# Patient Record
Sex: Male | Born: 1940 | Race: White | Hispanic: No | Marital: Married | Smoking: Never smoker
Health system: Southern US, Community
[De-identification: ages and names within clinical notes are randomized; demographics above are authoritative.]

## PROBLEM LIST (undated history)

## (undated) DIAGNOSIS — R351 Nocturia: Secondary | ICD-10-CM

## (undated) DIAGNOSIS — E119 Type 2 diabetes mellitus without complications: Secondary | ICD-10-CM

## (undated) DIAGNOSIS — T8859XA Other complications of anesthesia, initial encounter: Secondary | ICD-10-CM

## (undated) DIAGNOSIS — G4733 Obstructive sleep apnea (adult) (pediatric): Secondary | ICD-10-CM

## (undated) DIAGNOSIS — T4145XA Adverse effect of unspecified anesthetic, initial encounter: Secondary | ICD-10-CM

## (undated) DIAGNOSIS — R350 Frequency of micturition: Secondary | ICD-10-CM

## (undated) DIAGNOSIS — I440 Atrioventricular block, first degree: Secondary | ICD-10-CM

## (undated) DIAGNOSIS — G4730 Sleep apnea, unspecified: Secondary | ICD-10-CM

## (undated) DIAGNOSIS — Z5189 Encounter for other specified aftercare: Secondary | ICD-10-CM

## (undated) DIAGNOSIS — M1990 Unspecified osteoarthritis, unspecified site: Secondary | ICD-10-CM

## (undated) DIAGNOSIS — E785 Hyperlipidemia, unspecified: Secondary | ICD-10-CM

## (undated) DIAGNOSIS — Z974 Presence of external hearing-aid: Secondary | ICD-10-CM

## (undated) DIAGNOSIS — N200 Calculus of kidney: Secondary | ICD-10-CM

## (undated) DIAGNOSIS — I5032 Chronic diastolic (congestive) heart failure: Secondary | ICD-10-CM

## (undated) DIAGNOSIS — F4310 Post-traumatic stress disorder, unspecified: Secondary | ICD-10-CM

## (undated) DIAGNOSIS — Z87442 Personal history of urinary calculi: Secondary | ICD-10-CM

## (undated) DIAGNOSIS — I10 Essential (primary) hypertension: Secondary | ICD-10-CM

## (undated) DIAGNOSIS — I5189 Other ill-defined heart diseases: Secondary | ICD-10-CM

## (undated) DIAGNOSIS — Z973 Presence of spectacles and contact lenses: Secondary | ICD-10-CM

## (undated) DIAGNOSIS — I2510 Atherosclerotic heart disease of native coronary artery without angina pectoris: Secondary | ICD-10-CM

## (undated) DIAGNOSIS — N201 Calculus of ureter: Secondary | ICD-10-CM

## (undated) HISTORY — DX: Other ill-defined heart diseases: I51.89

## (undated) HISTORY — DX: Type 2 diabetes mellitus without complications: E11.9

## (undated) HISTORY — DX: Sleep apnea, unspecified: G47.30

## (undated) HISTORY — DX: Encounter for other specified aftercare: Z51.89

## (undated) HISTORY — PX: BLEPHAROPLASTY: SUR158

## (undated) HISTORY — PX: NEPHROSTOMY: SHX1014

## (undated) HISTORY — DX: Atherosclerotic heart disease of native coronary artery without angina pectoris: I25.10

## (undated) HISTORY — DX: Essential (primary) hypertension: I10

## (undated) HISTORY — PX: COLONOSCOPY: SHX174

## (undated) HISTORY — DX: Hyperlipidemia, unspecified: E78.5

## (undated) HISTORY — PX: EXTRACORPOREAL SHOCK WAVE LITHOTRIPSY: SHX1557

## (undated) HISTORY — PX: LITHOTRIPSY: SUR834

## (undated) HISTORY — DX: Obstructive sleep apnea (adult) (pediatric): G47.33

---

## 1996-06-28 DIAGNOSIS — Z955 Presence of coronary angioplasty implant and graft: Secondary | ICD-10-CM

## 1996-06-28 HISTORY — PX: CORONARY ANGIOPLASTY: SHX604

## 1996-06-28 HISTORY — DX: Presence of coronary angioplasty implant and graft: Z95.5

## 2000-07-11 ENCOUNTER — Ambulatory Visit (HOSPITAL_BASED_OUTPATIENT_CLINIC_OR_DEPARTMENT_OTHER): Admission: RE | Admit: 2000-07-11 | Discharge: 2000-07-11 | Payer: Self-pay | Admitting: Surgery

## 2004-04-07 ENCOUNTER — Ambulatory Visit (HOSPITAL_COMMUNITY): Admission: RE | Admit: 2004-04-07 | Discharge: 2004-04-07 | Payer: Self-pay | Admitting: Ophthalmology

## 2006-10-03 ENCOUNTER — Emergency Department (HOSPITAL_COMMUNITY): Admission: EM | Admit: 2006-10-03 | Discharge: 2006-10-03 | Payer: Self-pay | Admitting: Emergency Medicine

## 2006-10-17 ENCOUNTER — Ambulatory Visit: Payer: Self-pay | Admitting: Internal Medicine

## 2006-10-28 ENCOUNTER — Ambulatory Visit: Payer: Self-pay | Admitting: Internal Medicine

## 2011-01-20 ENCOUNTER — Ambulatory Visit: Payer: Self-pay | Admitting: Cardiology

## 2011-10-19 ENCOUNTER — Encounter: Payer: Self-pay | Admitting: *Deleted

## 2012-02-24 ENCOUNTER — Ambulatory Visit: Payer: Self-pay | Admitting: Cardiology

## 2012-03-02 ENCOUNTER — Encounter: Payer: Self-pay | Admitting: *Deleted

## 2012-04-18 ENCOUNTER — Encounter: Payer: Self-pay | Admitting: Cardiology

## 2012-12-26 ENCOUNTER — Telehealth: Payer: Self-pay | Admitting: Cardiology

## 2012-12-26 DIAGNOSIS — I2510 Atherosclerotic heart disease of native coronary artery without angina pectoris: Secondary | ICD-10-CM

## 2012-12-26 NOTE — Telephone Encounter (Signed)
Returned call to patient he stated last time he saw Dr.Jordan he recommended a stress test at next visit.Stated he would like a stress test and office visit in one visit.Patient was told will check with Dr.Jordan tomorrow and call him back.

## 2012-12-26 NOTE — Telephone Encounter (Signed)
New Problem:    Patient called in because he does not want to have his appointment without having a stress test that same day.  Please call back.

## 2013-01-01 NOTE — Telephone Encounter (Signed)
Returned call to patient Dr.Jordan advised needs a stress myoview before his appointment .Schedulers will call back to schedule.

## 2013-01-04 ENCOUNTER — Ambulatory Visit (HOSPITAL_COMMUNITY): Payer: Medicare Other | Attending: Cardiovascular Disease | Admitting: Radiology

## 2013-01-04 VITALS — BP 140/79 | HR 52 | Ht 66.0 in | Wt 178.0 lb

## 2013-01-04 DIAGNOSIS — I4949 Other premature depolarization: Secondary | ICD-10-CM

## 2013-01-04 DIAGNOSIS — Z8249 Family history of ischemic heart disease and other diseases of the circulatory system: Secondary | ICD-10-CM | POA: Insufficient documentation

## 2013-01-04 DIAGNOSIS — Z87891 Personal history of nicotine dependence: Secondary | ICD-10-CM | POA: Insufficient documentation

## 2013-01-04 DIAGNOSIS — I2510 Atherosclerotic heart disease of native coronary artery without angina pectoris: Secondary | ICD-10-CM

## 2013-01-04 DIAGNOSIS — I10 Essential (primary) hypertension: Secondary | ICD-10-CM | POA: Insufficient documentation

## 2013-01-04 DIAGNOSIS — I252 Old myocardial infarction: Secondary | ICD-10-CM | POA: Insufficient documentation

## 2013-01-04 DIAGNOSIS — Z9861 Coronary angioplasty status: Secondary | ICD-10-CM | POA: Insufficient documentation

## 2013-01-04 MED ORDER — REGADENOSON 0.4 MG/5ML IV SOLN
0.4000 mg | Freq: Once | INTRAVENOUS | Status: AC
  Administered 2013-01-04: 0.4 mg via INTRAVENOUS

## 2013-01-04 MED ORDER — TECHNETIUM TC 99M SESTAMIBI GENERIC - CARDIOLITE
10.0000 | Freq: Once | INTRAVENOUS | Status: AC | PRN
  Administered 2013-01-04: 10 via INTRAVENOUS

## 2013-01-04 MED ORDER — TECHNETIUM TC 99M SESTAMIBI GENERIC - CARDIOLITE
30.0000 | Freq: Once | INTRAVENOUS | Status: AC | PRN
  Administered 2013-01-04: 30 via INTRAVENOUS

## 2013-01-04 NOTE — Progress Notes (Signed)
[facility] SITE 3 NUCLEAR MED [address] [place], [place] [address] [phone]    Cardiology Nuclear Med Study  Jeffery Schmidt is a 72 y.o. male     MRN : [id number]     DOB: April 14, 1941  Procedure Date: 01/04/2013  Nuclear Med Background Indication for Stress Test:  Evaluation for Ischemia, PTCA Patency and FAA Physical History:  '98 MI>PTCA; '05 ION:GEXBMW; '10 MPS:no ischemia, EF=68% Cardiac Risk Factors: Family History - CAD, History of Smoking, Hypertension and Lipids  Symptoms:  No cardiac symptoms today.   Nuclear Pre-Procedure Caffeine/Decaff Intake:  None NPO After: 6:30pm   Lungs:  Clear. O2 Sat: 97% on room air. IV 0.9% NS with Angio Cath:  20g  IV Site: R Antecubital  IV Started by:  Stanton Kidney, EMT-P  Chest Size (in):  42 Cup Size: n/a  Height: 5\' 6"  (1.676 m)  Weight:  178 lb (80.74 kg)  BMI:  Body mass index is 28.74 kg/(m^2). Tech Comments:  Atenolol held > 24 hours, per patient.    Nuclear Med Study 1 or 2 day study: 1 day  Stress Test Type:  Treadmill/Lexiscan  Reading MD: Kristeen Miss, MD  Order Authorizing Provider:  Peter Swaziland, MD  Resting Radionuclide: Technetium 20m Sestamibi  Resting Radionuclide Dose: 11.0 mCi   Stress Radionuclide:  Technetium 44m Sestamibi  Stress Radionuclide Dose: 33.0 mCi           Stress Protocol Rest HR: 52 Stress HR: 112  Rest BP: 140/79 Stress BP: 220/69  Exercise Time (min): 10:00 METS: 10.6   Predicted Max HR: 148 bpm % Max HR: 75.68 bpm Rate Pressure Product: 41324   Dose of Adenosine (mg):  n/a Dose of Lexiscan: 0.4 mg  Dose of Atropine (mg): n/a Dose of Dobutamine: n/a mcg/kg/min (at max HR)  Stress Test Technologist: Smiley Houseman, CMA-N  Nuclear Technologist:  Doyne Keel, CNMT     Rest Procedure:  Myocardial perfusion imaging was performed at rest 45 minutes following the intravenous administration of Technetium 68m Sestamibi.  Rest ECG: NSR - Normal EKG  Stress Procedure: The  patient initially walked the treadmill utilizing the Bruce protocol for ten minutes, but was unable to reach his target heart rate.   He was then given IV Lexiscan 0.4 mg over 15-seconds with concurrent low level exercise and then Technetium 20m Sestamibi was injected at 30-seconds while the patient continued walking one more minute.  Quantitative spect images were obtained after a 45-minute delay.  Stress ECG: he developed PVCs.  he developed TWI in the lateral leads with exercise.   QPS Raw Data Images:  Normal; no motion artifact; normal heart/lung ratio. Stress Images:  Normal homogeneous uptake in all areas of the myocardium. Rest Images:  Normal homogeneous uptake in all areas of the myocardium. Subtraction (SDS):  No evidence of ischemia. Transient Ischemic Dilatation (Normal <1.22):  n/a Lung/Heart Ratio (Normal <0.45):  0.37  Quantitative Gated Spect Images QGS EDV:  75 ml QGS ESV:  24 ml  Impression Exercise Capacity:  Lexiscan with low level exercise. BP Response:  Hypertensive blood pressure response. Clinical Symptoms:  No significant symptoms noted. ECG Impression:  He developed mild T wave inversion with exercise  Comparison with Prior Nuclear Study: No significant change from previous study on 10/04/08  Overall Impression:  Low risk stress nuclear study .  He had and abnormal ECG response but this has been seen in the past with him.  the scintographic images show no  ischemia..  LV Ejection Fraction: 68%.  LV Wall Motion:  NL LV Function; NL Wall Motion.   Vesta Mixer, Montez Hageman., MD, [facility] 01/04/2013, 5:22 PM Office - [phone] Pager [phone]

## 2013-01-05 ENCOUNTER — Telehealth: Payer: Self-pay

## 2013-01-05 NOTE — Telephone Encounter (Signed)
Received copy of Nuclear Stress Test from [place].Patient needs copy for FAA.Patient called he will pick up copy on Monday 01/08/13.Copy left at front desk 3 rd floor.

## 2013-01-31 ENCOUNTER — Other Ambulatory Visit: Payer: Self-pay

## 2013-02-21 ENCOUNTER — Ambulatory Visit (INDEPENDENT_AMBULATORY_CARE_PROVIDER_SITE_OTHER): Payer: Medicare Other | Admitting: Cardiology

## 2013-02-21 ENCOUNTER — Encounter: Payer: Self-pay | Admitting: Cardiology

## 2013-02-21 VITALS — BP 133/69 | HR 52 | Ht 66.0 in | Wt 176.0 lb

## 2013-02-21 DIAGNOSIS — E785 Hyperlipidemia, unspecified: Secondary | ICD-10-CM

## 2013-02-21 DIAGNOSIS — I10 Essential (primary) hypertension: Secondary | ICD-10-CM | POA: Insufficient documentation

## 2013-02-21 DIAGNOSIS — I2510 Atherosclerotic heart disease of native coronary artery without angina pectoris: Secondary | ICD-10-CM | POA: Insufficient documentation

## 2013-02-21 DIAGNOSIS — I25119 Atherosclerotic heart disease of native coronary artery with unspecified angina pectoris: Secondary | ICD-10-CM | POA: Insufficient documentation

## 2013-02-21 NOTE — Progress Notes (Signed)
Jeffery Schmidt Date of Birth: April 17, 1941 Medical Record #[id number]  History of Present Illness: Jeffery Schmidt is seen for followup today. He has a history of coronary disease with remote PCI in 1998. He has done very well since then without any recurrent symptoms of chest pain, shortness of breath, orthopnea, palpitations, or dizziness. He remains active. He has a history of hypertension and hyperlipidemia which have been well-controlled. He denies any other new medical problems.   Medication List       This list is accurate as of: 02/21/13  1:46 PM.  Always use your most recent med list.               amLODipine 5 MG tablet  Commonly known as:  NORVASC  Take 5 mg by mouth daily.     aspirin 81 MG tablet  Take 81 mg by mouth daily.     atenolol 100 MG tablet  Commonly known as:  TENORMIN  Take 100 mg by mouth daily.     ezetimibe 10 MG tablet  Commonly known as:  ZETIA  Take 10 mg by mouth daily.     losartan-hydrochlorothiazide 100-25 MG per tablet  Commonly known as:  HYZAAR  Take 1 tablet by mouth daily.     pravastatin 20 MG tablet  Commonly known as:  PRAVACHOL  Take 1/2 tab daily         Allergies  Allergen Reactions  . Epinephrine     Past Medical History  Diagnosis Date  . CAD (coronary artery disease)   . HTN (hypertension)   . HLD (hyperlipidemia)     Past Surgical History  Procedure Laterality Date  . Coronary stent placement  1998  . Kidney surgery      kidney stone- nephrostomy    History  Smoking status  . Never Smoker   Smokeless tobacco  . Not on file    History  Alcohol Use: Not on file    Family History  Problem Relation Age of Onset  . Coronary artery disease    . Heart disease    . Hypertension    . Stroke    . Hypertension Mother     Review of Systems: As noted in history of present illness.  All other systems were reviewed and are negative.  Physical Exam: BP 133/69  Pulse 52  Ht 5\' 6"  (1.676 m)  Wt 176  lb (79.833 kg)  BMI 28.42 kg/m2 He is a pleasant white male in no acute distress. HEENT: Normocephalic, atraumatic. Pupils equal round and reactive. He wears glasses. Oropharynx is clear. Neck is without JVD, adenopathy, thyromegaly, or bruits. Lungs: Clear Cardiovascular: Regular rate and rhythm. Normal S1 and S2. No gallop, murmur, or click. Abdomen: Soft and nontender. No masses or bruits. No pedis thyromegaly. Extremities: No cyanosis or edema. Pulses are 2+ and symmetric. Skin: Warm and dry Neuro: Alert and oriented x3. Cranial nerves II through XII are intact. LABORATORY DATA: ECG: Normal sinus rhythm, normal. Laboratory data dated 04/19/2012: CBC is normal. Complete chemistry panel was normal with exception of a glucose of 121. Total cholesterol 159, trouble stressed to 5, HDL 38, LDL 80. Myoview study performed 01/04/2013 shows normal perfusion with ejection fraction 68%. Assessment / Plan: 1. Coronary disease with remote PCI. Patient is asymptomatic. Recent Myoview study was normal. We will continue current medical therapy with aspirin, beta blocker, and statin therapy. I'll followup again in one year. 2. Hypertension-well-controlled. Continue current antihypertensive therapy. 3. Hyperlipidemia controlled  on pravastatin.

## 2013-02-21 NOTE — Patient Instructions (Addendum)
Continue your current therapy  I will see you in one year   

## 2013-05-03 ENCOUNTER — Other Ambulatory Visit: Payer: Self-pay

## 2013-09-11 ENCOUNTER — Encounter: Payer: Self-pay | Admitting: Internal Medicine

## 2013-09-13 ENCOUNTER — Encounter: Payer: Self-pay | Admitting: Internal Medicine

## 2014-05-06 ENCOUNTER — Inpatient Hospital Stay (HOSPITAL_COMMUNITY)
Admission: EM | Admit: 2014-05-06 | Discharge: 2014-05-09 | DRG: 247 | Disposition: A | Discharge status: Home / Self Care | Payer: Medicare Other | Attending: Internal Medicine | Admitting: Internal Medicine

## 2014-05-06 ENCOUNTER — Encounter (HOSPITAL_COMMUNITY): Payer: Self-pay | Admitting: Emergency Medicine

## 2014-05-06 ENCOUNTER — Emergency Department (HOSPITAL_COMMUNITY): Payer: Medicare Other

## 2014-05-06 DIAGNOSIS — I25110 Atherosclerotic heart disease of native coronary artery with unstable angina pectoris: Secondary | ICD-10-CM | POA: Diagnosis not present

## 2014-05-06 DIAGNOSIS — E876 Hypokalemia: Secondary | ICD-10-CM | POA: Diagnosis not present

## 2014-05-06 DIAGNOSIS — Z955 Presence of coronary angioplasty implant and graft: Secondary | ICD-10-CM

## 2014-05-06 DIAGNOSIS — R079 Chest pain, unspecified: Secondary | ICD-10-CM

## 2014-05-06 DIAGNOSIS — I059 Rheumatic mitral valve disease, unspecified: Secondary | ICD-10-CM

## 2014-05-06 DIAGNOSIS — R4182 Altered mental status, unspecified: Secondary | ICD-10-CM | POA: Insufficient documentation

## 2014-05-06 DIAGNOSIS — Z79899 Other long term (current) drug therapy: Secondary | ICD-10-CM

## 2014-05-06 DIAGNOSIS — H9190 Unspecified hearing loss, unspecified ear: Secondary | ICD-10-CM | POA: Diagnosis present

## 2014-05-06 DIAGNOSIS — E785 Hyperlipidemia, unspecified: Secondary | ICD-10-CM | POA: Diagnosis present

## 2014-05-06 DIAGNOSIS — Z8249 Family history of ischemic heart disease and other diseases of the circulatory system: Secondary | ICD-10-CM

## 2014-05-06 DIAGNOSIS — I2510 Atherosclerotic heart disease of native coronary artery without angina pectoris: Secondary | ICD-10-CM | POA: Diagnosis present

## 2014-05-06 DIAGNOSIS — I25119 Atherosclerotic heart disease of native coronary artery with unspecified angina pectoris: Secondary | ICD-10-CM | POA: Diagnosis present

## 2014-05-06 DIAGNOSIS — R410 Disorientation, unspecified: Secondary | ICD-10-CM | POA: Diagnosis not present

## 2014-05-06 DIAGNOSIS — Z87891 Personal history of nicotine dependence: Secondary | ICD-10-CM

## 2014-05-06 DIAGNOSIS — Z7902 Long term (current) use of antithrombotics/antiplatelets: Secondary | ICD-10-CM

## 2014-05-06 DIAGNOSIS — I200 Unstable angina: Secondary | ICD-10-CM | POA: Diagnosis present

## 2014-05-06 DIAGNOSIS — R001 Bradycardia, unspecified: Secondary | ICD-10-CM | POA: Diagnosis not present

## 2014-05-06 DIAGNOSIS — Z823 Family history of stroke: Secondary | ICD-10-CM

## 2014-05-06 DIAGNOSIS — R51 Headache: Secondary | ICD-10-CM | POA: Diagnosis not present

## 2014-05-06 DIAGNOSIS — R451 Restlessness and agitation: Secondary | ICD-10-CM | POA: Diagnosis not present

## 2014-05-06 DIAGNOSIS — Z7982 Long term (current) use of aspirin: Secondary | ICD-10-CM

## 2014-05-06 DIAGNOSIS — I10 Essential (primary) hypertension: Secondary | ICD-10-CM | POA: Diagnosis present

## 2014-05-06 DIAGNOSIS — E1169 Type 2 diabetes mellitus with other specified complication: Secondary | ICD-10-CM | POA: Diagnosis present

## 2014-05-06 DIAGNOSIS — E1165 Type 2 diabetes mellitus with hyperglycemia: Secondary | ICD-10-CM | POA: Diagnosis present

## 2014-05-06 HISTORY — DX: Calculus of kidney: N20.0

## 2014-05-06 LAB — CBC
HEMATOCRIT: 40.8 % (ref 39.0–52.0)
HEMOGLOBIN: 14.5 g/dL (ref 13.0–17.0)
MCH: 32.2 pg (ref 26.0–34.0)
MCHC: 35.5 g/dL (ref 30.0–36.0)
MCV: 90.5 fL (ref 78.0–100.0)
Platelets: 185 10*3/uL (ref 150–400)
RBC: 4.51 MIL/uL (ref 4.22–5.81)
RDW: 12.7 % (ref 11.5–15.5)
WBC: 6 10*3/uL (ref 4.0–10.5)

## 2014-05-06 LAB — PROTIME-INR
INR: 0.9 (ref 0.00–1.49)
Prothrombin Time: 12.2 seconds (ref 11.6–15.2)

## 2014-05-06 LAB — TROPONIN I: Troponin I: <0.3 ng/mL (ref <0.30)

## 2014-05-06 LAB — BASIC METABOLIC PANEL
Anion gap: 16 — ABNORMAL HIGH (ref 5–15)
BUN: 22 mg/dL (ref 6–23)
CHLORIDE: 101 meq/L (ref 96–112)
CO2: 22 meq/L (ref 19–32)
Calcium: 9.2 mg/dL (ref 8.4–10.5)
Creatinine, Ser: 1 mg/dL (ref 0.50–1.35)
GFR calc Af Amer: 83 mL/min — ABNORMAL LOW (ref >90)
GFR, EST NON AFRICAN AMERICAN: 72 mL/min — AB (ref >90)
GLUCOSE: 214 mg/dL — AB (ref 70–99)
POTASSIUM: 3.7 meq/L (ref 3.7–5.3)
Sodium: 139 mEq/L (ref 137–147)

## 2014-05-06 LAB — HEMOGLOBIN A1C
Hgb A1c MFr Bld: 7.2 % — ABNORMAL HIGH (ref <5.7)
Mean Plasma Glucose: 160 mg/dL — ABNORMAL HIGH (ref <117)

## 2014-05-06 LAB — D-DIMER, QUANTITATIVE: D-Dimer, Quant: 0.33 ug/mL-FEU (ref 0.00–0.48)

## 2014-05-06 MED ORDER — SODIUM CHLORIDE 0.9 % IV SOLN: 250.0000 mL | INTRAVENOUS | Status: DC | PRN

## 2014-05-06 MED ORDER — LOSARTAN POTASSIUM 50 MG PO TABS
100.0000 mg | ORAL_TABLET | Freq: Every day | ORAL | Status: DC
Start: 2014-05-06 — End: 2014-05-06
  Administered 2014-05-06: 100 mg via ORAL
  Filled 2014-05-06: qty 2

## 2014-05-06 MED ORDER — ENOXAPARIN SODIUM 40 MG/0.4ML ~~LOC~~ SOLN
40.0000 mg | SUBCUTANEOUS | Status: DC
  Administered 2014-05-06: 40 mg via SUBCUTANEOUS
  Filled 2014-05-06 (×2): qty 0.4

## 2014-05-06 MED ORDER — ASPIRIN EC 81 MG PO TBEC
81.0000 mg | DELAYED_RELEASE_TABLET | Freq: Every day | ORAL | Status: DC
  Administered 2014-05-06: 81 mg via ORAL
  Filled 2014-05-06: qty 1

## 2014-05-06 MED ORDER — ASPIRIN 325 MG PO TABS
325.0000 mg | ORAL_TABLET | Freq: Once | ORAL | Status: AC
  Administered 2014-05-06: 325 mg via ORAL
  Filled 2014-05-06: qty 1

## 2014-05-06 MED ORDER — NITROGLYCERIN 0.4 MG SL SUBL: 0.4000 mg | SUBLINGUAL_TABLET | SUBLINGUAL | Status: DC | PRN

## 2014-05-06 MED ORDER — SODIUM CHLORIDE 0.9 % IJ SOLN: 3.0000 mL | INTRAMUSCULAR | Status: DC | PRN

## 2014-05-06 MED ORDER — ONDANSETRON HCL 4 MG/2ML IJ SOLN
4.0000 mg | Freq: Four times a day (QID) | INTRAMUSCULAR | Status: DC | PRN
  Administered 2014-05-07: 22:00:00 4 mg via INTRAVENOUS
  Filled 2014-05-06: qty 2

## 2014-05-06 MED ORDER — EZETIMIBE 10 MG PO TABS
10.0000 mg | ORAL_TABLET | Freq: Every day | ORAL | Status: DC
Start: 2014-05-06 — End: 2014-05-09
  Administered 2014-05-06 – 2014-05-09 (×3): 10 mg via ORAL
  Filled 2014-05-06 (×3): qty 1

## 2014-05-06 MED ORDER — ACETAMINOPHEN 325 MG PO TABS
650.0000 mg | ORAL_TABLET | ORAL | Status: DC | PRN
  Administered 2014-05-07: 650 mg via ORAL
  Filled 2014-05-06: qty 2

## 2014-05-06 MED ORDER — LOSARTAN POTASSIUM-HCTZ 100-25 MG PO TABS: 1.0000 | ORAL_TABLET | Freq: Every day | ORAL | Status: DC

## 2014-05-06 MED ORDER — ASPIRIN 81 MG PO CHEW
81.0000 mg | CHEWABLE_TABLET | ORAL | Status: AC
  Administered 2014-05-07: 81 mg via ORAL
  Filled 2014-05-06: qty 1

## 2014-05-06 MED ORDER — HEPARIN (PORCINE) IN NACL 100-0.45 UNIT/ML-% IJ SOLN
1000.0000 [IU]/h | INTRAMUSCULAR | Status: DC
  Administered 2014-05-06: 1000 [IU]/h via INTRAVENOUS
  Filled 2014-05-06 (×2): qty 250

## 2014-05-06 MED ORDER — NITROGLYCERIN 2 % TD OINT
0.5000 [in_us] | TOPICAL_OINTMENT | Freq: Four times a day (QID) | TRANSDERMAL | Status: DC
  Administered 2014-05-06 – 2014-05-07 (×4): 0.5 [in_us] via TOPICAL
  Filled 2014-05-06: qty 30

## 2014-05-06 MED ORDER — SODIUM CHLORIDE 0.9 % IV SOLN
1.0000 mL/kg/h | INTRAVENOUS | Status: DC
  Administered 2014-05-07 (×2): 1 mL/kg/h via INTRAVENOUS

## 2014-05-06 MED ORDER — ATENOLOL 100 MG PO TABS
100.0000 mg | ORAL_TABLET | Freq: Every day | ORAL | Status: DC
  Administered 2014-05-06 – 2014-05-09 (×4): 100 mg via ORAL
  Filled 2014-05-06: qty 1
  Filled 2014-05-06: qty 2
  Filled 2014-05-06: qty 1
  Filled 2014-05-06: qty 4

## 2014-05-06 MED ORDER — AMLODIPINE BESYLATE 5 MG PO TABS
5.0000 mg | ORAL_TABLET | Freq: Every day | ORAL | Status: DC
  Administered 2014-05-06 – 2014-05-09 (×4): 5 mg via ORAL
  Filled 2014-05-06 (×4): qty 1

## 2014-05-06 MED ORDER — MORPHINE SULFATE 2 MG/ML IJ SOLN: 2.0000 mg | INTRAMUSCULAR | Status: DC | PRN

## 2014-05-06 MED ORDER — ASPIRIN 325 MG PO TABS
325.0000 mg | ORAL_TABLET | Freq: Every day | ORAL | Status: DC
  Administered 2014-05-07: 325 mg via ORAL
  Filled 2014-05-06: qty 1

## 2014-05-06 MED ORDER — SODIUM CHLORIDE 0.9 % IJ SOLN
3.0000 mL | Freq: Two times a day (BID) | INTRAMUSCULAR | Status: DC
  Administered 2014-05-06: 3 mL via INTRAVENOUS

## 2014-05-06 MED ORDER — PRAVASTATIN SODIUM 20 MG PO TABS
10.0000 mg | ORAL_TABLET | Freq: Every day | ORAL | Status: DC
  Filled 2014-05-06: qty 1
  Filled 2014-05-06: qty 0.5
  Filled 2014-05-06 (×2): qty 1

## 2014-05-06 MED ORDER — HYDROCHLOROTHIAZIDE 25 MG PO TABS
25.0000 mg | ORAL_TABLET | Freq: Every day | ORAL | Status: DC
  Administered 2014-05-06 – 2014-05-09 (×3): 25 mg via ORAL
  Filled 2014-05-06 (×5): qty 1

## 2014-05-06 NOTE — ED Provider Notes (Signed)
CSN: [id number]     Arrival date & time 05/06/14  0305 History   First MD Initiated Contact with Patient 05/06/14 0310     Chief Complaint  Patient presents with  . Chest Pain     (Consider location/radiation/quality/duration/timing/severity/associated sxs/prior Treatment) HPI  Jeffery Schmidt is a 73 y.o. male with past medical history of coronary artery disease, hypertension, hyperlipidemia coming in with chest pain. Patient stateschest pain is a burning sensation that his mid sternum. This began 2 days ago after eating food and he thought it was related to that. However yesterday when he went on his walk he again had episode of pain. Patient noticed that every time he exerted himself the pain came back, it was relieved with rest. He denied any shortness of breath vomiting or diaphoresis associated with it. He states this is exactly how he felt when he required an angioplasty 10 years ago. He denies any recent fevers or productive cough, chills. Has no further complaints.  10 Systems reviewed and are negative for acute change except as noted in the HPI.     Past Medical History  Diagnosis Date  . CAD (coronary artery disease)   . HTN (hypertension)   . HLD (hyperlipidemia)   . Kidney stones    Past Surgical History  Procedure Laterality Date  . Coronary stent placement  1998  . Kidney surgery      kidney stone- nephrostomy   Family History  Problem Relation Age of Onset  . Coronary artery disease    . Heart disease    . Hypertension    . Stroke    . Hypertension Mother    History  Substance Use Topics  . Smoking status: Never Smoker   . Smokeless tobacco: Not on file  . Alcohol Use: No    Review of Systems    Allergies  Epinephrine  Home Medications   Prior to Admission medications   Medication Sig Start Date End Date Taking? Authorizing Provider  amLODipine (NORVASC) 5 MG tablet Take 5 mg by mouth daily.    Historical Provider, MD  aspirin 81 MG tablet  Take 81 mg by mouth daily.    Historical Provider, MD  atenolol (TENORMIN) 100 MG tablet Take 100 mg by mouth daily.    Historical Provider, MD  ezetimibe (ZETIA) 10 MG tablet Take 10 mg by mouth daily.    Historical Provider, MD  losartan-hydrochlorothiazide (HYZAAR) 100-25 MG per tablet Take 1 tablet by mouth daily.    Historical Provider, MD  pravastatin (PRAVACHOL) 20 MG tablet Take 1/2 tab daily    Historical Provider, MD   BP 147/70 mmHg  Pulse 60  Temp(Src) 97.4 F (36.3 C) (Oral)  Resp 16  Ht 5\' 6"  (1.676 m)  Wt 179 lb (81.194 kg)  BMI 28.91 kg/m2  SpO2 96% Physical Exam  Constitutional: He is oriented to person, place, and time. Vital signs are normal. He appears well-developed and well-nourished.  Non-toxic appearance. He does not appear ill. No distress.  HENT:  Head: Normocephalic and atraumatic.  Nose: Nose normal.  Mouth/Throat: Oropharynx is clear and moist. No oropharyngeal exudate.  Eyes: Conjunctivae and EOM are normal. Pupils are equal, round, and reactive to light. No scleral icterus.  Neck: Normal range of motion. Neck supple. No tracheal deviation, no edema, no erythema and normal range of motion present. No thyroid mass and no thyromegaly present.  Cardiovascular: Normal rate, regular rhythm, S1 normal, S2 normal, normal heart sounds, intact distal pulses  and normal pulses.  Exam reveals no gallop and no friction rub.   No murmur heard. Pulses:      Radial pulses are 2+ on the right side, and 2+ on the left side.       Dorsalis pedis pulses are 2+ on the right side, and 2+ on the left side.  Pulmonary/Chest: Effort normal and breath sounds normal. No respiratory distress. He has no wheezes. He has no rhonchi. He has no rales.  Abdominal: Soft. Normal appearance and bowel sounds are normal. He exhibits no distension, no ascites and no mass. There is no hepatosplenomegaly. There is no tenderness. There is no rebound, no guarding and no CVA tenderness.   Musculoskeletal: Normal range of motion. He exhibits no edema or tenderness.  Lymphadenopathy:    He has no cervical adenopathy.  Neurological: He is alert and oriented to person, place, and time. He has normal strength. No cranial nerve deficit or sensory deficit. He exhibits normal muscle tone. GCS eye subscore is 4. GCS verbal subscore is 5. GCS motor subscore is 6.  Skin: Skin is warm, dry and intact. No petechiae and no rash noted. He is not diaphoretic. No erythema. No pallor.  Psychiatric: He has a normal mood and affect. His behavior is normal. Judgment normal.  Nursing note and vitals reviewed.   ED Course  Procedures (including critical care time) Labs Review Labs Reviewed  BASIC METABOLIC PANEL - Abnormal; Notable for the following:    Glucose, Bld 214 (*)    GFR calc non Af Amer 72 (*)    GFR calc Af Amer 83 (*)    Anion gap 16 (*)    All other components within normal limits  CBC  PROTIME-INR  TROPONIN I  I-STAT TROPOININ, ED  I-STAT TROPOININ, ED    Imaging Review Dg Chest 2 View  05/06/2014   CLINICAL DATA:  Burning of right chest pain.  EXAM: CHEST  2 VIEW  COMPARISON:  PA and lateral chest 10/03/2006.  FINDINGS: Lung volumes are low but the lungs are clear with a punctate calcified granuloma in the right middle lobe unchanged. Heart size is normal. No pneumothorax or pleural effusion.  IMPRESSION: No acute finding in a low volume chest.   Electronically Signed   By: Inge Rise M.D.   On: 05/06/2014 03:54     EKG Interpretation None    MUSE not working: NSR, rate 61,1st degree A-V block, poor R-wave progression, abnormal EKG, no significant change since last tracing.  MDM   Final diagnoses:  Chest pain    Patient presents emergency department out of concern for pain. Although his story is very atypical, this can still be ACS especially in the early patient. He also states this feels just like his prior need for angioplasty. Full dose aspirin was  given.Emergency workup is negative, patient will be admitted to [facility] hospitalist for continued treatment.  Troponin is negative, patient will be admitted to hospitalist service, tele, for continued treatment.   Everlene Balls, MD 05/06/14 0500

## 2014-05-06 NOTE — ED Notes (Signed)
No chest

## 2014-05-06 NOTE — ED Notes (Signed)
Pt handed an urinal to use
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
## 2014-05-06 NOTE — Progress Notes (Addendum)
Pt seen and examined, admitted earlier this am per Dr.Patel with intermittent chest pain with exertion, h/o CAD and PTCA in 1998 and normal myoview in 7/14 Remains NPO, Cards consulted this am Cardiac enzymes and d dimer negative Hyperglycemia, no h/o DM, check hbaic  Domenic Polite, MD [phone]

## 2014-05-06 NOTE — ED Notes (Signed)
To x-ray

## 2014-05-06 NOTE — Progress Notes (Signed)
  Echocardiogram 2D Echocardiogram has been performed.  Lysle Rubens 05/06/2014, 9:24 AM

## 2014-05-06 NOTE — ED Notes (Signed)
The pt returned from xray.  Wife at the bedside.  Aspirin given.  No distress

## 2014-05-06 NOTE — ED Notes (Signed)
The pt has had rt sided chest pain he describes as a burning sensation for 2-3 days.  Worse with ambulation.  His pain increased   While being walked back to a treatment room

## 2014-05-06 NOTE — ED Notes (Signed)
Pt remains monitored by blood pressure, pulse ox, and 12 lead. Pts family remains at bedside.  

## 2014-05-06 NOTE — ED Notes (Signed)
When pt falls asleep on back, has periods of apnea-- alarms on, family and patient discussed their concerns, requesting sleep study at some point-- encouraged to ask admitting doctor.

## 2014-05-06 NOTE — ED Notes (Signed)
Pt remains monitored by blood pressure, pulse ox, and 5 lead. Pts family remains at bedside.  

## 2014-05-06 NOTE — ED Notes (Signed)
Pt has no pain   Waiting on a bed assignment

## 2014-05-06 NOTE — ED Notes (Signed)
Waiting for lab results.

## 2014-05-06 NOTE — ED Notes (Signed)
Pharmacy contacted for daily medications

## 2014-05-06 NOTE — Progress Notes (Signed)
ANTICOAGULATION CONSULT NOTE - Initial Consult  Pharmacy Consult for heparin Indication: chest pain/ACS  Allergies  Allergen Reactions  . Epinephrine     unknown    Patient Measurements: Height: 5\' 6"  (167.6 cm) Weight: 179 lb (81.194 kg) IBW/kg (Calculated) : 63.8 Heparin Dosing Weight: 74kg  Vital Signs: Temp: 98 F (36.7 C) (11/09 0640) BP: 132/69 mmHg (11/09 1547) Pulse Rate: 56 (11/09 1547)  Labs:  Recent Labs  05/06/14 0318 05/06/14 0334 05/06/14 0407 05/06/14 1055  HGB 14.5  --   --   --   HCT 40.8  --   --   --   PLT 185  --   --   --   LABPROT  --  12.2  --   --   INR  --  0.90  --   --   CREATININE 1.00  --   --   --   TROPONINI  --   --  <0.30 <0.30    Estimated Creatinine Clearance: 64.9 mL/min (by C-G formula based on Cr of 1).   Medical History: Past Medical History  Diagnosis Date  . CAD (coronary artery disease)     a. 1998 s/p PCI;  b. 12/2012 MV: EF 68%, no ischemia.  [name] HTN (hypertension)   . HLD (hyperlipidemia)   . Kidney stones    Assessment: 73 year old male admitted for chest pain. He has a PMH of CAD, hypertension, and hyperlipidemia and is being treated accordingly including beta blocker, statin, aspirin. Cardiac enzymes appear to be normal x2. Sq lovenox given this am, new orders to start IV heparin for possible ACS. Patient is not on anticoagulants pta. CBC was normal this am.   Goal of Therapy:  Heparin level 0.3-0.7 units/ml Monitor platelets by anticoagulation protocol: Yes   Plan:  No heparin bolus with lovenox given this morning Start heparin infusion at 1000 units/hr Check anti-Xa level in 8 hours and daily while on heparin Continue to monitor H&H and platelets  Erin Hearing PharmD., BCPS Clinical Pharmacist Pager [phone] 05/06/2014 4:17 PM

## 2014-05-06 NOTE — Progress Notes (Signed)
UR completed 

## 2014-05-06 NOTE — H&P (Signed)
[facility] History and Physical  Patient: Jeffery Schmidt  TGP:[id number]  DOB: Jul 15, 1940  DOS: the patient was seen and examined on 05/06/2014 PCP: Criselda Peaches, MD  Chief Complaint: chest pain  HPI: Jeffery Schmidt is a 73 y.o. male with Past medical history of CAD, HTN, HLD. The patient is presenting with complaints of chest pain. He mentions that he was in [place] Saturday and was driving back to [place] at which time he started having complaints of right-sided burning chest pain after having the  Meal. He mentions that the pain occurred on and off throughout the day on Saturday as well as and Sunday. And since it reoccurred on Sunday he decided to come to the hospital for further workup. The pain he describes as substernal 2 on the right side feels like burning crushing pain associated with exertion and improved with rest. He denies any nausea vomiting fever or chills shortness of breath diarrhea constipation abdominal pain and acid reflux. He has always had some swelling in his legs but denies any leg tenderness. No recent change in his medication reported.  The patient is coming from home And at his baseline independent for most of his ADL.  Review of Systems: as mentioned in the history of present illness.  A Comprehensive review of the other systems is negative.  Past Medical History  Diagnosis Date  . CAD (coronary artery disease)   . HTN (hypertension)   . HLD (hyperlipidemia)   . Kidney stones    Past Surgical History  Procedure Laterality Date  . Coronary stent placement  1998  . Kidney surgery      kidney stone- nephrostomy   Social History:  reports that he has never smoked. He does not have any smokeless tobacco history on file. He reports that he does not drink alcohol. His drug history is not on file.  Allergies  Allergen Reactions  . Epinephrine     unknown    Family History  Problem Relation Age of Onset  . Coronary artery  disease    . Heart disease    . Hypertension    . Stroke    . Hypertension Mother     Prior to Admission medications   Medication Sig Start Date End Date Taking? Authorizing Provider  amLODipine (NORVASC) 5 MG tablet Take 5 mg by mouth daily.   Yes Historical Provider, MD  Ascorbic Acid (VITAMIN C PO) Take 1 tablet by mouth daily.   Yes Historical Provider, MD  aspirin 81 MG tablet Take 81 mg by mouth daily.   Yes Historical Provider, MD  atenolol (TENORMIN) 100 MG tablet Take 100 mg by mouth daily.   Yes Historical Provider, MD  ezetimibe (ZETIA) 10 MG tablet Take 10 mg by mouth daily.   Yes Historical Provider, MD  Glucos-Chondroit-Hyaluron-MSM (GLUCOSAMINE CHONDROITIN JOINT PO) Take 1 tablet by mouth daily.   Yes Historical Provider, MD  losartan-hydrochlorothiazide (HYZAAR) 100-25 MG per tablet Take 1 tablet by mouth daily.   Yes Historical Provider, MD  omega-3 acid ethyl esters (LOVAZA) 1 G capsule Take 1 g by mouth daily.   Yes Historical Provider, MD  pravastatin (PRAVACHOL) 20 MG tablet Take 10 mg by mouth daily.   Yes Historical Provider, MD  pravastatin (PRAVACHOL) 20 MG tablet Take 1/2 tab daily    Historical Provider, MD    Physical Exam: Filed Vitals:   05/06/14 0349 05/06/14 0400 05/06/14 0445 05/06/14 0514  BP: 138/68 140/68 139/67 137/67  Pulse: 60 56 59 56  Temp:      TempSrc:      Resp: 18 16 14 18   Height:      Weight:      SpO2: 94% 94% 92% 94%    General: Alert, Awake and Oriented to Time, Place and Person. Appear in mild distress Eyes: PERRL ENT: Oral Mucosa clear moist. Neck: no JVD Cardiovascular: S1 and S2 Present, no Murmur, Peripheral Pulses Present Respiratory: Bilateral Air entry equal and Decreased, Clear to Auscultation, noCrackles, no wheezes Abdomen: Bowel Sound present, Soft and non tender Skin: no Rash Extremities: trace Pedal edema, no calf tenderness Neurologic: Grossly no focal neuro deficit.  Labs on Admission:  CBC:  Recent  Labs Lab 05/06/14 0318  WBC 6.0  HGB 14.5  HCT 40.8  MCV 90.5  PLT 185    CMP     Component Value Date/Time   NA 139 05/06/2014 0318   K 3.7 05/06/2014 0318   CL 101 05/06/2014 0318   CO2 22 05/06/2014 0318   GLUCOSE 214* 05/06/2014 0318   BUN 22 05/06/2014 0318   CREATININE 1.00 05/06/2014 0318   CALCIUM 9.2 05/06/2014 0318   GFRNONAA 72* 05/06/2014 0318   GFRAA 83* 05/06/2014 0318    No results for input(s): LIPASE, AMYLASE in the last 168 hours. No results for input(s): AMMONIA in the last 168 hours.   Recent Labs Lab 05/06/14 0407  TROPONINI <0.30   BNP (last 3 results) No results for input(s): PROBNP in the last 8760 hours.  Radiological Exams on Admission: Dg Chest 2 View  05/06/2014   CLINICAL DATA:  Burning of right chest pain.  EXAM: CHEST  2 VIEW  COMPARISON:  PA and lateral chest 10/03/2006.  FINDINGS: Lung volumes are low but the lungs are clear with a punctate calcified granuloma in the right middle lobe unchanged. Heart size is normal. No pneumothorax or pleural effusion.  IMPRESSION: No acute finding in a low volume chest.   Electronically Signed   By: Inge Rise M.D.   On: 05/06/2014 03:54    EKG: Independently reviewed. normal sinus rhythm, nonspecific ST and T waves changes.  Assessment/Plan Principal Problem:   Chest pain Active Problems:   CAD (coronary artery disease)   HTN (hypertension)   HLD (hyperlipidemia)   1. Chest pain The patient is presenting with complaints of chest pain. His symptoms are atypical but he has significant history of coronary artery disease. Stress test in 2014 was negative. With this the patient will be admitted in the hospital for further workup. I would keep him nothing by mouth except medication. Follow serial troponin and monitor him on telemetry. All pain echocardiogram. I would also check d-dimer since the patient has been driving 4 hours. Further workup depending on the results. At present continuing  his home medications.  Advance goals of care discussion: full code   DVT Prophylaxis: subcutaneous Heparin Nutrition: npo except medication  Disposition: Admitted to observation in telemetry unit.  Author: Berle Mull, MD [facility] Pager: [phone] 05/06/2014, 5:59 AM    If 7PM-7AM, please contact night-coverage [email or web address] Password [email or web address]

## 2014-05-06 NOTE — ED Notes (Signed)
Pt. reports mid chest pain with SOB onset yesterday worse when exerting effort , denies nausea or diaphoresis . No cough or congestion ,history of CAD his cardiologist is Dr. Peter Martinique.

## 2014-05-07 ENCOUNTER — Ambulatory Visit (HOSPITAL_COMMUNITY): Admission: RE | Admit: 2014-05-07 | Payer: Medicare Other | Source: Ambulatory Visit | Admitting: Cardiology

## 2014-05-07 ENCOUNTER — Other Ambulatory Visit: Payer: Self-pay

## 2014-05-07 ENCOUNTER — Inpatient Hospital Stay (HOSPITAL_COMMUNITY): Payer: Medicare Other

## 2014-05-07 ENCOUNTER — Encounter (HOSPITAL_COMMUNITY)
Admission: EM | Disposition: A | Discharge status: Home / Self Care | Payer: Medicare Other | Source: Home / Self Care | Attending: Internal Medicine

## 2014-05-07 DIAGNOSIS — R410 Disorientation, unspecified: Secondary | ICD-10-CM | POA: Diagnosis not present

## 2014-05-07 DIAGNOSIS — Z7982 Long term (current) use of aspirin: Secondary | ICD-10-CM | POA: Diagnosis not present

## 2014-05-07 DIAGNOSIS — Z87891 Personal history of nicotine dependence: Secondary | ICD-10-CM | POA: Diagnosis not present

## 2014-05-07 DIAGNOSIS — R451 Restlessness and agitation: Secondary | ICD-10-CM | POA: Diagnosis not present

## 2014-05-07 DIAGNOSIS — R001 Bradycardia, unspecified: Secondary | ICD-10-CM | POA: Diagnosis not present

## 2014-05-07 DIAGNOSIS — I10 Essential (primary) hypertension: Secondary | ICD-10-CM | POA: Diagnosis present

## 2014-05-07 DIAGNOSIS — Z955 Presence of coronary angioplasty implant and graft: Secondary | ICD-10-CM | POA: Diagnosis not present

## 2014-05-07 DIAGNOSIS — E785 Hyperlipidemia, unspecified: Secondary | ICD-10-CM | POA: Diagnosis present

## 2014-05-07 DIAGNOSIS — I200 Unstable angina: Secondary | ICD-10-CM

## 2014-05-07 DIAGNOSIS — I25110 Atherosclerotic heart disease of native coronary artery with unstable angina pectoris: Secondary | ICD-10-CM | POA: Diagnosis present

## 2014-05-07 DIAGNOSIS — E1169 Type 2 diabetes mellitus with other specified complication: Secondary | ICD-10-CM | POA: Diagnosis present

## 2014-05-07 DIAGNOSIS — Z8249 Family history of ischemic heart disease and other diseases of the circulatory system: Secondary | ICD-10-CM | POA: Diagnosis not present

## 2014-05-07 DIAGNOSIS — E876 Hypokalemia: Secondary | ICD-10-CM | POA: Diagnosis not present

## 2014-05-07 DIAGNOSIS — E1165 Type 2 diabetes mellitus with hyperglycemia: Secondary | ICD-10-CM | POA: Diagnosis present

## 2014-05-07 DIAGNOSIS — Z7902 Long term (current) use of antithrombotics/antiplatelets: Secondary | ICD-10-CM | POA: Diagnosis not present

## 2014-05-07 DIAGNOSIS — Z79899 Other long term (current) drug therapy: Secondary | ICD-10-CM | POA: Diagnosis not present

## 2014-05-07 DIAGNOSIS — R51 Headache: Secondary | ICD-10-CM | POA: Diagnosis not present

## 2014-05-07 DIAGNOSIS — I209 Angina pectoris, unspecified: Secondary | ICD-10-CM

## 2014-05-07 DIAGNOSIS — Z823 Family history of stroke: Secondary | ICD-10-CM | POA: Diagnosis not present

## 2014-05-07 DIAGNOSIS — F05 Delirium due to known physiological condition: Secondary | ICD-10-CM

## 2014-05-07 DIAGNOSIS — R079 Chest pain, unspecified: Secondary | ICD-10-CM | POA: Diagnosis present

## 2014-05-07 DIAGNOSIS — H9190 Unspecified hearing loss, unspecified ear: Secondary | ICD-10-CM | POA: Diagnosis present

## 2014-05-07 HISTORY — PX: LEFT HEART CATHETERIZATION WITH CORONARY ANGIOGRAM: SHX5451

## 2014-05-07 HISTORY — DX: Presence of coronary angioplasty implant and graft: Z95.5

## 2014-05-07 HISTORY — PX: PERCUTANEOUS CORONARY STENT INTERVENTION (PCI-S): SHX5485

## 2014-05-07 HISTORY — PX: CORONARY ANGIOPLASTY WITH STENT PLACEMENT: SHX49

## 2014-05-07 LAB — CBC
HCT: 38.7 % — ABNORMAL LOW (ref 39.0–52.0)
Hemoglobin: 13.6 g/dL (ref 13.0–17.0)
MCH: 31.4 pg (ref 26.0–34.0)
MCHC: 35.1 g/dL (ref 30.0–36.0)
MCV: 89.4 fL (ref 78.0–100.0)
PLATELETS: 166 10*3/uL (ref 150–400)
RBC: 4.33 MIL/uL (ref 4.22–5.81)
RDW: 12.7 % (ref 11.5–15.5)
WBC: 5.4 10*3/uL (ref 4.0–10.5)

## 2014-05-07 LAB — CK TOTAL AND CKMB (NOT AT ARMC)
CK, MB: 1.2 ng/mL (ref 0.3–4.0)
RELATIVE INDEX: INVALID (ref 0.0–2.5)
Total CK: 52 U/L (ref 7–232)

## 2014-05-07 LAB — BASIC METABOLIC PANEL
ANION GAP: 16 — AB (ref 5–15)
BUN: 22 mg/dL (ref 6–23)
CALCIUM: 8.9 mg/dL (ref 8.4–10.5)
CO2: 24 mEq/L (ref 19–32)
CREATININE: 1.08 mg/dL (ref 0.50–1.35)
Chloride: 101 mEq/L (ref 96–112)
GFR, EST AFRICAN AMERICAN: 76 mL/min — AB (ref >90)
GFR, EST NON AFRICAN AMERICAN: 66 mL/min — AB (ref >90)
Glucose, Bld: 148 mg/dL — ABNORMAL HIGH (ref 70–99)
Potassium: 3.5 mEq/L — ABNORMAL LOW (ref 3.7–5.3)
SODIUM: 141 meq/L (ref 137–147)

## 2014-05-07 LAB — HEPARIN LEVEL (UNFRACTIONATED)
HEPARIN UNFRACTIONATED: 0.37 [IU]/mL (ref 0.30–0.70)
Heparin Unfractionated: 0.44 IU/mL (ref 0.30–0.70)

## 2014-05-07 LAB — POCT ACTIVATED CLOTTING TIME: Activated Clotting Time: 551 seconds

## 2014-05-07 SURGERY — LEFT HEART CATHETERIZATION WITH CORONARY ANGIOGRAM
Anesthesia: LOCAL

## 2014-05-07 MED ORDER — MIDAZOLAM HCL 2 MG/2ML IJ SOLN
INTRAMUSCULAR | Status: AC
  Filled 2014-05-07: qty 2

## 2014-05-07 MED ORDER — HEPARIN (PORCINE) IN NACL 2-0.9 UNIT/ML-% IJ SOLN
INTRAMUSCULAR | Status: AC
Start: 2014-05-07 — End: 2014-05-07
  Filled 2014-05-07: qty 1500

## 2014-05-07 MED ORDER — SODIUM CHLORIDE 0.9 % IV SOLN: 1.0000 mL/kg/h | INTRAVENOUS | Status: AC

## 2014-05-07 MED ORDER — ATORVASTATIN CALCIUM 80 MG PO TABS
80.0000 mg | ORAL_TABLET | Freq: Every day | ORAL | Status: DC
  Administered 2014-05-08: 80 mg via ORAL
  Filled 2014-05-07 (×3): qty 1

## 2014-05-07 MED ORDER — LORAZEPAM 2 MG/ML IJ SOLN
0.5000 mg | Freq: Once | INTRAMUSCULAR | Status: DC
  Filled 2014-05-07: qty 1

## 2014-05-07 MED ORDER — PRASUGREL HCL 10 MG PO TABS
10.0000 mg | ORAL_TABLET | Freq: Every day | ORAL | Status: DC
  Filled 2014-05-07: qty 1

## 2014-05-07 MED ORDER — LIVING WELL WITH DIABETES BOOK
Freq: Once | Status: AC
  Administered 2014-05-07: 1
  Filled 2014-05-07: qty 1

## 2014-05-07 MED ORDER — PRAVASTATIN SODIUM 10 MG PO TABS: 10.0000 mg | ORAL_TABLET | Freq: Every day | ORAL | Status: DC

## 2014-05-07 MED ORDER — NITROGLYCERIN 1 MG/10 ML FOR IR/CATH LAB
INTRA_ARTERIAL | Status: AC
  Filled 2014-05-07: qty 10

## 2014-05-07 MED ORDER — BIVALIRUDIN 250 MG IV SOLR
INTRAVENOUS | Status: AC
  Filled 2014-05-07: qty 250

## 2014-05-07 MED ORDER — HYDRALAZINE HCL 20 MG/ML IJ SOLN
10.0000 mg | Freq: Four times a day (QID) | INTRAMUSCULAR | Status: DC | PRN
  Administered 2014-05-07 – 2014-05-08 (×2): 10 mg via INTRAVENOUS
  Filled 2014-05-07 (×2): qty 1

## 2014-05-07 MED ORDER — SODIUM CHLORIDE 0.9 % IV SOLN
1.7500 mg/kg/h | INTRAVENOUS | Status: AC
  Administered 2014-05-07: 1.75 mg/kg/h via INTRAVENOUS
  Filled 2014-05-07: qty 250

## 2014-05-07 MED ORDER — HEPARIN SODIUM (PORCINE) 1000 UNIT/ML IJ SOLN
INTRAMUSCULAR | Status: AC
  Filled 2014-05-07: qty 1

## 2014-05-07 MED ORDER — LIDOCAINE HCL (PF) 1 % IJ SOLN
INTRAMUSCULAR | Status: AC
  Filled 2014-05-07: qty 30

## 2014-05-07 MED ORDER — LORAZEPAM 2 MG/ML IJ SOLN
1.0000 mg | Freq: Once | INTRAMUSCULAR | Status: AC
  Administered 2014-05-07: 22:00:00 1 mg via INTRAVENOUS

## 2014-05-07 MED ORDER — PRASUGREL HCL 10 MG PO TABS
ORAL_TABLET | ORAL | Status: AC
  Filled 2014-05-07: qty 6

## 2014-05-07 MED ORDER — ASPIRIN 81 MG PO CHEW
81.0000 mg | CHEWABLE_TABLET | Freq: Every day | ORAL | Status: DC
  Administered 2014-05-08 – 2014-05-09 (×2): 81 mg via ORAL
  Filled 2014-05-07 (×2): qty 1

## 2014-05-07 MED ORDER — FENTANYL CITRATE 0.05 MG/ML IJ SOLN
INTRAMUSCULAR | Status: AC
  Filled 2014-05-07: qty 2

## 2014-05-07 MED ORDER — VERAPAMIL HCL 2.5 MG/ML IV SOLN
INTRAVENOUS | Status: AC
  Filled 2014-05-07: qty 2

## 2014-05-07 NOTE — Progress Notes (Signed)
Patient received from cath lab post cardiac stenting approx. 1845.  Per cath staff, he was still quite sedated, with mentation affected by versed and fentanyl given in the case.  Patient is confused and anxious.   After further observation and discussion with family, I requested rapid response to look at the patient and give their opinion.  Dr. Neita Garnet called and made aware.  Cecilie Kicks, NP also called and made aware.  Mickel Baas is here at this time (2021) to see patient as well.  Patient is HTNsive and complaining of HA as well; has been given tylenol and hydralazine IV.  Night shift staff will continue to monitor for any changes in condition.

## 2014-05-07 NOTE — Plan of Care (Signed)
Problem: Phase II Progression Outcomes Goal: Hemodynamically stable Outcome: Completed/Met Date Met:  05/07/14

## 2014-05-07 NOTE — Progress Notes (Signed)
Patient Profile: 73 year old male admitted for exertional chest pain.  Subjective: No symptoms at rest.   Objective: Vital signs in last 24 hours: Temp:  [97.8 F (36.6 C)-98.2 F (36.8 C)] 98 F (36.7 C) (11/10 0400) Pulse Rate:  [50-62] 55 (11/10 0400) Resp:  [14-23] 18 (11/10 0400) BP: (115-148)/(47-76) 127/76 mmHg (11/10 0400) SpO2:  [93 %-98 %] 95 % (11/10 0400) Weight:  [179 lb (81.194 kg)-179 lb 0.2 oz (81.2 kg)] 179 lb 0.2 oz (81.2 kg) (11/10 0400) Last BM Date: 05/06/14  Intake/Output from previous day: 11/09 0701 - 11/10 0700 In: 88.3 [I.V.:88.3] Out: 700 [Urine:700] Intake/Output this shift:    Medications Current Facility-Administered Medications  Medication Dose Route Frequency Provider Last Rate Last Dose  . 0.9 %  sodium chloride infusion  250 mL Intravenous PRN Rogelia Mire, NP      . 0.9 %  sodium chloride infusion  1 mL/kg/hr Intravenous Continuous Rogelia Mire, NP 81.2 mL/hr at 05/07/14 0418 1 mL/kg/hr at 05/07/14 0418  . acetaminophen (TYLENOL) tablet 650 mg  650 mg Oral Q4H PRN Berle Mull, MD      . amLODipine (NORVASC) tablet 5 mg  5 mg Oral Daily Berle Mull, MD   5 mg at 05/07/14 0906  . aspirin tablet 325 mg  325 mg Oral Daily Domenic Polite, MD   325 mg at 05/07/14 0906  . atenolol (TENORMIN) tablet 100 mg  100 mg Oral Daily Berle Mull, MD   100 mg at 05/07/14 0907  . ezetimibe (ZETIA) tablet 10 mg  10 mg Oral Daily Berle Mull, MD   10 mg at 05/06/14 1033  . heparin ADULT infusion 100 units/mL (25000 units/250 mL)  1,000 Units/hr Intravenous Continuous Georgina Peer, [facility] 10 mL/hr at 05/06/14 1710 1,000 Units/hr at 05/06/14 1710  . hydrochlorothiazide (HYDRODIURIL) tablet 25 mg  25 mg Oral Daily Berle Mull, MD   25 mg at 05/06/14 1033  . living well with diabetes book MISC   Does not apply Once Domenic Polite, MD      . morphine 2 MG/ML injection 2 mg  2 mg Intravenous Q4H PRN Berle Mull, MD      . nitroGLYCERIN  (NITROGLYN) 2 % ointment 0.5 inch  0.5 inch Topical 4 times per day Rogelia Mire, NP   0.5 inch at 05/07/14 0612  . nitroGLYCERIN (NITROSTAT) SL tablet 0.4 mg  0.4 mg Sublingual Q5 min PRN Berle Mull, MD      . ondansetron (ZOFRAN) injection 4 mg  4 mg Intravenous Q6H PRN Berle Mull, MD      . pravastatin (PRAVACHOL) tablet 10 mg  10 mg Oral Daily Berle Mull, MD      . sodium chloride 0.9 % injection 3 mL  3 mL Intravenous Q12H Rogelia Mire, NP   3 mL at 05/06/14 2200  . sodium chloride 0.9 % injection 3 mL  3 mL Intravenous PRN Rogelia Mire, NP        PE: General appearance: alert, cooperative and no distress Lungs: clear to auscultation bilaterally Heart: regular rate and rhythm Extremities: no LEE Pulses: 2+ and symmetric Skin: warm and dry Neurologic: Grossly normal  Lab Results:   Recent Labs  05/06/14 0318 05/07/14 0103  WBC 6.0 5.4  HGB 14.5 13.6  HCT 40.8 38.7*  PLT 185 166   BMET  Recent Labs  05/06/14 0318 05/07/14 0103  NA 139 141  K 3.7 3.5*  CL 101 101  CO2 22 24  GLUCOSE 214* 148*  BUN 22 22  CREATININE 1.00 1.08  CALCIUM 9.2 8.9   PT/INR  Recent Labs  05/06/14 0334  LABPROT 12.2  INR 0.90     Assessment/Plan    Principal Problem:   Chest pain Active Problems:   CAD (coronary artery disease)   HTN (hypertension)   HLD (hyperlipidemia)   Pain in the chest  1. Exertional Chest Pain: No resting angina. Troponins negative x 3. Plan for diagnostic LHC +/- PCI today. INR WNL. Renal function normal.   2. HTN: well controlled.   3. HLD: continue statin.  4. DM: Hgb A1C 7.2. Will need close f/u with is PCP.     LOS: 1 day    Brittainy M. Rosita Fire, PA-C 05/07/2014 9:55 AM  I have personally seen and examined this patient with Lyda Jester, PA-C. I agree with the assessment and plan as outlined above. Pt is having no pain this am. Plans for cardiac cath later today with Dr. Martinique.    MCALHANY,CHRISTOPHER 05/07/2014 10:50 AM

## 2014-05-07 NOTE — Interval H&P Note (Signed)
History and Physical Interval Note:  05/07/2014 4:15 PM  Jeffery Schmidt  has presented today for surgery, with the diagnosis of cp  The various methods of treatment have been discussed with the patient and family. After consideration of risks, benefits and other options for treatment, the patient has consented to  Procedure(s): LEFT HEART CATHETERIZATION WITH CORONARY ANGIOGRAM (N/A) as a surgical intervention .  The patient's history has been reviewed, patient examined, no change in status, stable for surgery.  I have reviewed the patient's chart and labs.  Questions were answered to the patient's satisfaction.   Cath Lab Visit (complete for each Cath Lab visit)  Clinical Evaluation Leading to the Procedure:   ACS: Yes.    Non-ACS:    Anginal Classification: CCS IV  Anti-ischemic medical therapy: Minimal Therapy (1 class of medications)  Non-Invasive Test Results: No non-invasive testing performed  Prior CABG: No previous CABG        Collier [facility]ina [facility] 05/07/2014 4:15 PM

## 2014-05-07 NOTE — Progress Notes (Addendum)
Called to see pt post PCI.  Pt confused at times, seems to come and go and he becomes frustrated.  He is hard of hearing as well.  This behavior is not anywhere near his normal per family.  He could tell me his name, but not month or year.  Discussed with Dr. Aundra Dubin and will obtain stat non contrast CT of his head and ask neuro to see.  We gave IV hydralazine as well for elevated BP.

## 2014-05-07 NOTE — Plan of Care (Signed)
Problem: Food- and Nutrition-Related Knowledge Deficit (NB-1.1) Goal: Nutrition education Formal process to instruct or train a patient/client in a skill or to impart knowledge to help patients/clients voluntarily manage or modify food choices and eating behavior to maintain or improve health. Outcome: Completed/Met Date Met:  05/07/14  RD consulted for nutrition education regarding diabetes. Patient and family is interested in OP education, will order.    Lab Results  Component Value Date    HGBA1C 7.2* 05/06/2014    RD provided "Carbohydrate Counting for People with Diabetes" handout from the Academy of [employer] and Dietetics. Discussed different food groups and their effects on blood sugar, emphasizing carbohydrate-containing foods. Provided list of carbohydrates and recommended serving sizes of common foods.  Discussed importance of controlled and consistent carbohydrate intake throughout the day. Provided examples of ways to balance meals/snacks and encouraged intake of high-fiber, whole grain complex carbohydrates. Teach back method used.  Expect good compliance.  Body mass index is 28.91 kg/(m^2). Pt meets criteria for overweight based on current BMI.  Current diet order is NPO for heart catheterization later today, patient is hungry. Labs and medications reviewed. No further nutrition interventions warranted at this time. RD contact information provided. If additional nutrition issues arise, please re-consult RD.  Kimberly Harris, RD, LDN, CNSC Pager [phone] After Hours Pager [phone]         

## 2014-05-07 NOTE — Progress Notes (Addendum)
[facility] PROGRESS NOTE  Jeffery Schmidt HKV:[id number] DOB: 12-21-1940 DOA: 05/06/2014 PCP: Criselda Peaches, MD   Brief Narrative: 73/M with PMH of CAD, HTN, presented to the ER with chest pain, recurrent episodes since Saturday, he has prior h/o PTCI in 1998, had myoview in 7/14 normal, since admission, d-dimer negative, troponin negative. 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 CAD: - h/o PCI in 1998 and normal myoview 7/15  3. HTN: well controlled.   3. HLD: continue statin.  4. DM: new diagnosis  -Hgb A1C 7.2, DM coordinator consult  -po meds at DC, diet education  DVT proph: lovenox  Code Status: Full Code Family Communication: none at bedside Disposition Plan: home pending workup  Consultants:  Cardiology  HPI/Subjective: No further chest pain  Objective: Filed Vitals:   05/07/14 0400  BP: 127/76  Pulse: 55  Temp: 98 F (36.7 C)  Resp: 18    Intake/Output Summary (Last 24 hours) at 05/07/14 1056 Last data filed at 05/07/14 0200  Gross per 24 hour  Intake  88.33 ml  Output    700 ml  Net -611.67 ml   Filed Weights   05/06/14 0315 05/06/14 1547 05/07/14 0400  Weight: 81.194 kg (179 lb) 81.194 kg (179 lb) 81.2 kg (179 lb 0.2 oz)    Exam:   General:  AAOx3  Cardiovascular: S1S2/RRR  Respiratory: CTAB  Abdomen: soft, NT, BS present  Musculoskeletal: no edema    Data Reviewed: Basic Metabolic Panel:  Recent Labs Lab 05/06/14 0318 05/07/14 0103  NA 139 141  K 3.7 3.5*  CL 101 101  CO2 22 24  GLUCOSE 214* 148*  BUN 22 22  CREATININE 1.00 1.08  CALCIUM 9.2 8.9   Liver Function Tests: No results for input(s): AST, ALT, ALKPHOS, BILITOT, PROT, ALBUMIN in the last 168 hours. No results for input(s): LIPASE, AMYLASE in the last 168 hours. No results for input(s): AMMONIA in the  last 168 hours. CBC:  Recent Labs Lab 05/06/14 0318 05/07/14 0103  WBC 6.0 5.4  HGB 14.5 13.6  HCT 40.8 38.7*  MCV 90.5 89.4  PLT 185 166   Cardiac Enzymes:  Recent Labs Lab 05/06/14 0407 05/06/14 1055 05/06/14 1701  TROPONINI <0.30 <0.30 <0.30   BNP (last 3 results) No results for input(s): PROBNP in the last 8760 hours. CBG: No results for input(s): GLUCAP in the last 168 hours.  No results found for this or any previous visit (from the past 240 hour(s)).   Studies: Dg Chest 2 View  05/06/2014   CLINICAL DATA:  Burning of right chest pain.  EXAM: CHEST  2 VIEW  COMPARISON:  PA and lateral chest 10/03/2006.  FINDINGS: Lung volumes are low but the lungs are clear with a punctate calcified granuloma in the right middle lobe unchanged. Heart size is normal. No pneumothorax or pleural effusion.  IMPRESSION: No acute finding in a low volume chest.   Electronically Signed   By: Inge Rise M.D.   On: 05/06/2014 03:54    Scheduled Meds: . amLODipine  5 mg Oral Daily  . aspirin  325 mg Oral Daily  . atenolol  100 mg Oral Daily  . ezetimibe  10 mg Oral Daily  . hydrochlorothiazide  25 mg Oral Daily  . nitroGLYCERIN  0.5 inch Topical 4 times per day  . pravastatin  10  mg Oral q1800  . sodium chloride  3 mL Intravenous Q12H   Continuous Infusions: . sodium chloride 1 mL/kg/hr (05/07/14 0418)  . heparin 1,000 Units/hr (05/06/14 1710)   Antibiotics Given (last 72 hours)    None      Principal Problem:   Chest pain Active Problems:   CAD (coronary artery disease)   HTN (hypertension)   HLD (hyperlipidemia)   Pain in the chest    Time spent: 59min    Yarelin Reichardt  [facility] Pager [phone]. If 7PM-7AM, please contact night-coverage at [email or web address], password [facility] 05/07/2014, 10:56 AM  LOS: 1 day

## 2014-05-07 NOTE — Progress Notes (Signed)
Received pt and he was confused to situation and place. NP came and assessed the patient as well as Dr. Neita Garnet. Confusion progressively worse movement of all extremities normal. I took him down to have CT scan done and unable to do because he was uncooperative and agitated about 30 mins spent trying to get him to take the test. He was brought back to the room and Neuro consult Dr. Armida Sans came and saw him. I did give 1 mg ativan which relaxed him some but he still pushes staff away. During all of this his complaint has been nausea and headache which was worsened by movement. SBP elevated up to 170's PRN med given. Unable to complete CT scan tonight Dr. Armida Sans notified and agrees with waiting until tomorrow let patient try to rest tonight. Family has been at bedside during all of this and will stay the night. I will continue to monitor patient and try let him rest also.

## 2014-05-07 NOTE — Plan of Care (Signed)
Problem: Phase II Progression Outcomes Goal: Hemodynamically stable Outcome: Completed/Met Date Met:  05/07/14 Goal: Anginal pain relieved Outcome: Completed/Met Date Met:  05/07/14 Goal: Stress Test if indicated Outcome: Not Applicable Date Met:  22/33/61 Goal: Cath/PCI Day Path if indicated Outcome: Completed/Met Date Met:  05/07/14 Goal: CV Risk Factors identified Outcome: Completed/Met Date Met:  05/07/14 Goal: Cardiac Rehab if ordered Outcome: Not Applicable Date Met:  22/44/97 Goal: If positive for MI, change to MI Path Outcome: Not Applicable Date Met:  53/00/51

## 2014-05-07 NOTE — Research (Signed)
Bioflow Informed Consent   Subject Name: Jeffery Schmidt  Subject met inclusion and exclusion criteria.  The informed consent form, study requirements and expectations were reviewed with the subject and questions and concerns were addressed prior to the signing of the consent form.  The subject verbalized understanding of the trail requirements.  The subject agreed to participate in the Bioflow trial and signed the informed consent.  The informed consent was obtained prior to performance of any protocol-specific procedures for the subject.  A copy of the signed informed consent was given to the subject and a copy was placed in the subject's medical record.  Sandie Ano 05/07/2014,9:07

## 2014-05-07 NOTE — Progress Notes (Addendum)
Called per floor RN to see patient for AMS post heart cath this evening. Pt received fentanyl and versed in the cath lab.  Upon my arrival to room pt found awake resting in bed. Speech clear, follows commands, alert, oriented to self, time, and minimal situation. Pt states "I had a stent placed" when asked why he came to hospital. He is able to recognize his family well. Complains of head ache only at this time and is receiving tylenol po. Pt is hyper tensive at this time. RN to notify NP and request hydralazine IV. Dr. Martinique notified prior to my arrival of patients confusion, no orders at this time. NP en route to reassess pt tonight. Floor RN to monitor pt closely and alert myself and provider of worsening changes.

## 2014-05-07 NOTE — CV Procedure (Signed)
    Cardiac Catheterization Procedure Note  Name: Jeffery Schmidt MRN: [id number] DOB: Sep 30, 1940  Procedure: Left Heart Cath, Selective Coronary Angiography,  PTCA and stenting of the proximal LAD and mid RCA.  Indication: 73 yo WM with history of remote PCI presents with unstable angina.   Procedural Details:  The right wrist was prepped, draped, and anesthetized with 1% lidocaine. Using the modified Seldinger technique, a 6 French slender sheath was introduced into the right radial artery. 3 mg of verapamil was administered through the sheath, weight-based unfractionated heparin was administered intravenously. Standard Judkins catheters were used for selective coronary angiography and left ventriculography. A Noto catheter was used to engage the RCA. Catheter exchanges were performed over an exchange length guidewire.  PROCEDURAL FINDINGS Hemodynamics: AO 168/72 mean 109 mm Hg LV 170/8 mm Hg   Coronary angiography: Coronary dominance: right  Left mainstem: Normal  Left anterior descending (LAD): The LAD is a large vessel with complex 90% stenosis in the proximal vessel. There is diffuse disease in the mid vessel up to 20-30%.   There is a large ramus intermediate branch that is normal.  Left circumflex (LCx): The LCx is a large vessel with 30% mid vessel disease. There appears to be a stent in the distal vessel that is widely patent.   Right coronary artery (RCA): The RCA has a 90% stenosis in the mid vessel.   Left ventriculography: Not done.  PCI Note:  Following the diagnostic procedure, the decision was made to proceed with PCI of the LAD.  Weight-based bivalirudin was given for anticoagulation. Effient 60 mg was given orally.  Once a therapeutic ACT was achieved, a 6 [place] XBLAD 3.5 guide catheter was inserted.  A choice PT moderate support coronary guidewire was used to cross the lesion.  The lesion was predilated with a 2.5 mm balloon.  The lesion was then stented with a  3.0 x 18 mm Biotonik Orsiro stent.  The stent was postdilated with a 3.5 mm noncompliant balloon.  Following PCI, there was 0% residual stenosis and TIMI-3 flow. Final angiography confirmed an excellent result.   We next addressed the RCA lesion. The lesion was crossed with a Choice PT moderate support wire. The lesion was pre dilated with a 2.25 mm balloon. The lesion was then stented with a 2.5 x 18 mm Biotronik Orsiro stent. The lesion was post dilated with a 2.75 mm noncompliant balloon. Following PCI, there was 0% residual stenosis and TIMI-3 flow. Final angiography confirmed an excellent result.  The patient tolerated the procedure well. There were no immediate procedural complications. A TR band was used for radial hemostasis. The patient was transferred to the post catheterization recovery area for further monitoring.  PCI Data: Vessel - LAD/Segment - proximal Percent Stenosis (pre)  90% TIMI-flow 3 Stent 3.0 x 18 mm Biotronik Osiro DES Percent Stenosis (post) 0% TIMI-flow (post) 3  Vessel #2: RCA/mid Percent stenosis (pre) 90% TIMI flow (pre) 3 Stent: 2.5 x 18 mm Biotronik Osiro DES Percent stenosis (post) 0% TIMI flow (post) 3  Final Conclusions:   1. Severe 2 vessel obstructive CAD 2. Patent stent in the distal LCx. 3. Successful stenting of the proximal LAD with DES 4. Successful stenting of the mid RCA with DES.   Recommendations:  DAPT for one year. Risk factor modification. Anticipate DC in am.   Peter Martinique, [place] 05/07/2014, 6:06 PM

## 2014-05-07 NOTE — Consult Note (Signed)
Referring Physician: Dr. Aundra Dubin    Chief Complaint: confusion after cardiac cath, concern for stroke  HPI:                                                                                                                                         Jeffery Schmidt is an 73 y.o. male with a past medical history significant for HTN, hyperlipidemia, CAD s/p PCI 1998, and kidney stones. He underwent uneventful cardiac cath and stenting earlier today and post PCI patient noted to be intermittently confused and he becomes frustrated. As per family, this is a complete change in patient behavior. " Per cath staff, he was still quite sedated, with mentation affected by versed and fentanyl given in the case. Patient is confused and anxious".  Required IV hydralazine for elevated BP. Nursing staff reports a non focal exam and disorientation to place before going for CT brain but while in the CT suite exhibited confusion and agitated behavior and CT brain couldn't be completed. In addition, has been complaining of HA. Serologies are unimpressive.  Date last known well: 05/07/14 Time last known well: unable to determine.  tPA Given: no, patient with intermittent confusion but non focal neuro-exam.   Past Medical History  Diagnosis Date  . HTN (hypertension)   . HLD (hyperlipidemia)   . Kidney stones   . CAD (coronary artery disease)     a. 1998 s/p PCI;  b. 12/2012 MV: EF 68%, no ischemia.    Past Surgical History  Procedure Laterality Date  . Lithotripsy  1990's  . Nephrostomy  1990's  . Coronary angioplasty  1998  . Blepharoplasty Bilateral 2000's    Family History  Problem Relation Age of Onset  . Coronary artery disease    . Heart disease    . Hypertension    . Stroke    . Hypertension Mother    Social History:  reports that he has quit smoking. He has never used smokeless tobacco. He reports that he does not drink alcohol or use illicit drugs.  Allergies:  Allergies  Allergen Reactions   . Epinephrine     unknown    Medications:                                                                                                                           I have reviewed  the patient's current medications.  ROS:                                                                                                                                       History obtained from the patient, family, and chart review  General ROS: negative for - chills, fatigue, fever, night sweats, weight gain or weight loss Psychological ROS: negative for - behavioral disorder, hallucinations, memory difficulties, mood swings or suicidal ideation Ophthalmic ROS: negative for - blurry vision, double vision, eye pain or loss of vision ENT ROS: negative for - epistaxis, nasal discharge, oral lesions, sore throat, tinnitus or vertigo Allergy and Immunology ROS: negative for - hives or itchy/watery eyes Hematological and Lymphatic ROS: negative for - bleeding problems, bruising or swollen lymph nodes Endocrine ROS: negative for - galactorrhea, hair pattern changes, polydipsia/polyuria or temperature intolerance Respiratory ROS: negative for - cough, hemoptysis, shortness of breath or wheezing Cardiovascular ROS: negative for - chest pain, dyspnea on exertion, edema or irregular heartbeat Gastrointestinal ROS: negative for - abdominal pain, diarrhea, hematemesis, nausea/vomiting or stool incontinence Genito-Urinary ROS: negative for - dysuria, hematuria, incontinence or urinary frequency/urgency Musculoskeletal ROS: negative for - joint swelling or muscular weakness Neurological ROS: as noted in HPI Dermatological ROS: negative for rash and skin lesion changes    Physical exam: pleasant male in no apparent distress. Head: normocephalic. Neck: supple, no bruits, no JVD. Cardiac: no murmurs. Lungs: clear. Abdomen: soft, no tender, no mass. Extremities: no edema.    Neurologic Examination:                                                                                                       Blood pressure 185/98, pulse 51, temperature 97.7 F (36.5 C), temperature source Oral, resp. rate 18, height $RemoveBe'5\' 6"'UmnPSBvum$  (1.676 m), weight 81.2 kg (179 lb 0.2 oz), SpO2 100 %.     Results for orders placed or performed during the hospital encounter of 05/06/14 (from the past 48 hour(s))  CBC     Status: None   Collection Time: 05/06/14  3:18 AM  Result Value Ref Range   WBC 6.0 4.0 - 10.5 K/uL   RBC 4.51 4.22 - 5.81 MIL/uL   Hemoglobin 14.5 13.0 - 17.0 g/dL   HCT 40.8 39.0 - 52.0 %   MCV 90.5 78.0 - 100.0 fL   MCH 32.2 26.0 - 34.0 pg   MCHC 35.5 30.0 - 36.0 g/dL   RDW 12.7 11.5 - 15.5 %  Platelets 185 150 - 400 K/uL  Basic metabolic panel     Status: Abnormal   Collection Time: 05/06/14  3:18 AM  Result Value Ref Range   Sodium 139 137 - 147 mEq/L   Potassium 3.7 3.7 - 5.3 mEq/L   Chloride 101 96 - 112 mEq/L   CO2 22 19 - 32 mEq/L   Glucose, Bld 214 (H) 70 - 99 mg/dL   BUN 22 6 - 23 mg/dL   Creatinine, Ser 1.00 0.50 - 1.35 mg/dL   Calcium 9.2 8.4 - 10.5 mg/dL   GFR calc non Af Amer 72 (L) >90 mL/min   GFR calc Af Amer 83 (L) >90 mL/min    Comment: (NOTE) The eGFR has been calculated using the CKD EPI equation. This calculation has not been validated in all clinical situations. eGFR's persistently <90 mL/min signify possible Chronic Kidney Disease.    Anion gap 16 (H) 5 - 15  Protime-INR     Status: None   Collection Time: 05/06/14  3:34 AM  Result Value Ref Range   Prothrombin Time 12.2 11.6 - 15.2 seconds   INR 0.90 0.00 - 1.49  D-dimer, quantitative     Status: None   Collection Time: 05/06/14  3:34 AM  Result Value Ref Range   D-Dimer, Quant 0.33 0.00 - 0.48 ug/mL-FEU    Comment:        AT THE INHOUSE ESTABLISHED CUTOFF VALUE OF 0.48 ug/mL FEU, THIS ASSAY HAS BEEN DOCUMENTED IN THE LITERATURE TO HAVE A SENSITIVITY AND NEGATIVE PREDICTIVE VALUE OF AT LEAST 98 TO 99%.  THE TEST  RESULT SHOULD BE CORRELATED WITH AN ASSESSMENT OF THE CLINICAL PROBABILITY OF DVT / VTE.   Troponin I     Status: None   Collection Time: 05/06/14  4:07 AM  Result Value Ref Range   Troponin I <0.30 <0.30 ng/mL    Comment:        Due to the release kinetics of cTnI, a negative result within the first hours of the onset of symptoms does not rule out myocardial infarction with certainty. If myocardial infarction is still suspected, repeat the test at appropriate intervals.   Troponin I-serum (one time only)     Status: None   Collection Time: 05/06/14 10:55 AM  Result Value Ref Range   Troponin I <0.30 <0.30 ng/mL    Comment:        Due to the release kinetics of cTnI, a negative result within the first hours of the onset of symptoms does not rule out myocardial infarction with certainty. If myocardial infarction is still suspected, repeat the test at appropriate intervals.   Hemoglobin A1c     Status: Abnormal   Collection Time: 05/06/14 10:55 AM  Result Value Ref Range   Hgb A1c MFr Bld 7.2 (H) <5.7 %    Comment: (NOTE)                                                                       According to the ADA Clinical Practice Recommendations for 2011, when HbA1c is used as a screening test:  >=6.5%   Diagnostic of Diabetes Mellitus           (if abnormal result is confirmed)  5.7-6.4%   Increased risk of developing Diabetes Mellitus References:Diagnosis and Classification of Diabetes Mellitus,Diabetes YOVZ,8588,50(YDXAJ 1):S62-S69 and Standards of Medical Care in         Diabetes - 2011,Diabetes OINO,6767,20 (Suppl 1):S11-S61.    Mean Plasma Glucose 160 (H) <117 mg/dL    Comment: Performed at [facility]  Troponin I-serum (one time only)     Status: None   Collection Time: 05/06/14  5:01 PM  Result Value Ref Range   Troponin I <0.30 <0.30 ng/mL    Comment:        Due to the release kinetics of cTnI, a negative result within the first hours of the onset of  symptoms does not rule out myocardial infarction with certainty. If myocardial infarction is still suspected, repeat the test at appropriate intervals.   Heparin level (unfractionated)     Status: None   Collection Time: 05/07/14  1:03 AM  Result Value Ref Range   Heparin Unfractionated 0.44 0.30 - 0.70 IU/mL    Comment:        IF HEPARIN RESULTS ARE BELOW EXPECTED VALUES, AND PATIENT DOSAGE HAS BEEN CONFIRMED, SUGGEST FOLLOW UP TESTING OF ANTITHROMBIN III LEVELS.   CBC     Status: Abnormal   Collection Time: 05/07/14  1:03 AM  Result Value Ref Range   WBC 5.4 4.0 - 10.5 K/uL   RBC 4.33 4.22 - 5.81 MIL/uL   Hemoglobin 13.6 13.0 - 17.0 g/dL   HCT 38.7 (L) 39.0 - 52.0 %   MCV 89.4 78.0 - 100.0 fL   MCH 31.4 26.0 - 34.0 pg   MCHC 35.1 30.0 - 36.0 g/dL   RDW 12.7 11.5 - 15.5 %   Platelets 166 150 - 400 K/uL  Basic metabolic panel     Status: Abnormal   Collection Time: 05/07/14  1:03 AM  Result Value Ref Range   Sodium 141 137 - 147 mEq/L   Potassium 3.5 (L) 3.7 - 5.3 mEq/L   Chloride 101 96 - 112 mEq/L   CO2 24 19 - 32 mEq/L   Glucose, Bld 148 (H) 70 - 99 mg/dL   BUN 22 6 - 23 mg/dL   Creatinine, Ser 1.08 0.50 - 1.35 mg/dL   Calcium 8.9 8.4 - 10.5 mg/dL   GFR calc non Af Amer 66 (L) >90 mL/min   GFR calc Af Amer 76 (L) >90 mL/min    Comment: (NOTE) The eGFR has been calculated using the CKD EPI equation. This calculation has not been validated in all clinical situations. eGFR's persistently <90 mL/min signify possible Chronic Kidney Disease.    Anion gap 16 (H) 5 - 15  CK total and CKMB (cardiac)     Status: None   Collection Time: 05/07/14  9:58 AM  Result Value Ref Range   Total CK 52 7 - 232 U/L   CK, MB 1.2 0.3 - 4.0 ng/mL   Relative Index RELATIVE INDEX IS INVALID 0.0 - 2.5    Comment: WHEN CK < 100 U/L          Heparin level (unfractionated)     Status: None   Collection Time: 05/07/14  9:59 AM  Result Value Ref Range   Heparin Unfractionated 0.37 0.30 -  0.70 IU/mL    Comment:        IF HEPARIN RESULTS ARE BELOW EXPECTED VALUES, AND PATIENT DOSAGE HAS BEEN CONFIRMED, SUGGEST FOLLOW UP TESTING OF ANTITHROMBIN III LEVELS.   POCT Activated clotting time  Status: None   Collection Time: 05/07/14  5:00 PM  Result Value Ref Range   Activated Clotting Time 551 seconds   Dg Chest 2 View  05/06/2014   CLINICAL DATA:  Burning of right chest pain.  EXAM: CHEST  2 VIEW  COMPARISON:  PA and lateral chest 10/03/2006.  FINDINGS: Lung volumes are low but the lungs are clear with a punctate calcified granuloma in the right middle lobe unchanged. Heart size is normal. No pneumothorax or pleural effusion.  IMPRESSION: No acute finding in a low volume chest.   Electronically Signed   By: Inge Rise M.D.   On: 05/06/2014 03:54    Assessment: 73 y.o. male with acute onset intermittent confusion and agitation after uneventful cardiac cath earlier today. Neuro-exam is very limited due to patient's lack of cooperation and agitation but previosly described as non focal by rapid response nurse. I wonder if this acute intermittent confusional/agitated state is medication related. Recommend administering 1 mg IV ativan in order to obtain CT brain searching for a structural cause for this acute change in mental status.   Dorian Pod, MD [facility] [phone]  05/07/2014, 9:31 PM

## 2014-05-07 NOTE — H&P (View-Only) (Signed)
Patient Profile: 73 year old male admitted for exertional chest pain.  Subjective: No symptoms at rest.   Objective: Vital signs in last 24 hours: Temp:  [97.8 F (36.6 C)-98.2 F (36.8 C)] 98 F (36.7 C) (11/10 0400) Pulse Rate:  [50-62] 55 (11/10 0400) Resp:  [14-23] 18 (11/10 0400) BP: (115-148)/(47-76) 127/76 mmHg (11/10 0400) SpO2:  [93 %-98 %] 95 % (11/10 0400) Weight:  [179 lb (81.194 kg)-179 lb 0.2 oz (81.2 kg)] 179 lb 0.2 oz (81.2 kg) (11/10 0400) Last BM Date: 05/06/14  Intake/Output from previous day: 11/09 0701 - 11/10 0700 In: 88.3 [I.V.:88.3] Out: 700 [Urine:700] Intake/Output this shift:    Medications Current Facility-Administered Medications  Medication Dose Route Frequency Provider Last Rate Last Dose  . 0.9 %  sodium chloride infusion  250 mL Intravenous PRN Rogelia Mire, NP      . 0.9 %  sodium chloride infusion  1 mL/kg/hr Intravenous Continuous Rogelia Mire, NP 81.2 mL/hr at 05/07/14 0418 1 mL/kg/hr at 05/07/14 0418  . acetaminophen (TYLENOL) tablet 650 mg  650 mg Oral Q4H PRN Berle Mull, MD      . amLODipine (NORVASC) tablet 5 mg  5 mg Oral Daily Berle Mull, MD   5 mg at 05/07/14 0906  . aspirin tablet 325 mg  325 mg Oral Daily Domenic Polite, MD   325 mg at 05/07/14 0906  . atenolol (TENORMIN) tablet 100 mg  100 mg Oral Daily Berle Mull, MD   100 mg at 05/07/14 0907  . ezetimibe (ZETIA) tablet 10 mg  10 mg Oral Daily Berle Mull, MD   10 mg at 05/06/14 1033  . heparin ADULT infusion 100 units/mL (25000 units/250 mL)  1,000 Units/hr Intravenous Continuous Georgina Peer, [facility] 10 mL/hr at 05/06/14 1710 1,000 Units/hr at 05/06/14 1710  . hydrochlorothiazide (HYDRODIURIL) tablet 25 mg  25 mg Oral Daily Berle Mull, MD   25 mg at 05/06/14 1033  . living well with diabetes book MISC   Does not apply Once Domenic Polite, MD      . morphine 2 MG/ML injection 2 mg  2 mg Intravenous Q4H PRN Berle Mull, MD      . nitroGLYCERIN  (NITROGLYN) 2 % ointment 0.5 inch  0.5 inch Topical 4 times per day Rogelia Mire, NP   0.5 inch at 05/07/14 0612  . nitroGLYCERIN (NITROSTAT) SL tablet 0.4 mg  0.4 mg Sublingual Q5 min PRN Berle Mull, MD      . ondansetron (ZOFRAN) injection 4 mg  4 mg Intravenous Q6H PRN Berle Mull, MD      . pravastatin (PRAVACHOL) tablet 10 mg  10 mg Oral Daily Berle Mull, MD      . sodium chloride 0.9 % injection 3 mL  3 mL Intravenous Q12H Rogelia Mire, NP   3 mL at 05/06/14 2200  . sodium chloride 0.9 % injection 3 mL  3 mL Intravenous PRN Rogelia Mire, NP        PE: General appearance: alert, cooperative and no distress Lungs: clear to auscultation bilaterally Heart: regular rate and rhythm Extremities: no LEE Pulses: 2+ and symmetric Skin: warm and dry Neurologic: Grossly normal  Lab Results:   Recent Labs  05/06/14 0318 05/07/14 0103  WBC 6.0 5.4  HGB 14.5 13.6  HCT 40.8 38.7*  PLT 185 166   BMET  Recent Labs  05/06/14 0318 05/07/14 0103  NA 139 141  K 3.7 3.5*  CL 101 101  CO2 22 24  GLUCOSE 214* 148*  BUN 22 22  CREATININE 1.00 1.08  CALCIUM 9.2 8.9   PT/INR  Recent Labs  05/06/14 0334  LABPROT 12.2  INR 0.90     Assessment/Plan    Principal Problem:   Chest pain Active Problems:   CAD (coronary artery disease)   HTN (hypertension)   HLD (hyperlipidemia)   Pain in the chest  1. Exertional Chest Pain: No resting angina. Troponins negative x 3. Plan for diagnostic LHC +/- PCI today. INR WNL. Renal function normal.   2. HTN: well controlled.   3. HLD: continue statin.  4. DM: Hgb A1C 7.2. Will need close f/u with is PCP.     LOS: 1 day    Brittainy M. Rosita Fire, PA-C 05/07/2014 9:55 AM  I have personally seen and examined this patient with Lyda Jester, PA-C. I agree with the assessment and plan as outlined above. Pt is having no pain this am. Plans for cardiac cath later today with Dr. Martinique.    MCALHANY,CHRISTOPHER 05/07/2014 10:50 AM

## 2014-05-07 NOTE — Progress Notes (Signed)
ANTICOAGULATION CONSULT NOTE - Follow Up Consult  Pharmacy Consult for Heparin Indication: chest pain/ACS  Allergies  Allergen Reactions  . Epinephrine     unknown    Patient Measurements: Height: 5\' 6"  (167.6 cm) Weight: 179 lb 0.2 oz (81.2 kg) IBW/kg (Calculated) : 63.8 Heparin Dosing Weight: 74 kg  Vital Signs: Temp: 98 F (36.7 C) (11/10 0400) Temp Source: Oral (11/10 0400) BP: 127/76 mmHg (11/10 0400) Pulse Rate: 55 (11/10 0400)  Labs:  Recent Labs  05/06/14 0318 05/06/14 0334 05/06/14 0407 05/06/14 1055 05/06/14 1701 05/07/14 0103 05/07/14 0958 05/07/14 0959  HGB 14.5  --   --   --   --  13.6  --   --   HCT 40.8  --   --   --   --  38.7*  --   --   PLT 185  --   --   --   --  166  --   --   LABPROT  --  12.2  --   --   --   --   --   --   INR  --  0.90  --   --   --   --   --   --   HEPARINUNFRC  --   --   --   --   --  0.44  --  0.37  CREATININE 1.00  --   --   --   --  1.08  --   --   CKTOTAL  --   --   --   --   --   --  52  --   CKMB  --   --   --   --   --   --  1.2  --   TROPONINI  --   --  <0.30 <0.30 <0.30  --   --   --     Estimated Creatinine Clearance: 60.1 mL/min (by C-G formula based on Cr of 1.08).   Medications:  Scheduled:  . amLODipine  5 mg Oral Daily  . aspirin  325 mg Oral Daily  . atenolol  100 mg Oral Daily  . ezetimibe  10 mg Oral Daily  . hydrochlorothiazide  25 mg Oral Daily  . nitroGLYCERIN  0.5 inch Topical 4 times per day  . pravastatin  10 mg Oral q1800  . sodium chloride  3 mL Intravenous Q12H   Infusions:  . sodium chloride 1 mL/kg/hr (05/07/14 0418)  . heparin 1,000 Units/hr (05/06/14 1710)    Assessment: 73 yo M admitted for CP and started on IV heparin.  Heparin level is therapeutic on 1000 units/hr.  Noted plans for cardiac cath this afternoon.  Goal of Therapy:  Heparin level 0.3-0.7 units/ml Monitor platelets by anticoagulation protocol: Yes   Plan:  Continue heparin at 1000 units/hr Follow-up after  cath  [facility], Pharm.D., BCPS Clinical Pharmacist Pager [phone] 05/07/2014 11:33 AM

## 2014-05-07 NOTE — Progress Notes (Signed)
UR completed 

## 2014-05-07 NOTE — Progress Notes (Signed)
ANTICOAGULATION CONSULT NOTE - Follow Up Consult  Pharmacy Consult for heparin Indication: USAP  Labs:  Recent Labs  05/06/14 0318 05/06/14 0334 05/06/14 0407 05/06/14 1055 05/06/14 1701 05/07/14 0103  HGB 14.5  --   --   --   --  13.6  HCT 40.8  --   --   --   --  38.7*  PLT 185  --   --   --   --  166  LABPROT  --  12.2  --   --   --   --   INR  --  0.90  --   --   --   --   HEPARINUNFRC  --   --   --   --   --  0.44  CREATININE 1.00  --   --   --   --   --   TROPONINI  --   --  <0.30 <0.30 <0.30  --      Assessment/Plan:  73yo male therapeutic on heparin with initial dosing for CAD/USAP. Will continue gtt at current rate and confirm stable with additional level.   Wynona Neat, PharmD, BCPS  05/07/2014,1:36 AM

## 2014-05-07 NOTE — Progress Notes (Addendum)
Inpatient Diabetes Program Recommendations  AACE/ADA: New Consensus Statement on Inpatient Glycemic Control (2013)  Target Ranges:  Prepandial:   less than 140 mg/dL      Peak postprandial:   less than 180 mg/dL (1-2 hours)      Critically ill patients:  140 - 180 mg/dL     Results for HUMZA, Jeffery Schmidt (MRN [id number]) as of 05/07/2014 08:01  Ref. Range 05/06/2014 10:55  Hgb A1c MFr Bld Latest Range: <5.7 % 7.2 (H)     Admitted with CP.  History of CAD, HTN.    New diagnosis of DM made this admission per MD.  Have ordered educational materials for this patient along with RD consult for DM diet education.  DM Coordinator to see pt today.   MD- If patient's creatinine WNL, please consider discharging patient home on Metformin (48 hours after contrast given for cath).  Metformin 500 mg bid to start.    Addendum 1055am: Spoke with pt about new diagnosis.  Discussed A1C results with him and explained what an A1C is, basic pathophysiology of DM Type 2, basic home care, importance of checking CBGs and maintaining good CBG control to prevent long-term and short-term complications.  Reviewed signs and symptoms of hyperglycemia and hypoglycemia and hypoglycemia treatment.  RNs to provide ongoing basic DM education at bedside with this patient.  Have ordered educational booklet, RD consult, and DM videos.  Have asked RNs working with patient to please allow pt to practice checking fingerstick glucose levels with hospital lancets.  Encouraged patient to check his CBGs 1-2 times per day (check either before a meal or two hours after a meal).  Also reviewed blood sugar goals with patient as well.   Will follow Wyn Quaker RN, MSN, CDE Diabetes Coordinator Inpatient Diabetes Program Team Pager: [phone] (8a-10p)

## 2014-05-08 ENCOUNTER — Inpatient Hospital Stay (HOSPITAL_COMMUNITY): Payer: Medicare Other

## 2014-05-08 DIAGNOSIS — R410 Disorientation, unspecified: Secondary | ICD-10-CM

## 2014-05-08 DIAGNOSIS — R4182 Altered mental status, unspecified: Secondary | ICD-10-CM | POA: Insufficient documentation

## 2014-05-08 LAB — CBC
HEMATOCRIT: 39.6 % (ref 39.0–52.0)
HEMOGLOBIN: 13.7 g/dL (ref 13.0–17.0)
MCH: 31.9 pg (ref 26.0–34.0)
MCHC: 34.6 g/dL (ref 30.0–36.0)
MCV: 92.1 fL (ref 78.0–100.0)
Platelets: 212 10*3/uL (ref 150–400)
RBC: 4.3 MIL/uL (ref 4.22–5.81)
RDW: 13 % (ref 11.5–15.5)
WBC: 8.7 10*3/uL (ref 4.0–10.5)

## 2014-05-08 LAB — GLUCOSE, CAPILLARY
GLUCOSE-CAPILLARY: 130 mg/dL — AB (ref 70–99)
GLUCOSE-CAPILLARY: 141 mg/dL — AB (ref 70–99)
GLUCOSE-CAPILLARY: 132 mg/dL — AB (ref 70–99)
Glucose-Capillary: 172 mg/dL — ABNORMAL HIGH (ref 70–99)

## 2014-05-08 LAB — BASIC METABOLIC PANEL
Anion gap: 18 — ABNORMAL HIGH (ref 5–15)
BUN: 20 mg/dL (ref 6–23)
CHLORIDE: 98 meq/L (ref 96–112)
CO2: 21 mEq/L (ref 19–32)
Calcium: 8.7 mg/dL (ref 8.4–10.5)
Creatinine, Ser: 1.26 mg/dL (ref 0.50–1.35)
GFR calc Af Amer: 63 mL/min — ABNORMAL LOW (ref >90)
GFR, EST NON AFRICAN AMERICAN: 54 mL/min — AB (ref >90)
GLUCOSE: 206 mg/dL — AB (ref 70–99)
POTASSIUM: 3.4 meq/L — AB (ref 3.7–5.3)
Sodium: 137 mEq/L (ref 137–147)

## 2014-05-08 LAB — CK TOTAL AND CKMB (NOT AT ARMC)
CK TOTAL: 86 U/L (ref 7–232)
CK, MB: 1.6 ng/mL (ref 0.3–4.0)
RELATIVE INDEX: INVALID (ref 0.0–2.5)

## 2014-05-08 MED ORDER — SODIUM CHLORIDE 0.9 % IV SOLN
INTRAVENOUS | Status: DC
  Administered 2014-05-08: 13:00:00 via INTRAVENOUS

## 2014-05-08 MED ORDER — INSULIN ASPART 100 UNIT/ML ~~LOC~~ SOLN
0.0000 [IU] | Freq: Three times a day (TID) | SUBCUTANEOUS | Status: DC
  Administered 2014-05-08: 1 [IU] via SUBCUTANEOUS
  Administered 2014-05-09: 07:00:00 2 [IU] via SUBCUTANEOUS

## 2014-05-08 MED ORDER — LIVING WELL WITH DIABETES BOOK
Freq: Once | Status: DC
  Filled 2014-05-08: qty 1

## 2014-05-08 MED ORDER — TICAGRELOR 90 MG PO TABS
90.0000 mg | ORAL_TABLET | Freq: Two times a day (BID) | ORAL | Status: DC
  Administered 2014-05-08 – 2014-05-09 (×3): 90 mg via ORAL
  Filled 2014-05-08 (×4): qty 1

## 2014-05-08 MED ORDER — POTASSIUM CHLORIDE CRYS ER 20 MEQ PO TBCR
40.0000 meq | EXTENDED_RELEASE_TABLET | Freq: Once | ORAL | Status: AC
  Administered 2014-05-08: 13:00:00 40 meq via ORAL
  Filled 2014-05-08: qty 2

## 2014-05-08 MED FILL — Sodium Chloride IV Soln 0.9%: INTRAVENOUS | Qty: 50 | Status: AC

## 2014-05-08 NOTE — Progress Notes (Signed)
Patient Name: Jeffery Schmidt Date of Encounter: 05/08/2014     Principal Problem:   Chest pain Active Problems:   CAD (coronary artery disease)   HTN (hypertension)   HLD (hyperlipidemia)   Pain in the chest   Unstable angina   Type 2 diabetes mellitus with other specified complication    SUBJECTIVE  Acutely confused and combative last night. Going down for head CT this AM.  CURRENT MEDS . amLODipine  5 mg Oral Daily  . aspirin  81 mg Oral Daily  . atenolol  100 mg Oral Daily  . atorvastatin  80 mg Oral q1800  . ezetimibe  10 mg Oral Daily  . hydrochlorothiazide  25 mg Oral Daily  . prasugrel  10 mg Oral Daily    OBJECTIVE  Filed Vitals:   05/07/14 2000 05/07/14 2200 05/07/14 2300 05/08/14 0000  BP: 185/98 172/69 164/89 182/81  Pulse: 51   83  Temp:      TempSrc:      Resp:    16  Height:      Weight:    179 lb 0.2 oz (81.2 kg)  SpO2: 100%       Intake/Output Summary (Last 24 hours) at 05/08/14 0700 Last data filed at 05/08/14 0300  Gross per 24 hour  Intake  649.6 ml  Output      0 ml  Net  649.6 ml   Filed Weights   05/06/14 1547 05/07/14 0400 05/08/14 0000  Weight: 179 lb (81.194 kg) 179 lb 0.2 oz (81.2 kg) 179 lb 0.2 oz (81.2 kg)    PHYSICAL EXAM  General: Pleasant, NAD. Neuro: Alert and oriented X 3. Moves all extremities spontaneously. Psych: Normal affect. HEENT:  Normal  Neck: Supple without bruits or JVD. Lungs:  Resp regular and unlabored, CTA. Heart: RRR no s3, s4, or murmurs. Abdomen: Soft, non-tender, non-distended, BS + x 4.  Extremities: No clubbing, cyanosis or edema. DP/PT/Radials 2+ and equal bilaterally.  Accessory Clinical Findings  CBC  Recent Labs  05/06/14 0318 05/07/14 0103  WBC 6.0 5.4  HGB 14.5 13.6  HCT 40.8 38.7*  MCV 90.5 89.4  PLT 185 270   Basic Metabolic Panel  Recent Labs  05/06/14 0318 05/07/14 0103  NA 139 141  K 3.7 3.5*  CL 101 101  CO2 22 24  GLUCOSE 214* 148*  BUN 22 22    CREATININE 1.00 1.08  CALCIUM 9.2 8.9   Cardiac Enzymes  Recent Labs  05/06/14 0407 05/06/14 1055 05/06/14 1701 05/07/14 0958  CKTOTAL  --   --   --  52  CKMB  --   --   --  1.2  TROPONINI <0.30 <0.30 <0.30  --    D-Dimer  Recent Labs  05/06/14 0334  DDIMER 0.33   Hemoglobin A1C  Recent Labs  05/06/14 1055  HGBA1C 7.2*    TELE  NSR  Radiology/Studies  Dg Chest 2 View  05/06/2014   CLINICAL DATA:  Burning of right chest pain.  EXAM: CHEST  2 VIEW  COMPARISON:  PA and lateral chest 10/03/2006.  FINDINGS: Lung volumes are low but the lungs are clear with a punctate calcified granuloma in the right middle lobe unchanged. Heart size is normal. No pneumothorax or pleural effusion.  IMPRESSION: No acute finding in a low volume chest.   Electronically Signed   By: Inge Rise M.D.   On: 05/06/2014 03:54        Cardiac Catheterization Procedure Note  Name: Jeffery Schmidt MRN: [id number] DOB: 1940/12/10 Procedure: Left Heart Cath, Selective Coronary Angiography, PTCA and stenting of the proximal LAD and mid RCA. Indication: 73 yo WM with history of remote PCI presents with unstable angina.  Procedural Details: The right wrist was prepped, draped, and anesthetized with 1% lidocaine. Using the modified Seldinger technique, a 6 French slender sheath was introduced into the right radial artery. 3 mg of verapamil was administered through the sheath, weight-based unfractionated heparin was administered intravenously. Standard Judkins catheters were used for selective coronary angiography and left ventriculography. A Noto catheter was used to engage the RCA. Catheter exchanges were performed over an exchange length guidewire. PROCEDURAL FINDINGS Hemodynamics: AO 168/72 mean 109 mm Hg LV 170/8 mm Hg Coronary angiography: Coronary dominance: right Left mainstem: Normal Left anterior descending (LAD): The LAD is a large vessel with complex 90% stenosis in the  proximal vessel. There is diffuse disease in the mid vessel up to 20-30%.  There is a large ramus intermediate branch that is normal. Left circumflex (LCx): The LCx is a large vessel with 30% mid vessel disease. There appears to be a stent in the distal vessel that is widely patent.  Right coronary artery (RCA): The RCA has a 90% stenosis in the mid vessel.  Left ventriculography: Not done. PCI Note: Following the diagnostic procedure, the decision was made to proceed with PCI of the LAD. Weight-based bivalirudin was given for anticoagulation. Effient 60 mg was given orally. Once a therapeutic ACT was achieved, a 6 [place] XBLAD 3.5 guide catheter was inserted. A choice PT moderate support coronary guidewire was used to cross the lesion. The lesion was predilated with a 2.5 mm balloon. The lesion was then stented with a 3.0 x 18 mm Biotonik Orsiro stent. The stent was postdilated with a 3.5 mm noncompliant balloon. Following PCI, there was 0% residual stenosis and TIMI-3 flow. Final angiography confirmed an excellent result.  We next addressed the RCA lesion. The lesion was crossed with a Choice PT moderate support wire. The lesion was pre dilated with a 2.25 mm balloon. The lesion was then stented with a 2.5 x 18 mm Biotronik Orsiro stent. The lesion was post dilated with a 2.75 mm noncompliant balloon. Following PCI, there was 0% residual stenosis and TIMI-3 flow. Final angiography confirmed an excellent result. The patient tolerated the procedure well. There were no immediate procedural complications. A TR band was used for radial hemostasis. The patient was transferred to the post catheterization recovery area for further monitoring. PCI Data: Vessel - LAD/Segment - proximal Percent Stenosis (pre) 90% TIMI-flow 3 Stent 3.0 x 18 mm Biotronik Osiro DES Percent Stenosis (post) 0% TIMI-flow (post) 3 Vessel #2: RCA/mid Percent stenosis (pre) 90% TIMI flow (pre) 3 Stent: 2.5 x 18 mm  Biotronik Osiro DES Percent stenosis (post) 0% TIMI flow (post) 3 Final Conclusions:  1. Severe 2 vessel obstructive CAD 2. Patent stent in the distal LCx. 3. Successful stenting of the proximal LAD with DES 4. Successful stenting of the mid RCA with DES.  Recommendations:  DAPT for one year. Risk factor modification. Anticipate DC in am.    ASSESSMENT AND PLAN  Jeffery Schmidt is a 73 y.o. male with Past medical history of CAD s/p PTCA in 1998; stenting to LCx, HTN, and HLD who was admitted on 05/06/14 with exertional chest pain.   Confusion- Cardiology called to see pt post PCI. Pt confused at times, seems to come and go and he becomes frustrated. He  is hard of hearing as well. This behavior is not anywhere near his normal per family.Only could rep Discussed with Dr. Aundra Dubin and stat non contrast CT head was ordered and neuro consulted. IV hydralazine given as well for elevated BP. -- Seen by neuro with intermittent confusion but non focal neuro-exam. -- Was uncooperative for CT head last night. Now much more oriented and will send down for CT.  CAD/[place]:  Myoview stress test was negative for ischemia in 12/2012; 2D echo overall normal with EF 60-65% and mild LV diastolic dysfunction -- Troponins negative x 3 -- s/p LHC on 05/07/14 with   1. Severe 2 vessel obstructive CAD  2. Patent stent in the distal LCx.  3. Successful stenting of the proximl LAD with DES  4. Successful stenting of the mid RCA with DES.  -- Cont asa, statin, zetia, bb, arb. Continue DAPT with ASA and Effient for at least 1 year.  WIth possible TIA with AMS will convert Effient to Brilinta now.   Hypertension: was elevated and required Required IV hydralazine for elevated BP. BP still elevated. Will monitor after AM meds.  -- Continue amlodipine, atenolol, arb/hctz combo.  Hyperlipidemia: continue statin  DM: Hgb A1C 7.2. Will need close f/u with is PCP.   Dispo- discharge per IM. Will arrange follow up  with Dr. [place].   Jeffery Pimple PA-C  Pager [phone] Patient seen and examined and history reviewed. Agree with above findings and plan. Patient had acute behavioral changes post PCI last pm. Agitated, confused, hearing loss. Complained of HA. Patient is back this am. Completely oriented. States he got "lost". Remembers clearly events of yesterday. No focal neurologic findings now. Awaiting CT results. No chest pain. BP elevated but hasn't received am meds yet. Acute neurologic changes may be related to sedation during procedure although stroke/TIA a concern. Will switch Effient to Brilinta. Continue other therapy. Check CT. Neuro to reassess today. Would not DC today. Will see how BP responds to am meds. Do not want to acutely lower BP with neuro event. Started high dose statin with lipitor 80 mg daily.   Jeffery Schmidt, [place] 05/08/2014 7:22 AM

## 2014-05-08 NOTE — Progress Notes (Signed)
CARDIAC REHAB PHASE I   PRE:  Rate/Rhythm: 52 SR  BP:  Supine:   Sitting: 120/50  Standing:    SaO2:   MODE:  Ambulation: 1000 ft   POST:  Rate/Rhythm: 77 SR  BP:  Supine:   Sitting: 130/60  Standing:    SaO2: 0800-0915 Pt tolerated ambulation well without c/o of cp or SOB. VS stable, gait steady. Pt back to bed after walk. Pt oriented this am and knows that he was "lost last night." Completed stent education with pt, wife and daughter. They voice understanding. Pt is HOH and does not have his hearing aids at the hospital. He agrees to [place]. CRP in [place], will send referral.  Rodney Langton RN 05/08/2014 9:10 AM

## 2014-05-08 NOTE — Plan of Care (Signed)
Problem: Phase II Progression Outcomes Goal: Other Phase II Outcomes/Goals Outcome: Not Applicable Date Met:  05/08/14     

## 2014-05-08 NOTE — Care Management Note (Addendum)
    Page 1 of 1   05/08/2014     5:16:42 PM CARE MANAGEMENT NOTE 05/08/2014  Patient:  CAYDE, HELD   Account Number:  [id number]  Date Initiated:  05/08/2014  Documentation initiated by:  Usman Millett  Subjective/Objective Assessment:   CP     Action/Plan:   CM to follow for disposition needs   Anticipated DC Date:  05/08/2014   Anticipated DC Plan:  [place]  CM consult  Medication Assistance      Choice offered to / List presented to:             Status of service:  Completed, signed off Medicare Important Message given?   (If response is "NO", the following Medicare IM given date fields will be blank) Date Medicare IM given:   Medicare IM given by:   Date Additional Medicare IM given:   Additional Medicare IM given by:    Discharge Disposition:  HOME/SELF CARE  Per UR Regulation:  Reviewed for med. necessity/level of care/duration of stay  If discussed at [place] of Stay Meetings, dates discussed:    Comments:  Reshad Saab RN, BSN, MSHL, CCM  Nurse - Case Manager,  (Unit [phone]  05/08/2014 Brilinta Beneftis - PT COPAY WILL BE 15% OF THE DRUG PRICE AT A PREFERRED PHARM [facility]. PRIOR AUTH IS NOT REQUIRED Brilinta Card (30 day free) provided to patient Dispo Plan:  Home / Self care.

## 2014-05-08 NOTE — Progress Notes (Signed)
Subjective: Patient has cleared this morning.  No further confusion.  Family reports patient is at baseline.  Eating breakfast.    Objective: Current vital signs: BP 135/63 mmHg  Pulse 86  Temp(Src) 97.7 F (36.5 C) (Oral)  Resp 18  Ht 5\' 6"  (1.676 m)  Wt 81.2 kg (179 lb 0.2 oz)  BMI 28.91 kg/m2  SpO2 100% Vital signs in last 24 hours: Temp:  [97.7 F (36.5 C)] 97.7 F (36.5 C) (11/11 0825) Pulse Rate:  [47-86] 86 (11/11 0825) Resp:  [16-18] 18 (11/11 0825) BP: (135-185)/(62-98) 135/63 mmHg (11/11 0825) SpO2:  [94 %-100 %] 100 % (11/11 0825) Weight:  [81.2 kg (179 lb 0.2 oz)] 81.2 kg (179 lb 0.2 oz) (11/11 0000)  Intake/Output from previous day: 11/10 0701 - 11/11 0700 In: 649.6 [I.V.:649.6] Out: -  Intake/Output this shift:   Nutritional status: Diet Heart  Neurologic Exam: Mental Status: Alert, oriented, thought content appropriate.  Speech fluent without evidence of aphasia.  Able to follow 3 step commands without difficulty. Cranial Nerves: II: Discs flat bilaterally; Visual fields grossly normal, pupils equal, round, reactive to light and accommodation III,IV, VI: ptosis not present, extra-ocular motions intact bilaterally V,VII: smile symmetric, facial light touch sensation normal bilaterally VIII: hearing normal bilaterally IX,X: gag reflex present XI: bilateral shoulder shrug XII: midline tongue extension Motor: Right : Upper extremity   5/5    Left:     Upper extremity   5/5  Lower extremity   5/5     Lower extremity   5/5 Tone and bulk:normal tone throughout; no atrophy noted Cerebellar: normal finger-to-nose  Lab Results: Basic Metabolic Panel:  Recent Labs Lab 05/06/14 0318 05/07/14 0103  NA 139 141  K 3.7 3.5*  CL 101 101  CO2 22 24  GLUCOSE 214* 148*  BUN 22 22  CREATININE 1.00 1.08  CALCIUM 9.2 8.9    Liver Function Tests: No results for input(s): AST, ALT, ALKPHOS, BILITOT, PROT, ALBUMIN in the last 168 hours. No results for  input(s): LIPASE, AMYLASE in the last 168 hours. No results for input(s): AMMONIA in the last 168 hours.  CBC:  Recent Labs Lab 05/06/14 0318 05/07/14 0103  WBC 6.0 5.4  HGB 14.5 13.6  HCT 40.8 38.7*  MCV 90.5 89.4  PLT 185 166    Cardiac Enzymes:  Recent Labs Lab 05/06/14 0407 05/06/14 1055 05/06/14 1701 05/07/14 0958  CKTOTAL  --   --   --  52  CKMB  --   --   --  1.2  TROPONINI <0.30 <0.30 <0.30  --     Lipid Panel: No results for input(s): CHOL, TRIG, HDL, CHOLHDL, VLDL, LDLCALC in the last 168 hours.  CBG:  Recent Labs Lab 05/08/14 0810  GLUCAP 172*    Microbiology: No results found for this or any previous visit.  Coagulation Studies:  Recent Labs  05/06/14 0334  LABPROT 12.2  INR 0.90    Imaging: Ct Head Wo Contrast  05/08/2014   CLINICAL DATA:  Altered mental status. The patient had chest pain yesterday resulting in a cardiac catheterization last night. Hypertension.  EXAM: CT HEAD WITHOUT CONTRAST  TECHNIQUE: Contiguous axial images were obtained from the base of the skull through the vertex without intravenous contrast.  COMPARISON:  None.  FINDINGS: Diffusely high vascular density observed. The patient was not polycythemic on yesterday's CBC.  The brainstem, cerebellum, cerebral peduncles, thalamus, basal ganglia, basilar cisterns, and ventricular system appear within normal limits. Periventricular white matter  and corona radiata hypodensities favor chronic ischemic microvascular white matter disease. No intracranial hemorrhage, mass lesion, or acute CVA.  Polypoid mucoperiosteal thickening in the left maxillary sinus. Polypoid mucoperiosteal thickening in both maxillary sinuses. Mucosal swelling and possible polyps in the nasal cavity. Chronic ethmoid and left sphenoid sinusitis.  IMPRESSION: 1. Periventricular white matter and corona radiata hypodensities favor chronic ischemic microvascular white matter disease. 2. Diffusely hypervascular density  not attributable to polycythemia. Although this may be leftover contrast from yesterday's catheterization, careful correlation with the already requested basic metabolic profile is recommended to ensure that the creatinine is within normal limits and that the contrast is being cleared, in order to rule out contrast nephropathy as a cause for residual intravascular contrast 14 hours out from the cardiac catheterization. 3. Chronic paranasal sinusitis. Mucosal swelling in the nasal cavity.   Electronically Signed   By: Sherryl Barters M.D.   On: 05/08/2014 08:13    Medications:  I have reviewed the patient's current medications. Scheduled: . amLODipine  5 mg Oral Daily  . aspirin  81 mg Oral Daily  . atenolol  100 mg Oral Daily  . atorvastatin  80 mg Oral q1800  . ezetimibe  10 mg Oral Daily  . hydrochlorothiazide  25 mg Oral Daily  . ticagrelor  90 mg Oral BID    Assessment/Plan: Patient back to baseline.  Head CT performed this morning and reviewed.  Head CT shows no acute changes.  Mental status change may have been related to medications but can not rule out a shower of emboli either.  This would not have been seen on CT.  Patient on antiplatelet therapy.  Will not be able to have MRI at this time due to recent stent placement.    Recommendations: 1.  Continue antiplatelet therapy 2.  MRI of the brain in 3-4 weeks without contrast   LOS: 2 days   Alexis Goodell, MD [facility] [phone] 05/08/2014  8:48 AM

## 2014-05-08 NOTE — Progress Notes (Signed)
[facility] PROGRESS NOTE  Jeffery Schmidt UXN:[id number] DOB: [birth year] DOA: 05/06/2014 PCP: Criselda Peaches, MD   Brief Narrative: 73/M with PMH of CAD, HTN, presented to the ER with chest pain, recurrent episodes since Saturday, he has prior h/o PTCI in 1998, had myoview in 7/14 normal, since admission, d-dimer negative, troponin negative. Seen by Cardiology and to undergo LHC today  Assessment/Plan:  Chest pain, CAD -Chest pain, with recurrent episodes.  -Troponins negative -Cardiac cath done on 11/10 with placement of 2 DES stents, one in the LAD and the other one in the RCA. -On Brilinta and ASA.  Confusion -Has AMS and confusion last night, neuro consulted for the concern of post-cath CVA. -MRI not done b/c of the fresh stents.  -Neuro recommended antiplatelets therapy and do MRI of the brain in 3-4 weeks.  CAD: - h/o PCI in 1998 and normal myoview 7/15.  HTN: well controlled.   HLD: continue statin.  DM: new diagnosis  -Hgb A1C 7.2, DM coordinator consult  -po meds at DC, diet education  DVT proph: lovenox  Code Status: Full Code Family Communication: none at bedside Disposition Plan: home pending workup  Consultants:  Cardiology  HPI/Subjective: No further chest pain  Objective: Filed Vitals:   05/08/14 0825  BP: 135/63  Pulse: 86  Temp: 97.7 F (36.5 C)  Resp: 18    Intake/Output Summary (Last 24 hours) at 05/08/14 1215 Last data filed at 05/08/14 0900  Gross per 24 hour  Intake  889.6 ml  Output      0 ml  Net  889.6 ml   Filed Weights   05/06/14 1547 05/07/14 0400 05/08/14 0000  Weight: 81.194 kg (179 lb) 81.2 kg (179 lb 0.2 oz) 81.2 kg (179 lb 0.2 oz)    Exam:   General:  AAOx3  Cardiovascular: S1S2/RRR  Respiratory: CTAB  Abdomen: soft, NT, BS present  Musculoskeletal: no edema    Data Reviewed: Basic Metabolic Panel:  Recent Labs Lab 05/06/14 0318 05/07/14 0103 05/08/14 0912  NA 139 141 137  K 3.7 3.5*  3.4*  CL 101 101 98  CO2 22 24 21   GLUCOSE 214* 148* 206*  BUN 22 22 20   CREATININE 1.00 1.08 1.26  CALCIUM 9.2 8.9 8.7   Liver Function Tests: No results for input(s): AST, ALT, ALKPHOS, BILITOT, PROT, ALBUMIN in the last 168 hours. No results for input(s): LIPASE, AMYLASE in the last 168 hours. No results for input(s): AMMONIA in the last 168 hours. CBC:  Recent Labs Lab 05/06/14 0318 05/07/14 0103 05/08/14 0912  WBC 6.0 5.4 8.7  HGB 14.5 13.6 13.7  HCT 40.8 38.7* 39.6  MCV 90.5 89.4 92.1  PLT 185 166 212   Cardiac Enzymes:  Recent Labs Lab 05/06/14 0407 05/06/14 1055 05/06/14 1701 05/07/14 0958 05/08/14 0912  CKTOTAL  --   --   --  52 86  CKMB  --   --   --  1.2 1.6  TROPONINI <0.30 <0.30 <0.30  --   --    BNP (last 3 results) No results for input(s): PROBNP in the last 8760 hours. CBG:  Recent Labs Lab 05/08/14 0810  GLUCAP 172*    No results found for this or any previous visit (from the past 240 hour(s)).   Studies: Ct Head Wo Contrast  05/08/2014   CLINICAL DATA:  Altered mental status. The patient had chest pain yesterday resulting in a cardiac catheterization last night. Hypertension.  EXAM: CT HEAD WITHOUT CONTRAST  TECHNIQUE: Contiguous axial images were obtained from the base of the skull through the vertex without intravenous contrast.  COMPARISON:  None.  FINDINGS: Diffusely high vascular density observed. The patient was not polycythemic on yesterday's CBC.  The brainstem, cerebellum, cerebral peduncles, thalamus, basal ganglia, basilar cisterns, and ventricular system appear within normal limits. Periventricular white matter and corona radiata hypodensities favor chronic ischemic microvascular white matter disease. No intracranial hemorrhage, mass lesion, or acute CVA.  Polypoid mucoperiosteal thickening in the left maxillary sinus. Polypoid mucoperiosteal thickening in both maxillary sinuses. Mucosal swelling and possible polyps in the nasal  cavity. Chronic ethmoid and left sphenoid sinusitis.  IMPRESSION: 1. Periventricular white matter and corona radiata hypodensities favor chronic ischemic microvascular white matter disease. 2. Diffusely hypervascular density not attributable to polycythemia. Although this may be leftover contrast from yesterday's catheterization, careful correlation with the already requested basic metabolic profile is recommended to ensure that the creatinine is within normal limits and that the contrast is being cleared, in order to rule out contrast nephropathy as a cause for residual intravascular contrast 14 hours out from the cardiac catheterization. 3. Chronic paranasal sinusitis. Mucosal swelling in the nasal cavity.   Electronically Signed   By: Sherryl Barters M.D.   On: 05/08/2014 08:13    Scheduled Meds: . amLODipine  5 mg Oral Daily  . aspirin  81 mg Oral Daily  . atenolol  100 mg Oral Daily  . atorvastatin  80 mg Oral q1800  . ezetimibe  10 mg Oral Daily  . hydrochlorothiazide  25 mg Oral Daily  . potassium chloride  40 mEq Oral Once  . ticagrelor  90 mg Oral BID   Continuous Infusions: . sodium chloride     Antibiotics Given (last 72 hours)    None      Principal Problem:   Chest pain Active Problems:   CAD (coronary artery disease)   HTN (hypertension)   HLD (hyperlipidemia)   Pain in the chest   Unstable angina   Type 2 diabetes mellitus with other specified complication   Altered mental status    Time spent: 38min    Quintell Bonnin A  [facility] Pager [phone]. If 7PM-7AM, please contact night-coverage at [email or web address], password [facility] 05/08/2014, 12:15 PM  LOS: 2 days

## 2014-05-09 ENCOUNTER — Telehealth: Payer: Self-pay | Admitting: Cardiology

## 2014-05-09 LAB — BASIC METABOLIC PANEL
ANION GAP: 13 (ref 5–15)
BUN: 19 mg/dL (ref 6–23)
CO2: 25 mEq/L (ref 19–32)
Calcium: 8.9 mg/dL (ref 8.4–10.5)
Chloride: 102 mEq/L (ref 96–112)
Creatinine, Ser: 1.36 mg/dL — ABNORMAL HIGH (ref 0.50–1.35)
GFR, EST AFRICAN AMERICAN: 58 mL/min — AB (ref >90)
GFR, EST NON AFRICAN AMERICAN: 50 mL/min — AB (ref >90)
Glucose, Bld: 129 mg/dL — ABNORMAL HIGH (ref 70–99)
POTASSIUM: 3.9 meq/L (ref 3.7–5.3)
SODIUM: 140 meq/L (ref 137–147)

## 2014-05-09 LAB — CBC
HCT: 38.7 % — ABNORMAL LOW (ref 39.0–52.0)
Hemoglobin: 13.6 g/dL (ref 13.0–17.0)
MCH: 32.1 pg (ref 26.0–34.0)
MCHC: 35.1 g/dL (ref 30.0–36.0)
MCV: 91.3 fL (ref 78.0–100.0)
PLATELETS: 167 10*3/uL (ref 150–400)
RBC: 4.24 MIL/uL (ref 4.22–5.81)
RDW: 13 % (ref 11.5–15.5)
WBC: 6.2 10*3/uL (ref 4.0–10.5)

## 2014-05-09 LAB — HEPATIC FUNCTION PANEL
ALK PHOS: 57 U/L (ref 39–117)
ALT: 18 U/L (ref 0–53)
AST: 22 U/L (ref 0–37)
Albumin: 3.3 g/dL — ABNORMAL LOW (ref 3.5–5.2)
BILIRUBIN TOTAL: 1.4 mg/dL — AB (ref 0.3–1.2)
Bilirubin, Direct: 0.2 mg/dL (ref 0.0–0.3)
Indirect Bilirubin: 1.2 mg/dL — ABNORMAL HIGH (ref 0.3–0.9)
Total Protein: 6.2 g/dL (ref 6.0–8.3)

## 2014-05-09 LAB — LIPID PANEL
CHOL/HDL RATIO: 4.8 ratio
Cholesterol: 173 mg/dL (ref 0–200)
HDL: 36 mg/dL — ABNORMAL LOW (ref >39)
LDL Cholesterol: 93 mg/dL (ref 0–99)
Triglycerides: 222 mg/dL — ABNORMAL HIGH (ref <150)
VLDL: 44 mg/dL — ABNORMAL HIGH (ref 0–40)

## 2014-05-09 LAB — GLUCOSE, CAPILLARY: GLUCOSE-CAPILLARY: 153 mg/dL — AB (ref 70–99)

## 2014-05-09 MED ORDER — TICAGRELOR 90 MG PO TABS: 90.0000 mg | ORAL_TABLET | Freq: Two times a day (BID) | ORAL | Status: DC

## 2014-05-09 MED ORDER — METFORMIN HCL 500 MG PO TABS: 500.0000 mg | ORAL_TABLET | Freq: Two times a day (BID) | ORAL | Status: DC

## 2014-05-09 NOTE — Discharge Summary (Signed)
Physician Discharge Summary  Jeffery Schmidt FTD:[id number] DOB: [birth year] DOA: 05/06/2014  PCP: Jeffery Peaches, MD  Admit date: 05/06/2014 Discharge date: 05/09/2014  Time spent: 40 minutes  Recommendations for Outpatient Follow-up:  1. Follow-up with primary care physician within one week. 2. Follow-up with Jeffery Schmidt within 2 week.  Discharge Diagnoses:  Principal Problem:   Unstable angina Active Problems:   CAD (coronary artery disease)   HTN (hypertension)   HLD (hyperlipidemia)   Type 2 diabetes mellitus with other specified complication   Altered mental status   Discharge Condition: stable  Diet recommendation: heart healthy diet/ carb modified diet  Filed Weights   05/07/14 0400 05/08/14 0000 05/09/14 0035  Weight: 81.2 kg (179 lb 0.2 oz) 81.2 kg (179 lb 0.2 oz) 81.8 kg (180 lb 5.4 oz)    History of present illness:  Jeffery Schmidt is a 73 y.o. male with Past medical history of CAD, HTN, HLD. The patient is presenting with complaints of chest pain. He mentions that he was in [place] Saturday and was driving back to [place] at which time he started having complaints of right-sided burning chest pain after having the Meal. He mentions that the pain occurred on and off throughout the day on Saturday as well as and Sunday. And since it reoccurred on Sunday he decided to come to the hospital for further workup. The pain he describes as substernal 2 on the right side feels like burning crushing pain associated with exertion and improved with rest. He denies any nausea vomiting fever or chills shortness of breath diarrhea constipation abdominal pain and acid reflux. He has always had some swelling in his legs but denies any leg tenderness. No recent change in his medication reported.  The patient is coming from home And at his baseline independent for most of his ADL.  Hospital Course:   Chest pain, CAD -Chest pain, with recurrent episodes.   -Troponins negative -Cardiac cath done on 11/10 with placement of 2 DES stents, one in the LAD and the other one in the RCA. -per cardiology continue purulent and aspirin.  Confusion -Has AMS and confusion last night, neuro consulted for the concern of post-cath CVA. -MRI not done b/c of the recently placed stents.  -Neuro recommended antiplatelets therapy and do MRI of the brain in 3-4 weeks. -confusion resolved completely prior to discharge, suspect this is likely to medication post cath.  CAD: -Now with PCI and placement of 2 stents, discharge on Brilinta and aspirin.  HTN:  -Well controlled.   HLD:  -Continue statin.  DM: new diagnosis -Hgb A1C 7.2, DM coordinator consult -Carbohydrate modified diet, discharged on low-dose of metformin of 500 mg twice a day. -Needs referral to ophthalmologist for dilated eye exam..  Procedures:  Left heart catheterization was placement of 2 stents, one stent in the LAD and the other in the RCA.  Consultations:  cardiology  Discharge Exam: Filed Vitals:   05/09/14 0829  BP: 131/75  Pulse: 59  Temp: 98 F (36.7 C)  Resp: 20   General: Alert and awake, oriented x3, not in any acute distress. HEENT: anicteric sclera, pupils reactive to light and accommodation, EOMI CVS: S1-S2 clear, no murmur rubs or gallops Chest: clear to auscultation bilaterally, no wheezing, rales or rhonchi Abdomen: soft nontender, nondistended, normal bowel sounds, no organomegaly Extremities: no cyanosis, clubbing or edema noted bilaterally Neuro: Cranial nerves II-XII intact, no focal neurological deficits  Discharge Instructions You were cared for by a  hospitalist during your hospital stay. If you have any questions about your discharge medications or the care you received while you were in the hospital after you are discharged, you can call the unit and asked to speak with the hospitalist on call if the hospitalist that took care of you is not available.  Once you are discharged, your primary care physician will handle any further medical issues. Please note that NO REFILLS for any discharge medications will be authorized once you are discharged, as it is imperative that you return to your primary care physician (or establish a relationship with a primary care physician if you do not have one) for your aftercare needs so that they can reassess your need for medications and monitor your lab values.  Discharge Instructions    Amb Referral to Cardiac Rehabilitation    Complete by:  As directed      Ambulatory referral to Nutrition and Diabetic Education    Complete by:  As directed   A1c 7.2%.  New diagnosis of DM.  PCP: Dr. Levin Erp.     Diet - low sodium heart healthy    Complete by:  As directed      Increase activity slowly    Complete by:  As directed           Current Discharge Medication List    START taking these medications   Details  metFORMIN (GLUCOPHAGE) 500 MG tablet Take 1 tablet (500 mg total) by mouth 2 (two) times daily with a meal. Qty: 60 tablet, Refills: 0    ticagrelor (BRILINTA) 90 MG TABS tablet Take 1 tablet (90 mg total) by mouth 2 (two) times daily. Qty: 60 tablet, Refills: 0      CONTINUE these medications which have NOT CHANGED   Details  amLODipine (NORVASC) 5 MG tablet Take 5 mg by mouth daily.    Ascorbic Acid (VITAMIN C PO) Take 1 tablet by mouth daily.    aspirin 81 MG tablet Take 81 mg by mouth daily.    atenolol (TENORMIN) 100 MG tablet Take 100 mg by mouth daily.    ezetimibe (ZETIA) 10 MG tablet Take 10 mg by mouth daily.    Glucos-Chondroit-Hyaluron-MSM (GLUCOSAMINE CHONDROITIN JOINT PO) Take 1 tablet by mouth daily.    losartan-hydrochlorothiazide (HYZAAR) 100-25 MG per tablet Take 1 tablet by mouth daily.    omega-3 acid ethyl esters (LOVAZA) 1 G capsule Take 1 g by mouth daily.    pravastatin (PRAVACHOL) 20 MG tablet Take 10 mg by mouth daily.       Allergies  Allergen Reactions   . Epinephrine     unknown   Follow-up Information    Follow up with GREEN, EDWIN JAY, MD In 1 week.   Specialty:  Internal Medicine   Contact information:   [place], [place] [address] [phone]       Follow up with Jeffery Martinique, MD In 2 weeks.   Specialty:  Cardiology   Contact information:   [address] [place] [address] [phone]        The results of significant diagnostics from this hospitalization (including imaging, microbiology, ancillary and laboratory) are listed below for reference.    Significant Diagnostic Studies: Dg Chest 2 View  05/06/2014   CLINICAL DATA:  Burning of right chest pain.  EXAM: CHEST  2 VIEW  COMPARISON:  PA and lateral chest 10/03/2006.  FINDINGS: Lung volumes are low but the lungs are  clear with a punctate calcified granuloma in the right middle lobe unchanged. Heart size is normal. No pneumothorax or pleural effusion.  IMPRESSION: No acute finding in a low volume chest.   Electronically Signed   By: Inge Rise M.D.   On: 05/06/2014 03:54   Ct Head Wo Contrast  05/08/2014   CLINICAL DATA:  Altered mental status. The patient had chest pain yesterday resulting in a cardiac catheterization last night. Hypertension.  EXAM: CT HEAD WITHOUT CONTRAST  TECHNIQUE: Contiguous axial images were obtained from the base of the skull through the vertex without intravenous contrast.  COMPARISON:  None.  FINDINGS: Diffusely high vascular density observed. The patient was not polycythemic on yesterday's CBC.  The brainstem, cerebellum, cerebral peduncles, thalamus, basal ganglia, basilar cisterns, and ventricular system appear within normal limits. Periventricular white matter and corona radiata hypodensities favor chronic ischemic microvascular white matter disease. No intracranial hemorrhage, mass lesion, or acute CVA.  Polypoid mucoperiosteal thickening in the left maxillary sinus. Polypoid mucoperiosteal  thickening in both maxillary sinuses. Mucosal swelling and possible polyps in the nasal cavity. Chronic ethmoid and left sphenoid sinusitis.  IMPRESSION: 1. Periventricular white matter and corona radiata hypodensities favor chronic ischemic microvascular white matter disease. 2. Diffusely hypervascular density not attributable to polycythemia. Although this may be leftover contrast from yesterday's catheterization, careful correlation with the already requested basic metabolic profile is recommended to ensure that the creatinine is within normal limits and that the contrast is being cleared, in order to rule out contrast nephropathy as a cause for residual intravascular contrast 14 hours out from the cardiac catheterization. 3. Chronic paranasal sinusitis. Mucosal swelling in the nasal cavity.   Electronically Signed   By: Sherryl Barters M.D.   On: 05/08/2014 08:13    Microbiology: No results found for this or any previous visit (from the past 240 hour(s)).   Labs: Basic Metabolic Panel:  Recent Labs Lab 05/06/14 0318 05/07/14 0103 05/08/14 0912 05/09/14 0244  NA 139 141 137 140  K 3.7 3.5* 3.4* 3.9  CL 101 101 98 102  CO2 22 24 21 25   GLUCOSE 214* 148* 206* 129*  BUN 22 22 20 19   CREATININE 1.00 1.08 1.26 1.36*  CALCIUM 9.2 8.9 8.7 8.9   Liver Function Tests:  Recent Labs Lab 05/09/14 0244  AST 22  ALT 18  ALKPHOS 57  BILITOT 1.4*  PROT 6.2  ALBUMIN 3.3*   No results for input(s): LIPASE, AMYLASE in the last 168 hours. No results for input(s): AMMONIA in the last 168 hours. CBC:  Recent Labs Lab 05/06/14 0318 05/07/14 0103 05/08/14 0912 05/09/14 0244  WBC 6.0 5.4 8.7 6.2  HGB 14.5 13.6 13.7 13.6  HCT 40.8 38.7* 39.6 38.7*  MCV 90.5 89.4 92.1 91.3  PLT 185 166 212 167   Cardiac Enzymes:  Recent Labs Lab 05/06/14 0407 05/06/14 1055 05/06/14 1701 05/07/14 0958 05/08/14 0912  CKTOTAL  --   --   --  52 86  CKMB  --   --   --  1.2 1.6  TROPONINI <0.30  <0.30 <0.30  --   --    BNP: BNP (last 3 results) No results for input(s): PROBNP in the last 8760 hours. CBG:  Recent Labs Lab 05/08/14 0810 05/08/14 1328 05/08/14 1727 05/08/14 2209 05/09/14 0645  GLUCAP 172* 130* 141* 132* 153*       Signed:  Jacqulynn Shappell A  [facility] 05/09/2014, 9:43 AM

## 2014-05-09 NOTE — Progress Notes (Signed)
Patient Name: Jeffery Schmidt Date of Encounter: 05/09/2014  Principal Problem:   Unstable angina Active Problems:   CAD (coronary artery disease)   HTN (hypertension)   HLD (hyperlipidemia)   Type 2 diabetes mellitus with other specified complication   Altered mental status   Hypokalemia    Primary Cardiologist: Dr. Martinique  Patient Profile: 73 year old male with a history of hypertension, hyperlipidemia and CAD was admitted 11/09 with exertional chest pain. Cath 11/10 with PTCA and stenting of the proximal LAD and mid RCA. Post-procedure, patient had confusion and was seen by neuro.  SUBJECTIVE: Confusion has resolved. He remembers very little about the last day or so. He feels fine today.  OBJECTIVE Filed Vitals:   05/08/14 1721 05/08/14 2031 05/09/14 0035 05/09/14 0604  BP: 145/59 135/57 129/66 136/81  Pulse: 64 63 48 52  Temp: 98 F (36.7 C) 98 F (36.7 C) 97.9 F (36.6 C) 98 F (36.7 C)  TempSrc: Oral Oral Oral Oral  Resp: 18 16 18 20   Height:      Weight:   180 lb 5.4 oz (81.8 kg)   SpO2: 100% 94% 93% 96%    Intake/Output Summary (Last 24 hours) at 05/09/14 0656 Last data filed at 05/09/14 0604  Gross per 24 hour  Intake  967.5 ml  Output   1075 ml  Net -107.5 ml   Filed Weights   05/07/14 0400 05/08/14 0000 05/09/14 0035  Weight: 179 lb 0.2 oz (81.2 kg) 179 lb 0.2 oz (81.2 kg) 180 lb 5.4 oz (81.8 kg)    PHYSICAL EXAM General: Well developed, well nourished, male in no acute distress. Head: Normocephalic, atraumatic.  Neck: Supple without bruits, JVD not elevated Lungs:  Resp regular and unlabored, CTA. Heart: RRR, S1, S2, no S3, S4, very soft murmur; no rub. Abdomen: Soft, non-tender, non-distended, BS + x 4.  Extremities: No clubbing, cyanosis, no edema. Right radial cath site without ecchymosis or hematoma Neuro: Alert and oriented X 3. Moves all extremities spontaneously. Psych: Normal affect.  LABS: CBC:  Recent Labs  05/08/14 0912  05/09/14 0244  WBC 8.7 6.2  HGB 13.7 13.6  HCT 39.6 38.7*  MCV 92.1 91.3  PLT 212 203   Basic Metabolic Panel:  Recent Labs  05/08/14 0912 05/09/14 0244  NA 137 140  K 3.4* 3.9  CL 98 102  CO2 21 25  GLUCOSE 206* 129*  BUN 20 19  CREATININE 1.26 1.36*  CALCIUM 8.7 8.9   Liver Function Tests:No results for input(s): AST, ALT, ALKPHOS, BILITOT, PROT, ALBUMIN in the last 72 hours. Cardiac Enzymes:  Recent Labs  05/06/14 1055 05/06/14 1701 05/07/14 0958 05/08/14 0912  CKTOTAL  --   --  52 86  CKMB  --   --  1.2 1.6  TROPONINI <0.30 <0.30  --   --    Hemoglobin A1C:  Recent Labs  05/06/14 1055  HGBA1C 7.2*   Fasting Lipid Panel:No results for input(s): CHOL, HDL, LDLCALC, TRIG, CHOLHDL, LDLDIRECT in the last 72 hours.  TELE:  SR, Sinus brady, high 40s at times.    ECG:   Radiology/Studies: Ct Head Wo Contrast 05/08/2014   CLINICAL DATA:  Altered mental status. The patient had chest pain yesterday resulting in a cardiac catheterization last night. Hypertension.  EXAM: CT HEAD WITHOUT CONTRAST  TECHNIQUE: Contiguous axial images were obtained from the base of the skull through the vertex without intravenous contrast.  COMPARISON:  None.  FINDINGS: Diffusely high vascular  density observed. The patient was not polycythemic on yesterday's CBC.  The brainstem, cerebellum, cerebral peduncles, thalamus, basal ganglia, basilar cisterns, and ventricular system appear within normal limits. Periventricular white matter and corona radiata hypodensities favor chronic ischemic microvascular white matter disease. No intracranial hemorrhage, mass lesion, or acute CVA.  Polypoid mucoperiosteal thickening in the left maxillary sinus. Polypoid mucoperiosteal thickening in both maxillary sinuses. Mucosal swelling and possible polyps in the nasal cavity. Chronic ethmoid and left sphenoid sinusitis.  IMPRESSION: 1. Periventricular white matter and corona radiata hypodensities favor chronic  ischemic microvascular white matter disease. 2. Diffusely hypervascular density not attributable to polycythemia. Although this may be leftover contrast from yesterday's catheterization, careful correlation with the already requested basic metabolic profile is recommended to ensure that the creatinine is within normal limits and that the contrast is being cleared, in order to rule out contrast nephropathy as a cause for residual intravascular contrast 14 hours out from the cardiac catheterization. 3. Chronic paranasal sinusitis. Mucosal swelling in the nasal cavity.   Electronically Signed   By: Sherryl Barters M.D.   On: 05/08/2014 08:13     Current Medications:  . amLODipine  5 mg Oral Daily  . aspirin  81 mg Oral Daily  . atenolol  100 mg Oral Daily  . atorvastatin  80 mg Oral q1800  . ezetimibe  10 mg Oral Daily  . hydrochlorothiazide  25 mg Oral Daily  . insulin aspart  0-9 Units Subcutaneous TID WC  . living well with diabetes book   Does not apply Once  . ticagrelor  90 mg Oral BID   . sodium chloride 75 mL/hr at 05/08/14 1230    ASSESSMENT AND PLAN: Principal Problem:   Unstable angina - s/p cath 11/10 with PTCA and stenting of the proximal LAD and mid RCA. 20-30 percent disease in the circumflex with a patent stent. EF 60-65 percent by echo. Continue aspirin, beta blocker, Brilinta. Patient started on high-dose statin 11/11, we will check lipid profile and LFTs. Will arrange follow-up appointment.  Active Problems:   CAD (coronary artery disease) - see above    HTN (hypertension) - Per IM    HLD (hyperlipidemia) - See above    Type 2 diabetes mellitus with other specified complication - Her I am    Altered mental status - Per Neuro, IM    Hypokalemia - per IM, supplemented and improved    Bradycardia - heart rate high 40s at times, patient asymptomatic. Currently on atenolol 100 mg daily, med changes per M.D.  Plan: Discharge by IM once medically stable, possibly  today.  Signed, Rosaria Ferries , PA-C 6:56 AM 05/09/2014  I have personally seen and examined this patient with Rosaria Ferries, PA-C.  I agree with the assessment and plan as outlined above. Pt is stable overnight. NO chest pain. Labs reviewed and ok. S/p LAD and RCA stents. Confusion resolved. Continue ASA and Brilinta. Would keep same dose of atenolol with sinus bradycardia. OK for discharge today from our standpoint. Follow up with Dr. Martinique will be arranged.   Tashiba Timoney 05/09/2014 7:17 AM

## 2014-05-09 NOTE — Progress Notes (Signed)
CARDIAC REHAB PHASE I   PRE:  Rate/Rhythm: 61 SR  BP:  Supine: 131/75  Sitting:   Standing:    SaO2: 98 RA  MODE:  Ambulation: 1000 ft   POST:  Rate/Rhythm: 77 SR  BP:  Supine:   Sitting: 152/71  Standing:    SaO2: 97 RA 0830-0855 Pt tolerated ambulation well without c/o cp or SOB. VS stable, gait steady. Reviewed discharge education with pt. He voices understanding.  Rodney Langton RN 05/09/2014 9:39 AM

## 2014-05-09 NOTE — Telephone Encounter (Signed)
Pt called in stating that he had some stents put in on 11/10 and Dr. Martinique wanted the pt to follow up with him in 2 wks(he does not have anything in 2 wks). The next avalible for a PA is 12/3 with Gaspar Bidding. Pt does not know if that is suitable when Dr. Martinique told him 2wks. Please call  Thanks

## 2014-05-10 NOTE — Telephone Encounter (Signed)
Deferred to [facility].

## 2014-05-10 NOTE — Telephone Encounter (Signed)
Returned call to patient ok to take metformin with other medications.Post hospital appointment scheduled 05/22/14 at 10:00 am with Truitt Merle NP at our [facility].05/30/14 appt with Gaspar Bidding cancelled.

## 2014-05-10 NOTE — Telephone Encounter (Signed)
Jeffery Schmidt is calling because he is to take Metformin 500mg   and the instructions states that he is not supposed to take it with any Bp medication or heart medication , he is not sure what is it that he suppose d to .[name] Please call    Thanks

## 2014-05-11 ENCOUNTER — Other Ambulatory Visit: Payer: Self-pay

## 2014-05-11 ENCOUNTER — Encounter (HOSPITAL_COMMUNITY): Payer: Self-pay | Admitting: Emergency Medicine

## 2014-05-11 DIAGNOSIS — Z888 Allergy status to other drugs, medicaments and biological substances status: Secondary | ICD-10-CM | POA: Diagnosis not present

## 2014-05-11 DIAGNOSIS — E785 Hyperlipidemia, unspecified: Secondary | ICD-10-CM | POA: Diagnosis not present

## 2014-05-11 DIAGNOSIS — E119 Type 2 diabetes mellitus without complications: Secondary | ICD-10-CM | POA: Insufficient documentation

## 2014-05-11 DIAGNOSIS — R0602 Shortness of breath: Secondary | ICD-10-CM | POA: Diagnosis not present

## 2014-05-11 DIAGNOSIS — Z7982 Long term (current) use of aspirin: Secondary | ICD-10-CM | POA: Diagnosis not present

## 2014-05-11 DIAGNOSIS — I10 Essential (primary) hypertension: Secondary | ICD-10-CM | POA: Diagnosis not present

## 2014-05-11 DIAGNOSIS — R4182 Altered mental status, unspecified: Secondary | ICD-10-CM | POA: Insufficient documentation

## 2014-05-11 DIAGNOSIS — Z87891 Personal history of nicotine dependence: Secondary | ICD-10-CM | POA: Insufficient documentation

## 2014-05-11 DIAGNOSIS — Z87442 Personal history of urinary calculi: Secondary | ICD-10-CM | POA: Insufficient documentation

## 2014-05-11 DIAGNOSIS — Z79899 Other long term (current) drug therapy: Secondary | ICD-10-CM | POA: Insufficient documentation

## 2014-05-11 DIAGNOSIS — R0789 Other chest pain: Secondary | ICD-10-CM | POA: Insufficient documentation

## 2014-05-11 DIAGNOSIS — I2510 Atherosclerotic heart disease of native coronary artery without angina pectoris: Secondary | ICD-10-CM | POA: Insufficient documentation

## 2014-05-11 DIAGNOSIS — R079 Chest pain, unspecified: Secondary | ICD-10-CM | POA: Diagnosis present

## 2014-05-11 NOTE — ED Notes (Signed)
Pt st's he had 2 cardiac stents placed on 11/10 st's he has had shortness of breath and chest pain when he lay down.  Pt denies any shortness of breath or pain when sitting up.

## 2014-05-12 ENCOUNTER — Emergency Department (HOSPITAL_COMMUNITY): Payer: Medicare Other

## 2014-05-12 ENCOUNTER — Encounter (HOSPITAL_COMMUNITY): Payer: Self-pay | Admitting: *Deleted

## 2014-05-12 ENCOUNTER — Observation Stay (HOSPITAL_COMMUNITY)
Admission: EM | Admit: 2014-05-12 | Discharge: 2014-05-13 | Disposition: A | Discharge status: Home / Self Care | Payer: Medicare Other | Attending: Cardiology | Admitting: Cardiology

## 2014-05-12 DIAGNOSIS — I10 Essential (primary) hypertension: Secondary | ICD-10-CM | POA: Diagnosis present

## 2014-05-12 DIAGNOSIS — R0602 Shortness of breath: Secondary | ICD-10-CM | POA: Diagnosis not present

## 2014-05-12 DIAGNOSIS — R079 Chest pain, unspecified: Secondary | ICD-10-CM

## 2014-05-12 DIAGNOSIS — I25119 Atherosclerotic heart disease of native coronary artery with unspecified angina pectoris: Secondary | ICD-10-CM | POA: Diagnosis present

## 2014-05-12 DIAGNOSIS — I2510 Atherosclerotic heart disease of native coronary artery without angina pectoris: Secondary | ICD-10-CM | POA: Diagnosis present

## 2014-05-12 DIAGNOSIS — E1169 Type 2 diabetes mellitus with other specified complication: Secondary | ICD-10-CM | POA: Diagnosis present

## 2014-05-12 LAB — COMPREHENSIVE METABOLIC PANEL
ALBUMIN: 3.8 g/dL (ref 3.5–5.2)
ALT: 24 U/L (ref 0–53)
ANION GAP: 16 — AB (ref 5–15)
AST: 21 U/L (ref 0–37)
Alkaline Phosphatase: 68 U/L (ref 39–117)
BILIRUBIN TOTAL: 1.4 mg/dL — AB (ref 0.3–1.2)
BUN: 23 mg/dL (ref 6–23)
CALCIUM: 9.8 mg/dL (ref 8.4–10.5)
CO2: 21 mEq/L (ref 19–32)
Chloride: 96 mEq/L (ref 96–112)
Creatinine, Ser: 1.25 mg/dL (ref 0.50–1.35)
GFR calc Af Amer: 64 mL/min — ABNORMAL LOW (ref >90)
GFR calc non Af Amer: 55 mL/min — ABNORMAL LOW (ref >90)
Glucose, Bld: 151 mg/dL — ABNORMAL HIGH (ref 70–99)
Potassium: 3.6 mEq/L — ABNORMAL LOW (ref 3.7–5.3)
Sodium: 133 mEq/L — ABNORMAL LOW (ref 137–147)
TOTAL PROTEIN: 7.3 g/dL (ref 6.0–8.3)

## 2014-05-12 LAB — GLUCOSE, CAPILLARY
GLUCOSE-CAPILLARY: 159 mg/dL — AB (ref 70–99)
GLUCOSE-CAPILLARY: 124 mg/dL — AB (ref 70–99)
Glucose-Capillary: 118 mg/dL — ABNORMAL HIGH (ref 70–99)

## 2014-05-12 LAB — CBC WITH DIFFERENTIAL/PLATELET
BASOS ABS: 0.1 10*3/uL (ref 0.0–0.1)
BASOS PCT: 1 % (ref 0–1)
EOS ABS: 0.2 10*3/uL (ref 0.0–0.7)
Eosinophils Relative: 3 % (ref 0–5)
HCT: 42.2 % (ref 39.0–52.0)
Hemoglobin: 14.8 g/dL (ref 13.0–17.0)
LYMPHS ABS: 2.4 10*3/uL (ref 0.7–4.0)
LYMPHS PCT: 27 % (ref 12–46)
MCH: 32.1 pg (ref 26.0–34.0)
MCHC: 35.1 g/dL (ref 30.0–36.0)
MCV: 91.5 fL (ref 78.0–100.0)
Monocytes Absolute: 0.6 10*3/uL (ref 0.1–1.0)
Monocytes Relative: 7 % (ref 3–12)
NEUTROS ABS: 5.7 10*3/uL (ref 1.7–7.7)
NEUTROS PCT: 62 % (ref 43–77)
PLATELETS: 250 10*3/uL (ref 150–400)
RBC: 4.61 MIL/uL (ref 4.22–5.81)
RDW: 12.8 % (ref 11.5–15.5)
WBC: 9 10*3/uL (ref 4.0–10.5)

## 2014-05-12 LAB — PRO B NATRIURETIC PEPTIDE: Pro B Natriuretic peptide (BNP): 161.8 pg/mL — ABNORMAL HIGH (ref 0–125)

## 2014-05-12 LAB — CBG MONITORING, ED: Glucose-Capillary: 160 mg/dL — ABNORMAL HIGH (ref 70–99)

## 2014-05-12 LAB — TROPONIN I: Troponin I: <0.3 ng/mL (ref <0.30)

## 2014-05-12 MED ORDER — HYDROCHLOROTHIAZIDE 25 MG PO TABS
25.0000 mg | ORAL_TABLET | Freq: Every day | ORAL | Status: DC
  Administered 2014-05-12 – 2014-05-13 (×2): 25 mg via ORAL
  Filled 2014-05-12 (×2): qty 1

## 2014-05-12 MED ORDER — OMEGA-3-ACID ETHYL ESTERS 1 G PO CAPS
1.0000 g | ORAL_CAPSULE | Freq: Every day | ORAL | Status: DC
  Administered 2014-05-12 – 2014-05-13 (×2): 1 g via ORAL
  Filled 2014-05-12 (×2): qty 1

## 2014-05-12 MED ORDER — PRAVASTATIN SODIUM 10 MG PO TABS
10.0000 mg | ORAL_TABLET | Freq: Every day | ORAL | Status: DC
  Administered 2014-05-12: 10 mg via ORAL
  Filled 2014-05-12 (×2): qty 1

## 2014-05-12 MED ORDER — VITAMIN C 500 MG PO TABS
500.0000 mg | ORAL_TABLET | Freq: Every day | ORAL | Status: DC
Start: 2014-05-12 — End: 2014-05-13
  Administered 2014-05-12 – 2014-05-13 (×2): 500 mg via ORAL
  Filled 2014-05-12 (×2): qty 1

## 2014-05-12 MED ORDER — ATENOLOL 100 MG PO TABS
100.0000 mg | ORAL_TABLET | Freq: Every day | ORAL | Status: DC
  Filled 2014-05-12: qty 1

## 2014-05-12 MED ORDER — GI COCKTAIL ~~LOC~~
30.0000 mL | Freq: Four times a day (QID) | ORAL | Status: DC | PRN
  Filled 2014-05-12: qty 30

## 2014-05-12 MED ORDER — METFORMIN HCL 500 MG PO TABS
500.0000 mg | ORAL_TABLET | Freq: Two times a day (BID) | ORAL | Status: DC
  Administered 2014-05-12 – 2014-05-13 (×3): 500 mg via ORAL
  Filled 2014-05-12 (×5): qty 1

## 2014-05-12 MED ORDER — ONDANSETRON HCL 4 MG/2ML IJ SOLN: 4.0000 mg | Freq: Four times a day (QID) | INTRAMUSCULAR | Status: DC | PRN

## 2014-05-12 MED ORDER — EZETIMIBE 10 MG PO TABS
10.0000 mg | ORAL_TABLET | Freq: Every day | ORAL | Status: DC
  Administered 2014-05-12 – 2014-05-13 (×2): 10 mg via ORAL
  Filled 2014-05-12 (×2): qty 1

## 2014-05-12 MED ORDER — LOSARTAN POTASSIUM-HCTZ 100-25 MG PO TABS: 1.0000 | ORAL_TABLET | Freq: Every day | ORAL | Status: DC

## 2014-05-12 MED ORDER — AMLODIPINE BESYLATE 5 MG PO TABS
5.0000 mg | ORAL_TABLET | Freq: Every day | ORAL | Status: DC
  Administered 2014-05-12 – 2014-05-13 (×2): 5 mg via ORAL
  Filled 2014-05-12 (×2): qty 1

## 2014-05-12 MED ORDER — ACETAMINOPHEN 325 MG PO TABS
650.0000 mg | ORAL_TABLET | ORAL | Status: DC | PRN
Start: 2014-05-12 — End: 2014-05-13

## 2014-05-12 MED ORDER — ASPIRIN EC 81 MG PO TBEC
81.0000 mg | DELAYED_RELEASE_TABLET | Freq: Every day | ORAL | Status: DC
  Administered 2014-05-12 – 2014-05-13 (×2): 81 mg via ORAL
  Filled 2014-05-12 (×2): qty 1

## 2014-05-12 MED ORDER — LOSARTAN POTASSIUM 50 MG PO TABS
100.0000 mg | ORAL_TABLET | Freq: Every day | ORAL | Status: DC
  Administered 2014-05-12 – 2014-05-13 (×2): 100 mg via ORAL
  Filled 2014-05-12 (×2): qty 2

## 2014-05-12 MED ORDER — ATENOLOL 100 MG PO TABS
100.0000 mg | ORAL_TABLET | Freq: Every day | ORAL | Status: DC
  Administered 2014-05-13: 100 mg via ORAL
  Filled 2014-05-12: qty 1

## 2014-05-12 NOTE — Progress Notes (Addendum)
Dr. Radford Pax paged. Jeffery Schmidt 11:14 AM   Per Dr. Radford Pax, hold atenolol for HR <60. Jeffery Schmidt 11:24 AM

## 2014-05-12 NOTE — Plan of Care (Signed)
Problem: Phase I Progression Outcomes Goal: Hemodynamically stable Outcome: Completed/Met Date Met:  05/12/14     

## 2014-05-12 NOTE — ED Provider Notes (Signed)
CSN: [id number]     Arrival date & time 05/11/14  2333 History   First MD Initiated Contact with Patient 05/12/14 [phone]     Chief Complaint  Patient presents with  . Shortness of Breath     (Consider location/radiation/quality/duration/timing/severity/associated sxs/prior Treatment) HPI 73 year old male presents to the emergency department with complaint of chest pain and shortness of breath.  Patient status post heart catheterization with 2 stents placed on Tuesday.  He was discharged from the hospital on Thursday.  Patient reports while he was admitted, he had periods of apnea and was told he probably had sleep apnea.  Since being home, he has had continued apnea, and when he wakes and struggles to breathe tonight he has had chest pain.  Patient reports no improvement with sleeping in a recliner.  No dyspnea on exertion no swollen legs no history of PE or DVT or CHF.  Patient reports while he was in the hospital he had problems with shortness of breath, but while on oxygen he had no difficulties.  Patient reports he was told by nursing staff that he had problems with the diet was given caffeine, but had problems with the caffeine, was unable to continue it.  Patient was also noted to be a new onset diabetic started on metformin as well as [place].  Patient was concerned tonight as he has not been able to sleep without waking struggling to breathe and having chest pain.  He does not have follow-up with his primary care doctor or cardiologist for some time.  He does not have a sleep apnea study scheduled.  Upon arrival to the emergency department while having blood drawn patient became diaphoretic and felt nauseated, had near-syncope in triage room.  He denies ever having this reaction before. Past Medical History  Diagnosis Date  . HTN (hypertension)   . HLD (hyperlipidemia)   . Kidney stones   . CAD (coronary artery disease)     a. 1998 s/p PCI;  b. 12/2012 MV: EF 68%, no ischemia.   Past  Surgical History  Procedure Laterality Date  . Lithotripsy  1990's  . Nephrostomy  1990's  . Coronary angioplasty  1998  . Blepharoplasty Bilateral 2000's  . Coronary angioplasty with stent placement     Family History  Problem Relation Age of Onset  . Coronary artery disease    . Heart disease    . Hypertension    . Stroke    . Hypertension Mother    History  Substance Use Topics  . Smoking status: Former Research scientist (life sciences)  . Smokeless tobacco: Never Used     Comment: "smoked cigarettes a thousand years ago; maybe a cigarette qod for a time"  . Alcohol Use: No    Review of Systems   See History of Present Illness; otherwise all other systems are reviewed and negative  Allergies  Epinephrine  Home Medications   Prior to Admission medications   Medication Sig Start Date End Date Taking? Authorizing Provider  amLODipine (NORVASC) 5 MG tablet Take 5 mg by mouth daily.    Historical Provider, MD  Ascorbic Acid (VITAMIN C PO) Take 1 tablet by mouth daily.    Historical Provider, MD  aspirin 81 MG tablet Take 81 mg by mouth daily.    Historical Provider, MD  atenolol (TENORMIN) 100 MG tablet Take 100 mg by mouth daily.    Historical Provider, MD  ezetimibe (ZETIA) 10 MG tablet Take 10 mg by mouth daily.    Historical Provider,  MD  Glucos-Chondroit-Hyaluron-MSM (GLUCOSAMINE CHONDROITIN JOINT PO) Take 1 tablet by mouth daily.    Historical Provider, MD  losartan-hydrochlorothiazide (HYZAAR) 100-25 MG per tablet Take 1 tablet by mouth daily.    Historical Provider, MD  metFORMIN (GLUCOPHAGE) 500 MG tablet Take 1 tablet (500 mg total) by mouth 2 (two) times daily with a meal. 05/09/14   Verlee Monte, MD  omega-3 acid ethyl esters (LOVAZA) 1 G capsule Take 1 g by mouth daily.    Historical Provider, MD  pravastatin (PRAVACHOL) 20 MG tablet Take 10 mg by mouth daily.    Historical Provider, MD  ticagrelor (BRILINTA) 90 MG TABS tablet Take 1 tablet (90 mg total) by mouth 2 (two) times daily.  05/09/14   Mutaz Elmahi, MD   BP 120/67 mmHg  Pulse 55  Temp(Src) 97.6 F (36.4 C) (Oral)  Resp 10  Ht 5\' 6"  (1.676 m)  Wt 170 lb (77.111 kg)  BMI 27.45 kg/m2  SpO2 97% Physical Exam  Constitutional: He is oriented to person, place, and time. He appears well-developed and well-nourished.  HENT:  Head: Normocephalic and atraumatic.  Nose: Nose normal.  Mouth/Throat: Oropharynx is clear and moist.  Eyes: Conjunctivae and EOM are normal. Pupils are equal, round, and reactive to light.  Neck: Normal range of motion. Neck supple. No JVD present. No tracheal deviation present. No thyromegaly present.  Cardiovascular: Normal rate, regular rhythm, normal heart sounds and intact distal pulses.  Exam reveals no gallop and no friction rub.   No murmur heard. Pulmonary/Chest: Effort normal and breath sounds normal. No stridor. No respiratory distress. He has no wheezes. He has no rales. He exhibits no tenderness.  Abdominal: Soft. Bowel sounds are normal. He exhibits no distension and no mass. There is no tenderness. There is no rebound and no guarding.  Musculoskeletal: Normal range of motion. He exhibits no edema or tenderness.  Lymphadenopathy:    He has no cervical adenopathy.  Neurological: He is alert and oriented to person, place, and time. He displays normal reflexes. He exhibits normal muscle tone. Coordination normal.  Skin: Skin is warm and dry. No rash noted. No erythema. No pallor.  Psychiatric: He has a normal mood and affect. His behavior is normal. Judgment and thought content normal.  Nursing note and vitals reviewed.   ED Course  Procedures (including critical care time) Labs Review Labs Reviewed  COMPREHENSIVE METABOLIC PANEL - Abnormal; Notable for the following:    Sodium 133 (*)    Potassium 3.6 (*)    Glucose, Bld 151 (*)    Total Bilirubin 1.4 (*)    GFR calc non Af Amer 55 (*)    GFR calc Af Amer 64 (*)    Anion gap 16 (*)    All other components within normal  limits  CBG MONITORING, ED - Abnormal; Notable for the following:    Glucose-Capillary 160 (*)    All other components within normal limits  CBC WITH DIFFERENTIAL  TROPONIN I  I-STAT TROPOININ, ED    Imaging Review Dg Chest 2 View  05/12/2014   CLINICAL DATA:  Shortness of breath, chest pain  EXAM: CHEST  2 VIEW  COMPARISON:  05/06/2014  FINDINGS: The heart size and mediastinal contours are within normal limits. Both lungs are clear. The visualized skeletal structures are unremarkable.  IMPRESSION: No active cardiopulmonary disease.   Electronically Signed   By: Kathreen Devoid   On: 05/12/2014 01:36     EKG Interpretation None  Date: 05/11/2014  Rate: 61  Rhythm: normal sinus rhythm  QRS Axis: normal  Intervals: PR prolonged  ST/T Wave abnormalities: normal  Conduction Disutrbances:first-degree A-V block   Narrative Interpretation:   Old EKG Reviewed: none available   MDM   Final diagnoses:  Chest pain    73 year old male with questionable sleep apnea, chest pain as he struggles to breathe, 5 days out from 2 stents.  Workup here in the emergency department is unremarkable.  Discussed with cardiology who will see the patient.   Kalman Drape, MD 05/12/14 [phone]

## 2014-05-12 NOTE — Progress Notes (Signed)
Atenolol held per nursing judgement, pt HR 54 at present on tele, HR 58 at med pass time, will discuss with MD. Jeffery Schmidt 10:42 AM

## 2014-05-12 NOTE — Progress Notes (Signed)
Utilization Review Completed.   Cordney Barstow, RN, BSN Nurse Case Manager  

## 2014-05-12 NOTE — H&P (Signed)
Patient ID: Jeffery Schmidt MRN: [id number], DOB/AGE: 1940-10-14   Admit date: 05/12/2014   Primary Physician: Jeffery Peaches, MD Primary Cardiologist: Jeffery Martinique, MD  Pt. Profile:  73M with DM, HTN, HLD, CAD s/p PCI to RCA and LAD (05/07/14) who presents with dyspnea and chest pain.   Problem List  Past Medical History  Diagnosis Date  . HTN (hypertension)   . HLD (hyperlipidemia)   . Kidney stones   . CAD (coronary artery disease)     a. 1998 s/p PCI;  b. 12/2012 MV: EF 68%, no ischemia.    Past Surgical History  Procedure Laterality Date  . Lithotripsy  1990's  . Nephrostomy  1990's  . Coronary angioplasty  1998  . Blepharoplasty Bilateral 2000's  . Coronary angioplasty with stent placement       Allergies  Allergies  Allergen Reactions  . Epinephrine     unknown    HPI  73M with DM, HTN, HLD, CAD s/p PCI to RCA and LAD (05/07/14) who presents with dyspnea and chest pain.   Jeffery Schmidt was admitted from 05/06/14-05/09/14 for unstable angina and ultimately underwent PCI of lesions in the pLAD and mRCA. He was initially started on prasugrel but this was switched to ticagrelor in the setting of AMS that was initially thought possibly related to a CVA; AMS was ultimately attributed to an adverse reaction to the sedatives used during the cardiac cath. Head CT was unrevealing. New diagnosis of DM made. Although I cannot find good documentation of this, Jeffery Schmidt states that after starting ticagrelor, he had dyspnea and apneic spells at night which symptomatically improved with oxygen. He states the nurses told him to take caffeine to reverse the side effects of ticagrelor. Someone also apparently told him he needs a sleep study as an outpatient. Jeffery Schmidt states that since discharge, he would frequently wake up short of breath with chest tightness and his wife states he would "stop breathing," similar to the apneic episodes in the hospital. Wife denies there being  any snoring. States SOB is typically not present during the day, which he attributes to caffeine use. He states that he does notice some SOB in the AM after taking a dose of ticagrelor. He denies exertional dyspnea or chest pain or other symptoms out of the ordinary. Due to recurrent difficulty sleeping due to waking up SOB, he presented to the ED.   On arrival to the ED, Jeffery Schmidt was hemodynamically stable. 132/69, 63, 98% on RA. Labs were notable for K 3.6, Cr 1.25, TnI 0.30. CXR was without active cardiopulmonary disease. ECG demonstrated NSR, 1st degree AVB, and NSSTTWC.   Home Medications  Prior to Admission medications   Medication Sig Start Date End Date Taking? Authorizing Provider  amLODipine (NORVASC) 5 MG tablet Take 5 mg by mouth daily.    Historical Provider, MD  Ascorbic Acid (VITAMIN C PO) Take 1 tablet by mouth daily.    Historical Provider, MD  aspirin 81 MG tablet Take 81 mg by mouth daily.    Historical Provider, MD  atenolol (TENORMIN) 100 MG tablet Take 100 mg by mouth daily.    Historical Provider, MD  ezetimibe (ZETIA) 10 MG tablet Take 10 mg by mouth daily.    Historical Provider, MD  Glucos-Chondroit-Hyaluron-MSM (GLUCOSAMINE CHONDROITIN JOINT PO) Take 1 tablet by mouth daily.    Historical Provider, MD  losartan-hydrochlorothiazide (HYZAAR) 100-25 MG per tablet Take 1 tablet by mouth daily.    Historical  Provider, MD  metFORMIN (GLUCOPHAGE) 500 MG tablet Take 1 tablet (500 mg total) by mouth 2 (two) times daily with a meal. 05/09/14   Verlee Monte, MD  omega-3 acid ethyl esters (LOVAZA) 1 G capsule Take 1 g by mouth daily.    Historical Provider, MD  pravastatin (PRAVACHOL) 20 MG tablet Take 10 mg by mouth daily.    Historical Provider, MD  ticagrelor (BRILINTA) 90 MG TABS tablet Take 1 tablet (90 mg total) by mouth 2 (two) times daily. 05/09/14   Verlee Monte, MD    Family History  Family History  Problem Relation Age of Onset  . Coronary artery disease    .  Heart disease    . Hypertension    . Stroke    . Hypertension Mother     Social History  History   Social History  . Marital Status: Married    Spouse Name: N/A    Number of Children: 6  . Years of Education: N/A   Occupational History  . retired    Social History Main Topics  . Smoking status: Former Research scientist (life sciences)  . Smokeless tobacco: Never Used     Comment: "smoked cigarettes a thousand years ago; maybe a cigarette qod for a time"  . Alcohol Use: No  . Drug Use: No  . Sexual Activity: Not Currently   Other Topics Concern  . Not on file   Social History Narrative     Review of Systems General:  No chills, fever, night sweats or weight changes.  Cardiovascular:  See HPI Dermatological: No rash, lesions/masses Respiratory: See HPI Urologic: No hematuria, dysuria Abdominal:   No nausea, vomiting, diarrhea, bright red blood per rectum, melena, or hematemesis Neurologic:  No visual changes, wkns, changes in mental status. All other systems reviewed and are otherwise negative except as noted above.  Physical Exam  Blood pressure 109/64, pulse 55, temperature 97.6 F (36.4 C), temperature source Oral, resp. rate 13, height 5\' 6"  (1.676 m), weight 77.111 kg (170 lb), SpO2 98 %.  General: Pleasant, NAD Psych: Normal affect, anxious Neuro: Alert and oriented X 3. Moves all extremities spontaneously. HEENT: Normal  Neck: Supple without bruits or JVD. Lungs:  Resp regular and unlabored, CTA. Heart: RRR no s3, s4, or murmurs. Abdomen: Soft, non-tender, non-distended, BS + x 4.  Extremities: No clubbing, cyanosis or edema. DP/PT/Radials 2+ and equal bilaterally.  Labs  Troponin (Point of Care Test) No results for input(s): TROPIPOC in the last 72 hours.  Recent Labs  05/12/14 0020  TROPONINI <0.30   Lab Results  Component Value Date   WBC 9.0 05/12/2014   HGB 14.8 05/12/2014   HCT 42.2 05/12/2014   MCV 91.5 05/12/2014   PLT 250 05/12/2014    Recent Labs Lab  05/12/14 0020  NA 133*  K 3.6*  CL 96  CO2 21  BUN 23  CREATININE 1.25  CALCIUM 9.8  PROT 7.3  BILITOT 1.4*  ALKPHOS 68  ALT 24  AST 21  GLUCOSE 151*   Lab Results  Component Value Date   CHOL 173 05/09/2014   HDL 36* 05/09/2014   LDLCALC 93 05/09/2014   TRIG 222* 05/09/2014   Lab Results  Component Value Date   DDIMER 0.33 05/06/2014     Radiology/Studies  Dg Chest 2 View  05/12/2014   CLINICAL DATA:  Shortness of breath, chest pain  EXAM: CHEST  2 VIEW  COMPARISON:  05/06/2014  FINDINGS: The heart size and mediastinal contours are  within normal limits. Both lungs are clear. The visualized skeletal structures are unremarkable.  IMPRESSION: No active cardiopulmonary disease.   Electronically Signed   By: Kathreen Devoid   On: 05/12/2014 01:36   Dg Chest 2 View  05/06/2014   CLINICAL DATA:  Burning of right chest pain.  EXAM: CHEST  2 VIEW  COMPARISON:  PA and lateral chest 10/03/2006.  FINDINGS: Lung volumes are low but the lungs are clear with a punctate calcified granuloma in the right middle lobe unchanged. Heart size is normal. No pneumothorax or pleural effusion.  IMPRESSION: No acute finding in a low volume chest.   Electronically Signed   By: Inge Rise M.D.   On: 05/06/2014 03:54   Ct Head Wo Contrast  05/08/2014   CLINICAL DATA:  Altered mental status. The patient had chest pain yesterday resulting in a cardiac catheterization last night. Hypertension.  EXAM: CT HEAD WITHOUT CONTRAST  TECHNIQUE: Contiguous axial images were obtained from the base of the skull through the vertex without intravenous contrast.  COMPARISON:  None.  FINDINGS: Diffusely high vascular density observed. The patient was not polycythemic on yesterday's CBC.  The brainstem, cerebellum, cerebral peduncles, thalamus, basal ganglia, basilar cisterns, and ventricular system appear within normal limits. Periventricular white matter and corona radiata hypodensities favor chronic ischemic  microvascular white matter disease. No intracranial hemorrhage, mass lesion, or acute CVA.  Polypoid mucoperiosteal thickening in the left maxillary sinus. Polypoid mucoperiosteal thickening in both maxillary sinuses. Mucosal swelling and possible polyps in the nasal cavity. Chronic ethmoid and left sphenoid sinusitis.  IMPRESSION: 1. Periventricular white matter and corona radiata hypodensities favor chronic ischemic microvascular white matter disease. 2. Diffusely hypervascular density not attributable to polycythemia. Although this may be leftover contrast from yesterday's catheterization, careful correlation with the already requested basic metabolic profile is recommended to ensure that the creatinine is within normal limits and that the contrast is being cleared, in order to rule out contrast nephropathy as a cause for residual intravascular contrast 14 hours out from the cardiac catheterization. 3. Chronic paranasal sinusitis. Mucosal swelling in the nasal cavity.   Electronically Signed   By: Sherryl Barters M.D.   On: 05/08/2014 08:13   Cardiac Cath 05/07/14 PROCEDURAL FINDINGS Hemodynamics: AO 168/72 mean 109 mm Hg LV 170/8 mm Hg  Coronary angiography: Coronary dominance: right  Left mainstem: Normal  Left anterior descending (LAD): The LAD is a large vessel with complex 90% stenosis in the proximal vessel. There is diffuse disease in the mid vessel up to 20-30%.   There is a large ramus intermediate branch that is normal.  Left circumflex (LCx): The LCx is a large vessel with 30% mid vessel disease. There appears to be a stent in the distal vessel that is widely patent.   Right coronary artery (RCA): The RCA has a 90% stenosis in the mid vessel.   Left ventriculography: Not done.  PCI Note: Following the diagnostic procedure, the decision was made to proceed with PCI of the LAD. Weight-based bivalirudin was given for anticoagulation. Effient 60 mg was given orally.  Once a therapeutic ACT was achieved, a 6 [place] XBLAD 3.5 guide catheter was inserted. A choice PT moderate support coronary guidewire was used to cross the lesion. The lesion was predilated with a 2.5 mm balloon. The lesion was then stented with a 3.0 x 18 mm Biotonik Orsiro stent. The stent was postdilated with a 3.5 mm noncompliant balloon. Following PCI, there was 0% residual stenosis and TIMI-3 flow.  Final angiography confirmed an excellent result.   We next addressed the RCA lesion. The lesion was crossed with a Choice PT moderate support wire. The lesion was pre dilated with a 2.25 mm balloon. The lesion was then stented with a 2.5 x 18 mm Biotronik Orsiro stent. The lesion was post dilated with a 2.75 mm noncompliant balloon. Following PCI, there was 0% residual stenosis and TIMI-3 flow. Final angiography confirmed an excellent result. The patient tolerated the procedure well. There were no immediate procedural complications. A TR band was used for radial hemostasis. The patient was transferred to the post catheterization recovery area for further monitoring.  PCI Data: Vessel - LAD/Segment - proximal Percent Stenosis (pre) 90% TIMI-flow 3 Stent 3.0 x 18 mm Biotronik Osiro DES Percent Stenosis (post) 0% TIMI-flow (post) 3  Vessel #2: RCA/mid Percent stenosis (pre) 90% TIMI flow (pre) 3 Stent: 2.5 x 18 mm Biotronik Osiro DES Percent stenosis (post) 0% TIMI flow (post) 3  Final Conclusions:  1. Severe 2 vessel obstructive CAD 2. Patent stent in the distal LCx. 3. Successful stenting of the proximal LAD with DES 4. Successful stenting of the mid RCA with DES.    ECG  05/11/14 NSR, 1st degree AVB. NSSTTWC.  05/07/14 SB, 1st degree AVB.  ASSESSMENT AND PLAN  1M with DM, HTN, HLD, CAD s/p PCI to RCA and LAD (05/07/14) who presents with dyspnea and chest pain. The chest pain appears to be related to dyspnea and is unlikely to be related to an ACS event. ECG is essentially  unchanged.The patient and his wife are very anxious about these symptoms. I suspect that the symptoms are probably related to a side effect from ticagrelor and it is worth switching to an alternative agent.  Now that it appears unlikely that the AMS was due to a stroke, it would be reasonable to consider either prasugrel or clopidogrel. Anxiety and possibly central sleep apnea may be exacerbating what is likely a ticagrelor side effect. Given apneic spells and reported severity of symptoms, it is reasonable to admit for observation (and oxygen for comfort) while transitioning from ticagrelor to another agent. I will defer to Dr. Martinique regarding his agent of choice.   Signed, Lamar Sprinkles, MD 05/12/2014, 2:08 AM

## 2014-05-13 ENCOUNTER — Encounter (HOSPITAL_COMMUNITY): Payer: Self-pay | Admitting: Cardiology

## 2014-05-13 DIAGNOSIS — I10 Essential (primary) hypertension: Secondary | ICD-10-CM

## 2014-05-13 DIAGNOSIS — R0602 Shortness of breath: Secondary | ICD-10-CM | POA: Diagnosis not present

## 2014-05-13 LAB — GLUCOSE, CAPILLARY
GLUCOSE-CAPILLARY: 137 mg/dL — AB (ref 70–99)
Glucose-Capillary: 155 mg/dL — ABNORMAL HIGH (ref 70–99)

## 2014-05-13 MED ORDER — CLOPIDOGREL BISULFATE 75 MG PO TABS: 75.0000 mg | ORAL_TABLET | Freq: Every day | ORAL | Status: DC

## 2014-05-13 MED ORDER — CLOPIDOGREL BISULFATE 300 MG PO TABS
600.0000 mg | ORAL_TABLET | Freq: Once | ORAL | Status: AC
  Administered 2014-05-13: 600 mg via ORAL
  Filled 2014-05-13: qty 2

## 2014-05-13 NOTE — Plan of Care (Signed)
Problem: Phase I Progression Outcomes Goal: Pain controlled with appropriate interventions Outcome: Completed/Met Date Met:  05/13/14 Goal: OOB as tolerated unless otherwise ordered Outcome: Completed/Met Date Met:  05/13/14 Goal: Voiding-avoid urinary catheter unless indicated Outcome: Completed/Met Date Met:  05/13/14

## 2014-05-13 NOTE — Progress Notes (Signed)
Pt discharged home Discharge instructions given & reviewed Eduction discussed  IV dc'd  Tele dc'd  Pt request to ambulate out at discharge.  All pt belongs at side.  Kathleen Argue S 1:08 PM

## 2014-05-13 NOTE — Progress Notes (Signed)
SUBJECTIVE:  Feels well this morning.  Wants to go home.  Feels better after Brilinta was stopped.  No new antiplatelet medicine was started.   OBJECTIVE:   Vitals:   Filed Vitals:   05/12/14 1010 05/12/14 1447 05/12/14 2104 05/13/14 0452  BP: 115/54 115/67 130/62 106/59  Pulse: 58 65 62 63  Temp:  98.4 F (36.9 C) 98.9 F (37.2 C) 98.6 F (37 C)  TempSrc:  Oral Oral Oral  Resp:  18 16 12   Height:      Weight:      SpO2:  98% 96% 96%   I&O's:   Intake/Output Summary (Last 24 hours) at 05/13/14 0850 Last data filed at 05/12/14 1700  Gross per 24 hour  Intake    960 ml  Output      0 ml  Net    960 ml   TELEMETRY: Reviewed telemetry pt in NSR, PACs:     PHYSICAL EXAM General: Well developed, well nourished, in no acute distress Head:   Normal cephalic and atramatic  Lungs: Clear bilaterally to auscultation. Heart:   HRRR S1 S2  No JVD.   Abdomen: abdomen soft and non-tender Msk:  Back normal,  Normal strength and tone for age. Extremities:   No edema.   Neuro: Alert and oriented. Psych:  Normal affect, responds appropriately Skin: No rash   LABS: Basic Metabolic Panel:  Recent Labs  05/12/14 0020  NA 133*  K 3.6*  CL 96  CO2 21  GLUCOSE 151*  BUN 23  CREATININE 1.25  CALCIUM 9.8   Liver Function Tests:  Recent Labs  05/12/14 0020  AST 21  ALT 24  ALKPHOS 68  BILITOT 1.4*  PROT 7.3  ALBUMIN 3.8   No results for input(s): LIPASE, AMYLASE in the last 72 hours. CBC:  Recent Labs  05/12/14 0020  WBC 9.0  NEUTROABS 5.7  HGB 14.8  HCT 42.2  MCV 91.5  PLT 250   Cardiac Enzymes:  Recent Labs  05/12/14 0020  TROPONINI <0.30   BNP: Invalid input(s): POCBNP D-Dimer: No results for input(s): DDIMER in the last 72 hours. Hemoglobin A1C: No results for input(s): HGBA1C in the last 72 hours. Fasting Lipid Panel: No results for input(s): CHOL, HDL, LDLCALC, TRIG, CHOLHDL, LDLDIRECT in the last 72 hours. Thyroid Function Tests: No  results for input(s): TSH, T4TOTAL, T3FREE, THYROIDAB in the last 72 hours.  Invalid input(s): FREET3 Anemia Panel: No results for input(s): VITAMINB12, FOLATE, FERRITIN, TIBC, IRON, RETICCTPCT in the last 72 hours. Coag Panel:   Lab Results  Component Value Date   INR 0.90 05/06/2014    RADIOLOGY: Dg Chest 2 View  05/12/2014   CLINICAL DATA:  Shortness of breath, chest pain  EXAM: CHEST  2 VIEW  COMPARISON:  05/06/2014  FINDINGS: The heart size and mediastinal contours are within normal limits. Both lungs are clear. The visualized skeletal structures are unremarkable.  IMPRESSION: No active cardiopulmonary disease.   Electronically Signed   By: Kathreen Devoid   On: 05/12/2014 01:36   Dg Chest 2 View  05/06/2014   CLINICAL DATA:  Burning of right chest pain.  EXAM: CHEST  2 VIEW  COMPARISON:  PA and lateral chest 10/03/2006.  FINDINGS: Lung volumes are low but the lungs are clear with a punctate calcified granuloma in the right middle lobe unchanged. Heart size is normal. No pneumothorax or pleural effusion.  IMPRESSION: No acute finding in a low volume chest.   Electronically Signed  By: Inge Rise M.D.   On: 05/06/2014 03:54   Ct Head Wo Contrast  05/08/2014   CLINICAL DATA:  Altered mental status. The patient had chest pain yesterday resulting in a cardiac catheterization last night. Hypertension.  EXAM: CT HEAD WITHOUT CONTRAST  TECHNIQUE: Contiguous axial images were obtained from the base of the skull through the vertex without intravenous contrast.  COMPARISON:  None.  FINDINGS: Diffusely high vascular density observed. The patient was not polycythemic on yesterday's CBC.  The brainstem, cerebellum, cerebral peduncles, thalamus, basal ganglia, basilar cisterns, and ventricular system appear within normal limits. Periventricular white matter and corona radiata hypodensities favor chronic ischemic microvascular white matter disease. No intracranial hemorrhage, mass lesion, or acute CVA.   Polypoid mucoperiosteal thickening in the left maxillary sinus. Polypoid mucoperiosteal thickening in both maxillary sinuses. Mucosal swelling and possible polyps in the nasal cavity. Chronic ethmoid and left sphenoid sinusitis.  IMPRESSION: 1. Periventricular white matter and corona radiata hypodensities favor chronic ischemic microvascular white matter disease. 2. Diffusely hypervascular density not attributable to polycythemia. Although this may be leftover contrast from yesterday's catheterization, careful correlation with the already requested basic metabolic profile is recommended to ensure that the creatinine is within normal limits and that the contrast is being cleared, in order to rule out contrast nephropathy as a cause for residual intravascular contrast 14 hours out from the cardiac catheterization. 3. Chronic paranasal sinusitis. Mucosal swelling in the nasal cavity.   Electronically Signed   By: Sherryl Barters M.D.   On: 05/08/2014 08:13      ASSESSMENT: Kathyrn Schmidt:   CAD: 2 DES placed on 05/07/14.  SHOB with Brilinta.  Ordered Plavix 600 mg x 1 now.  D/w nurse that he should get this medicine ASAP.  Will observe for a few hours after he gets the dose.  Possible discharge later today with aggressive secondary prevention.    ? Whether he needs a sleep study.  Will defer to the MDs taking care of him as an outpatient.   BP has been well controlled. Continue current antiHTN meds.    [name]., MD  05/13/2014  8:50 AM

## 2014-05-13 NOTE — Discharge Summary (Signed)
Physician Discharge Summary       Patient ID: Jeffery Schmidt MRN: [id number] DOB/AGE: 1940/07/15 73 y.o.  Admit date: 05/12/2014 Discharge date: 05/13/2014  Discharge Diagnoses:  Principal Problem:   Shortness of breath, secondary to Brilinta Active Problems:   CAD (coronary artery disease)   HTN (hypertension)   Type 2 diabetes mellitus with other specified complication   Discharged Condition: good  Procedures: none  Hospital Course: 48M with DM, HTN, HLD, CAD s/p PCI to RCA and LAD (05/07/14) who presents with dyspnea and chest pain.    Jeffery Schmidt was admitted from 05/06/14-05/09/14 for unstable angina and ultimately underwent PCI of lesions in the pLAD and mRCA. He was initially started on prasugrel but this was switched to ticagrelor in the setting of AMS that was initially thought possibly related to a CVA; AMS was ultimately attributed to an adverse reaction to the sedatives used during the cardiac cath. Head CT was unrevealing. New diagnosis of DM made. Although I cannot find good documentation of this, Jeffery Schmidt states that after starting ticagrelor, he had dyspnea and apneic spells at night which symptomatically improved with oxygen. He states the nurses told him to take caffeine to reverse the side effects of ticagrelor. Someone also apparently told him he needs a sleep study as an outpatient. Jeffery Schmidt states that since discharge, he would frequently wake up short of breath with chest tightness and his wife states he would "stop breathing," similar to the apneic episodes in the hospital. Wife denies there being any snoring. States SOB is typically not present during the day, which he attributes to caffeine use. He states that he does notice some SOB in the AM after taking a dose of ticagrelor. He denies exertional dyspnea or chest pain or other symptoms out of the ordinary. Due to recurrent difficulty sleeping due to waking up SOB, he presented to the ED.   On arrival to  the ED, Jeffery Schmidt was hemodynamically stable. 132/69, 63, 98% on RA. Labs were notable for K 3.6, Cr 1.25, TnI 0.30. CXR was without active cardiopulmonary disease. ECG demonstrated NSR, 1st degree AVB, and NSSTTWC.  His Brilinta was stopped.    His troponin was negative.  Without the Brilinta he is not SOB.  He was seen by Dr. Irish Lack and placed on Plavix.  He tolerated this without complications and was found stable for discharge.  His CXR was stable and Pro BNP 161.8.  Consults: None  Significant Diagnostic Studies:  BMET    Component Value Date/Time   NA 133* 05/12/2014 0020   K 3.6* 05/12/2014 0020   CL 96 05/12/2014 0020   CO2 21 05/12/2014 0020   GLUCOSE 151* 05/12/2014 0020   BUN 23 05/12/2014 0020   CREATININE 1.25 05/12/2014 0020   CALCIUM 9.8 05/12/2014 0020   GFRNONAA 55* 05/12/2014 0020   GFRAA 64* 05/12/2014 0020    CBC    Component Value Date/Time   WBC 9.0 05/12/2014 0020   RBC 4.61 05/12/2014 0020   HGB 14.8 05/12/2014 0020   HCT 42.2 05/12/2014 0020   PLT 250 05/12/2014 0020   MCV 91.5 05/12/2014 0020   MCH 32.1 05/12/2014 0020   MCHC 35.1 05/12/2014 0020   RDW 12.8 05/12/2014 0020   LYMPHSABS 2.4 05/12/2014 0020   MONOABS 0.6 05/12/2014 0020   EOSABS 0.2 05/12/2014 0020   BASOSABS 0.1 05/12/2014 0020    Troponin <0.30 Pro BNP 161.8   CHEST 2 VIEW COMPARISON: 05/06/2014 FINDINGS: The heart  size and mediastinal contours are within normal limits. Both lungs are clear. The visualized skeletal structures are unremarkable. IMPRESSION: No active cardiopulmonary disease.  Discharge Exam: Blood pressure 118/60, pulse 63, temperature 98.6 F (37 C), temperature source Oral, resp. rate 12, height 5\' 6"  (1.676 m), weight 172 lb 8 oz (78.245 kg), SpO2 96 %.    Disposition: 01-Home or Self Care     Medication List    STOP taking these medications        ticagrelor 90 MG Tabs tablet  Commonly known as:  BRILINTA      TAKE these  medications        amLODipine 5 MG tablet  Commonly known as:  NORVASC  Take 5 mg by mouth daily.     aspirin 81 MG tablet  Take 81 mg by mouth daily.     atenolol 100 MG tablet  Commonly known as:  TENORMIN  Take 100 mg by mouth daily.     clopidogrel 75 MG tablet  Commonly known as:  PLAVIX  Take 1 tablet (75 mg total) by mouth daily.  Start taking on:  05/14/2014     ezetimibe 10 MG tablet  Commonly known as:  ZETIA  Take 10 mg by mouth daily.     GLUCOSAMINE CHONDROITIN JOINT PO  Take 1 tablet by mouth daily.     losartan-hydrochlorothiazide 100-25 MG per tablet  Commonly known as:  HYZAAR  Take 1 tablet by mouth daily.     metFORMIN 500 MG tablet  Commonly known as:  GLUCOPHAGE  Take 1 tablet (500 mg total) by mouth 2 (two) times daily with a meal.     omega-3 acid ethyl esters 1 G capsule  Commonly known as:  LOVAZA  Take 1 g by mouth daily.     pravastatin 20 MG tablet  Commonly known as:  PRAVACHOL  Take 10 mg by mouth daily.     VITAMIN C PO  Take 1 tablet by mouth daily.       Follow-up Information    Follow up with Truitt Merle, NP On 05/22/2014.   Specialty:  Nurse Practitioner   Why:  at 10:00 AM   Contact information:   [place] [place] [address] [phone]        Discharge Instructions:  Keep follow up appt with Truitt Merle, NP as previously planned Call if any problems. Stop Brilinta Take Plavix 75 mg daily instead with Asprin.  Signed: Isaiah Serge Nurse Practitioner-Certified [facility]: [facility] 05/13/2014, 11:10 AM  Time spent on discharge :  > 35 minutes.     I have examined the patient and reviewed assessment and plan and discussed with patient.  Agree with above as stated.  Stressed importance of DAPT.  Use clopidogrel instead of Brilinta due to [facility] side effect.  Loaded earlier today.  Daily dose to start tomorrow.  Kadeen Sroka S.

## 2014-05-14 ENCOUNTER — Encounter: Payer: Medicare Other | Attending: Internal Medicine

## 2014-05-14 VITALS — Ht 66.0 in | Wt 178.5 lb

## 2014-05-14 DIAGNOSIS — E119 Type 2 diabetes mellitus without complications: Secondary | ICD-10-CM | POA: Insufficient documentation

## 2014-05-14 DIAGNOSIS — Z713 Dietary counseling and surveillance: Secondary | ICD-10-CM | POA: Insufficient documentation

## 2014-05-15 NOTE — Progress Notes (Signed)

## 2014-05-21 DIAGNOSIS — E119 Type 2 diabetes mellitus without complications: Secondary | ICD-10-CM | POA: Diagnosis not present

## 2014-05-21 DIAGNOSIS — E1169 Type 2 diabetes mellitus with other specified complication: Secondary | ICD-10-CM

## 2014-05-21 NOTE — Progress Notes (Signed)

## 2014-05-22 ENCOUNTER — Ambulatory Visit (INDEPENDENT_AMBULATORY_CARE_PROVIDER_SITE_OTHER): Payer: Medicare Other | Admitting: Nurse Practitioner

## 2014-05-22 ENCOUNTER — Encounter: Payer: Self-pay | Admitting: Nurse Practitioner

## 2014-05-22 VITALS — BP 138/62 | HR 57 | Ht 66.0 in | Wt 174.0 lb

## 2014-05-22 DIAGNOSIS — Z79899 Other long term (current) drug therapy: Secondary | ICD-10-CM

## 2014-05-22 DIAGNOSIS — Z955 Presence of coronary angioplasty implant and graft: Secondary | ICD-10-CM

## 2014-05-22 LAB — CBC
HCT: 39.6 % (ref 39.0–52.0)
Hemoglobin: 13.4 g/dL (ref 13.0–17.0)
MCHC: 33.9 g/dL (ref 30.0–36.0)
MCV: 93.4 fl (ref 78.0–100.0)
Platelets: 236 10*3/uL (ref 150.0–400.0)
RBC: 4.24 Mil/uL (ref 4.22–5.81)
RDW: 12.9 % (ref 11.5–15.5)
WBC: 5.4 10*3/uL (ref 4.0–10.5)

## 2014-05-22 LAB — BASIC METABOLIC PANEL
BUN: 24 mg/dL — ABNORMAL HIGH (ref 6–23)
CO2: 27 mEq/L (ref 19–32)
Calcium: 9.1 mg/dL (ref 8.4–10.5)
Chloride: 100 mEq/L (ref 96–112)
Creatinine, Ser: 1.4 mg/dL (ref 0.4–1.5)
GFR: 54.87 mL/min — ABNORMAL LOW (ref >60.00)
Glucose, Bld: 105 mg/dL — ABNORMAL HIGH (ref 70–99)
Potassium: 3.4 mEq/L — ABNORMAL LOW (ref 3.5–5.1)
Sodium: 137 mEq/L (ref 135–145)

## 2014-05-22 NOTE — Patient Instructions (Addendum)
We will be checking the following labs today CBC and BMET  Ok to start walking - start 10 minutes twice a day and add a minute a day - goal is 45 to 60 minutes a day  Stay on all your medicines  See Dr. Martinique in February as planned.  Call the [place] office at [phone] if you have any questions, problems or concerns.

## 2014-05-22 NOTE — Progress Notes (Signed)
Jeffery Schmidt Date of Birth: [birth year] Medical Record #[id number]  History of Present Illness: Jeffery Schmidt is seen back today for a post hospital visit. Seen for Dr. Martinique. He is a 73 year old male with DM, HTN, HLD, and CAD s/p PCI to RCA and LAD (05/07/14). He has had remote PCI back in 1998.   Jeffery Schmidt was admitted from 05/06/14-05/09/14 for unstable angina and ultimately underwent PCI of lesions in the pLAD and mRCA. He was initially started on prasugrel but this was switched to ticagrelor in the setting of AMS that was initially thought possibly related to a CVA; AMS was ultimately attributed to an adverse reaction to the sedatives used during the cardiac cath. Head CT was unrevealing. New diagnosis of DM made although good documentation could not be found. He developed dyspnea and apneic spells after starting ticagrelor.  Due to recurrent difficulty sleeping due to waking up SOB, he presented back to the ED - subsequently seen by Cardiology and switched over to Plavix. His labs and CXR were stable.   Comes in today. Here alone. Doing better. No chest pain. Breathing has improved. Now on diabetic medicine. Still with concern about OSA. [place] - cardiac rehab really not an option for him. Tolerating his medicines. Has seen Dr. Nyoka Cowden - now on metformin. Sleep study possible by PCP.   Current Outpatient Prescriptions  Medication Sig Dispense Refill  . amLODipine (NORVASC) 5 MG tablet Take 5 mg by mouth daily.    . Ascorbic Acid (VITAMIN C PO) Take 1 tablet by mouth daily.    [name] aspirin 81 MG tablet Take 81 mg by mouth daily.    [name] atenolol (TENORMIN) 100 MG tablet Take 100 mg by mouth daily.    . clopidogrel (PLAVIX) 75 MG tablet Take 1 tablet (75 mg total) by mouth daily. 30 tablet 11  . ezetimibe (ZETIA) 10 MG tablet Take 10 mg by mouth daily.    . Glucos-Chondroit-Hyaluron-MSM (GLUCOSAMINE CHONDROITIN JOINT PO) Take 1 tablet by  mouth daily.    [name] losartan-hydrochlorothiazide (HYZAAR) 100-25 MG per tablet Take 1 tablet by mouth daily.    . metFORMIN (GLUCOPHAGE) 500 MG tablet Take 1 tablet (500 mg total) by mouth 2 (two) times daily with a meal. (Patient taking differently: Take 500 mg by mouth daily with breakfast. ) 60 tablet 0  . omega-3 acid ethyl esters (LOVAZA) 1 G capsule Take 1 g by mouth daily.    . pravastatin (PRAVACHOL) 20 MG tablet Take 10 mg by mouth daily.     No current facility-administered medications for this visit.    Allergies  Allergen Reactions  . Epinephrine     unknown    Past Medical History  Diagnosis Date  . HTN (hypertension)   . HLD (hyperlipidemia)   . Kidney stones   . CAD (coronary artery disease)     a. 1998 s/p PCI;  b. 12/2012 MV: EF 68%, no ischemia.  . Diabetes mellitus without complication     Past Surgical History  Procedure Laterality Date  . Lithotripsy  1990's  . Nephrostomy  1990's  . Coronary angioplasty  1998  . Blepharoplasty Bilateral 2000's  . Coronary angioplasty with stent placement  05/07/14    stents to LAD and RCA, patent LCX stent     History  Smoking status  . Former Smoker  Smokeless tobacco  . Never Used    Comment: "smoked  cigarettes a thousand years ago; maybe a cigarette qod for a time"    History  Alcohol Use No    Family History  Problem Relation Age of Onset  . Coronary artery disease    . Heart disease    . Hypertension    . Stroke    . Hypertension Mother     Review of Systems: The review of systems is per the HPI.  All other systems were reviewed and are negative.  Physical Exam: BP 138/62 mmHg  Pulse 57  Ht 5\' 6"  (1.676 m)  Wt 174 lb (78.926 kg)  BMI 28.10 kg/m2  SpO2 97% Patient is very pleasant and in no acute distress. Skin is warm and dry. Color is normal.  HEENT is unremarkable. Normocephalic/atraumatic. PERRL. Sclera are nonicteric. Neck is supple. No masses. No JVD. Lungs are clear. Cardiac exam shows a  regular rate and rhythm. Abdomen is soft. Extremities are without edema. Gait and ROM are intact. No gross neurologic deficits noted.  Wt Readings from Last 3 Encounters:  05/22/14 174 lb (78.926 kg)  05/15/14 178 lb 8 oz (80.967 kg)  05/12/14 172 lb 8 oz (78.245 kg)    LABORATORY DATA/PROCEDURES:  Lab Results  Component Value Date   WBC 9.0 05/12/2014   HGB 14.8 05/12/2014   HCT 42.2 05/12/2014   PLT 250 05/12/2014   GLUCOSE 151* 05/12/2014   CHOL 173 05/09/2014   TRIG 222* 05/09/2014   HDL 36* 05/09/2014   LDLCALC 93 05/09/2014   ALT 24 05/12/2014   AST 21 05/12/2014   NA 133* 05/12/2014   K 3.6* 05/12/2014   CL 96 05/12/2014   CREATININE 1.25 05/12/2014   BUN 23 05/12/2014   CO2 21 05/12/2014   INR 0.90 05/06/2014   HGBA1C 7.2* 05/06/2014    BNP (last 3 results)  Recent Labs  05/12/14 0539  PROBNP 161.8*   Cardiac Catheterization Procedure Note  Name: Jeffery Schmidt MRN: [id number] DOB: March 18, 1941  Procedure: Left Heart Cath, Selective Coronary Angiography, PTCA and stenting of the proximal LAD and mid RCA.  Indication: 73 yo WM with history of remote PCI presents with unstable angina.   Procedural Details: The right wrist was prepped, draped, and anesthetized with 1% lidocaine. Using the modified Seldinger technique, a 6 French slender sheath was introduced into the right radial artery. 3 mg of verapamil was administered through the sheath, weight-based unfractionated heparin was administered intravenously. Standard Judkins catheters were used for selective coronary angiography and left ventriculography. A Noto catheter was used to engage the RCA. Catheter exchanges were performed over an exchange length guidewire.  PROCEDURAL FINDINGS Hemodynamics: AO 168/72 mean 109 mm Hg LV 170/8 mm Hg  Coronary angiography: Coronary dominance: right  Left mainstem: Normal  Left anterior descending (LAD): The LAD is a large vessel with complex 90%  stenosis in the proximal vessel. There is diffuse disease in the mid vessel up to 20-30%.   There is a large ramus intermediate branch that is normal.  Left circumflex (LCx): The LCx is a large vessel with 30% mid vessel disease. There appears to be a stent in the distal vessel that is widely patent.   Right coronary artery (RCA): The RCA has a 90% stenosis in the mid vessel.   Left ventriculography: Not done.  PCI Note: Following the diagnostic procedure, the decision was made to proceed with PCI of the LAD. Weight-based bivalirudin was given for anticoagulation. Effient 60 mg was given orally. Once a therapeutic ACT  was achieved, a 6 [place] XBLAD 3.5 guide catheter was inserted. A choice PT moderate support coronary guidewire was used to cross the lesion. The lesion was predilated with a 2.5 mm balloon. The lesion was then stented with a 3.0 x 18 mm Biotonik Orsiro stent. The stent was postdilated with a 3.5 mm noncompliant balloon. Following PCI, there was 0% residual stenosis and TIMI-3 flow. Final angiography confirmed an excellent result.   We next addressed the RCA lesion. The lesion was crossed with a Choice PT moderate support wire. The lesion was pre dilated with a 2.25 mm balloon. The lesion was then stented with a 2.5 x 18 mm Biotronik Orsiro stent. The lesion was post dilated with a 2.75 mm noncompliant balloon. Following PCI, there was 0% residual stenosis and TIMI-3 flow. Final angiography confirmed an excellent result. The patient tolerated the procedure well. There were no immediate procedural complications. A TR band was used for radial hemostasis. The patient was transferred to the post catheterization recovery area for further monitoring.  PCI Data: Vessel - LAD/Segment - proximal Percent Stenosis (pre) 90% TIMI-flow 3 Stent 3.0 x 18 mm Biotronik Osiro DES Percent Stenosis (post) 0% TIMI-flow (post) 3  Vessel #2: RCA/mid Percent stenosis (pre) 90% TIMI flow  (pre) 3 Stent: 2.5 x 18 mm Biotronik Osiro DES Percent stenosis (post) 0% TIMI flow (post) 3  Final Conclusions:  1. Severe 2 vessel obstructive CAD 2. Patent stent in the distal LCx. 3. Successful stenting of the proximal LAD with DES 4. Successful stenting of the mid RCA with DES.   Recommendations:  DAPT for one year. Risk factor modification. Anticipate DC in am.   Peter Martinique, [place] 05/07/2014, 6:06 PM   Echo Study Conclusions from November 2015  - Left ventricle: The cavity size was normal. There was mild focal basal and mild concentric hypertrophy of the septum. Systolic function was normal. The estimated ejection fraction was in the range of 60% to 65%. Wall motion was normal; there were no regional wall motion abnormalities. Doppler parameters are consistent with abnormal left ventricular relaxation (grade 1 diastolic dysfunction). Doppler parameters are consistent with high ventricular filling pressure. - Mitral valve: There was mild regurgitation. - Left atrium: The atrium was mildly dilated.  Assessment / Plan: 1. Post PCI - doing well clinically. Continue with current medicines and CV risk factor modification. Recheck BMET and CBC today. Discussed exercise requirements.   2. Adverse reaction with Brilinta -  Resolved.  3. DM -  Newly diagnosed.  4. HTN -  BP looks good on current regimen. His cuff correlates fairly well.  5. HLD -  On statin therapy  6. ?OSA - sleep study to be discussed with his PCP.   We will see back as planned. Lab today.   Patient is agreeable to this plan and will call if any problems develop in the interim.   Burtis Junes, RN, [facility] [address] [place], [place]  [address] [phone]

## 2014-05-27 ENCOUNTER — Telehealth: Payer: Self-pay | Admitting: Cardiology

## 2014-05-27 ENCOUNTER — Other Ambulatory Visit: Payer: Self-pay | Admitting: *Deleted

## 2014-05-27 DIAGNOSIS — E876 Hypokalemia: Secondary | ICD-10-CM

## 2014-05-27 NOTE — Telephone Encounter (Signed)
Follow up      Pt states Lori's nurse called him back and told him his potassium was low.  Andee Poles has left for the day.  Pt want someone to call him back today to give him the potassium number.  He says it is very importatant that someone calls him back today.

## 2014-05-27 NOTE — Telephone Encounter (Signed)
F/U  Patient wants to speak with someone about his potassium being low, also states he is having tingling in his left side in hands. Patient states he was speaking with someone earlier but doesn't fully understand convo because he was driving. Please contact at [phone].

## 2014-05-27 NOTE — Telephone Encounter (Signed)
Reviewed lab with him and gave his potassium number.  He will see dietician in AM since he is diabetic will be hard to increase K+ foods.

## 2014-05-28 ENCOUNTER — Encounter: Payer: Medicare Other | Attending: Internal Medicine

## 2014-05-28 DIAGNOSIS — E119 Type 2 diabetes mellitus without complications: Secondary | ICD-10-CM | POA: Diagnosis present

## 2014-05-28 DIAGNOSIS — Z713 Dietary counseling and surveillance: Secondary | ICD-10-CM | POA: Insufficient documentation

## 2014-05-28 NOTE — Progress Notes (Signed)
Patient was seen on 05/28/14 for the third of a series of three diabetes self-management courses at the [facility]. The following learning objectives were met by the patient during this class:  . State the amount of activity recommended for healthy living . Describe activities suitable for individual needs . Identify ways to regularly incorporate activity into daily life . Identify barriers to activity and ways to over come these barriers  Identify diabetes medications being personally used and their primary action for lowering glucose and possible side effects . Describe role of stress on blood glucose and develop strategies to address psychosocial issues . Identify diabetes complications and ways to prevent them  Explain how to manage diabetes during illness . Evaluate success in meeting personal goal . Establish 2-3 goals that they will plan to diligently work on until they return for the  10-monthfollow-up visit  Goals:   I will count my carb choices at most meals and snacks  I will be active 45 minutes or more 5 times a week  I will take my diabetes medications as scheduled  I will test my glucose at least 1 times a day, 7 days a week  Your patient has identified these potential barriers to change:  None noted - denies stress  Your patient has identified their diabetes self-care support plan as  Family Support On-line Resources  Plan:  Attend Core 4 in 4 months

## 2014-05-30 ENCOUNTER — Ambulatory Visit: Payer: Medicare Other | Admitting: Physician Assistant

## 2014-06-03 ENCOUNTER — Other Ambulatory Visit: Payer: Medicare Other

## 2014-06-06 ENCOUNTER — Encounter (HOSPITAL_COMMUNITY): Payer: Self-pay | Admitting: Cardiology

## 2014-08-22 ENCOUNTER — Ambulatory Visit: Payer: Medicare Other | Admitting: Cardiology

## 2014-08-30 ENCOUNTER — Institutional Professional Consult (permissible substitution): Payer: Medicare Other | Admitting: Internal Medicine

## 2014-09-11 ENCOUNTER — Encounter: Payer: Self-pay | Admitting: Cardiology

## 2014-09-11 ENCOUNTER — Ambulatory Visit (INDEPENDENT_AMBULATORY_CARE_PROVIDER_SITE_OTHER): Payer: Medicare Other | Admitting: Cardiology

## 2014-09-11 VITALS — BP 142/74 | HR 52 | Ht 66.0 in | Wt 161.0 lb

## 2014-09-11 DIAGNOSIS — E1169 Type 2 diabetes mellitus with other specified complication: Secondary | ICD-10-CM

## 2014-09-11 DIAGNOSIS — I10 Essential (primary) hypertension: Secondary | ICD-10-CM | POA: Diagnosis not present

## 2014-09-11 DIAGNOSIS — I2510 Atherosclerotic heart disease of native coronary artery without angina pectoris: Secondary | ICD-10-CM | POA: Diagnosis not present

## 2014-09-11 NOTE — Patient Instructions (Signed)
Continue your current therapy  I will see you in one year   

## 2014-09-11 NOTE — Progress Notes (Signed)
[facility] [facility] Date of Birth: 06-Aug-1940 Medical Record #[id number]  History of Present Illness: Jeffery Schmidt is seen for follow up CAD. He is a 74 year old male with DM, HTN, HLD, and CAD s/p PCI to RCA and LAD (05/07/14). He has had remote PCI back in 1998.  On follow up today he is doing very well. He denies any chest pain or SOB. He has made significant dietary modifications and has lost 15 lbs. He reports his sugars are doing well and labs followed by Dr. Nyoka Cowden. He is active. He states he lives much of the time in [place] where he has a place on the [address].   Current Outpatient Prescriptions  Medication Sig Dispense Refill  . amLODipine (NORVASC) 5 MG tablet Take 5 mg by mouth daily.    . Ascorbic Acid (VITAMIN C PO) Take 1 tablet by mouth daily.    [name] aspirin 81 MG tablet Take 81 mg by mouth daily.    [name] atenolol (TENORMIN) 100 MG tablet Take 100 mg by mouth daily.    . clopidogrel (PLAVIX) 75 MG tablet Take 1 tablet (75 mg total) by mouth daily. 30 tablet 11  . ezetimibe (ZETIA) 10 MG tablet Take 10 mg by mouth daily.    . Glucos-Chondroit-Hyaluron-MSM (GLUCOSAMINE CHONDROITIN JOINT PO) Take 1 tablet by mouth daily.    [name] losartan-hydrochlorothiazide (HYZAAR) 100-25 MG per tablet Take 1 tablet by mouth daily.    . metFORMIN (GLUCOPHAGE) 500 MG tablet Take 500 mg daily    . omega-3 acid ethyl esters (LOVAZA) 1 G capsule Take 1 g by mouth daily.    . pravastatin (PRAVACHOL) 20 MG tablet Take 10 mg by mouth daily.     No current facility-administered medications for this visit.    Allergies  Allergen Reactions  . Epinephrine     unknown    Past Medical History  Diagnosis Date  . HTN (hypertension)   . HLD (hyperlipidemia)   . Kidney stones   . CAD (coronary artery disease)     a. 1998 s/p PCI;  b. 12/2012 MV: EF 68%, no ischemia.  . Diabetes mellitus without complication     Past Surgical History  Procedure Laterality Date  . Lithotripsy  1990's  . Nephrostomy  1990's    . Coronary angioplasty  1998  . Blepharoplasty Bilateral 2000's  . Coronary angioplasty with stent placement  05/07/14    stents to LAD and RCA, patent LCX stent   . Left heart catheterization with coronary angiogram N/A 05/07/2014    Procedure: LEFT HEART CATHETERIZATION WITH CORONARY ANGIOGRAM;  Surgeon: Camron Monday M Martinique, MD;  Location: [facility] CATH LAB;  Service: Cardiovascular;  Laterality: N/A;  . Percutaneous coronary stent intervention (pci-s)  05/07/2014    Procedure: PERCUTANEOUS CORONARY STENT INTERVENTION (PCI-S);  Surgeon: Laetitia Schnepf M Martinique, MD;  Location: [facility] CATH LAB;  Service: Cardiovascular;;  Drug eluting stent Prox LAD (3.0/18 Orsiro) and drug eluting stent to Mid RCA (2.5/18 Orsiro)    History  Smoking status  . Former Smoker  Smokeless tobacco  . Never Used    Comment: "smoked cigarettes a thousand years ago; maybe a cigarette qod for a time"    History  Alcohol Use No    Family History  Problem Relation Age of Onset  . Coronary artery disease    . Heart disease    . Hypertension    . Stroke    . Hypertension Mother     Review of Systems:  The review of systems is per the HPI.  All other systems were reviewed and are negative.  Physical Exam: BP 142/74 mmHg  Pulse 52  Ht 5\' 6"  (1.676 m)  Wt 161 lb (73.029 kg)  BMI 26.00 kg/m2 Patient is very pleasant and in no acute distress. Skin is warm and dry. Color is normal.  HEENT is unremarkable. Normocephalic/atraumatic. PERRL. Sclera are nonicteric. Neck is supple. No masses. No JVD. Lungs are clear. Cardiac exam shows a regular rate and rhythm. Abdomen is soft. Extremities are without edema. Gait and ROM are intact. No gross neurologic deficits noted.  Wt Readings from Last 3 Encounters:  09/11/14 161 lb (73.029 kg)  05/22/14 174 lb (78.926 kg)  05/15/14 178 lb 8 oz (80.967 kg)    LABORATORY DATA/PROCEDURES:  Lab Results  Component Value Date   WBC 5.4 05/22/2014   HGB 13.4 05/22/2014   HCT 39.6 05/22/2014    PLT 236.0 05/22/2014   GLUCOSE 105* 05/22/2014   CHOL 173 05/09/2014   TRIG 222* 05/09/2014   HDL 36* 05/09/2014   LDLCALC 93 05/09/2014   ALT 24 05/12/2014   AST 21 05/12/2014   NA 137 05/22/2014   K 3.4* 05/22/2014   CL 100 05/22/2014   CREATININE 1.4 05/22/2014   BUN 24* 05/22/2014   CO2 27 05/22/2014   INR 0.90 05/06/2014   HGBA1C 7.2* 05/06/2014    BNP (last 3 results)  Recent Labs  05/12/14 0539  PROBNP 161.8*   Assessment / Plan: 1. CAD s/p DES of the LAD and RCA in November 2015- doing well clinically. Continue with current medicines and CV risk factor modification. Continue DAPT for at least one year.   2. History of intolerance of Brilinta  3. DM -  Newly diagnosed. Improved control with dietary modification/metformin.  4. HTN -  BP looks good on current regimen.   5. HLD -  On statin therapy

## 2014-09-12 ENCOUNTER — Ambulatory Visit (INDEPENDENT_AMBULATORY_CARE_PROVIDER_SITE_OTHER): Payer: Medicare Other | Admitting: Internal Medicine

## 2014-09-12 ENCOUNTER — Encounter: Payer: Self-pay | Admitting: Internal Medicine

## 2014-09-12 VITALS — BP 148/78 | HR 56 | Ht 66.0 in | Wt 161.0 lb

## 2014-09-12 DIAGNOSIS — G4733 Obstructive sleep apnea (adult) (pediatric): Secondary | ICD-10-CM | POA: Insufficient documentation

## 2014-09-12 DIAGNOSIS — I2510 Atherosclerotic heart disease of native coronary artery without angina pectoris: Secondary | ICD-10-CM

## 2014-09-12 NOTE — Patient Instructions (Addendum)
Order- Schedule split protocol NPSG    Dx OSA  Please call as needed

## 2014-09-12 NOTE — Progress Notes (Signed)
09/12/14- 30 yoM never smoker referred courtesy of Dr Levin Erp for sleep evaluation; noticed in [facility] (for surgery) that he stopped breathing. He is aware that he snores. Not noticing daytime sleepiness, no coffee. Bedtime 9-12MN, 1 minute sleep latency, up 2-3 times before getting up 6:30-7:30AM. Weight up 20 lbs in past 2 years, and recently dx'd DM. Known HBP. Cardiac stents . Some seasonal rhinitis. Half-brother has CPAP.  Prior to Admission medications   Medication Sig Start Date End Date Taking? Authorizing Provider  amLODipine (NORVASC) 5 MG tablet Take 5 mg by mouth daily.   Yes Historical Provider, MD  Ascorbic Acid (VITAMIN C PO) Take 1 tablet by mouth daily.   Yes Historical Provider, MD  aspirin 81 MG tablet Take 81 mg by mouth daily.   Yes Historical Provider, MD  atenolol (TENORMIN) 100 MG tablet Take 100 mg by mouth daily.   Yes Historical Provider, MD  clopidogrel (PLAVIX) 75 MG tablet Take 1 tablet (75 mg total) by mouth daily. 05/14/14  Yes Isaiah Serge, NP  ezetimibe (ZETIA) 10 MG tablet Take 10 mg by mouth daily.   Yes Historical Provider, MD  Glucos-Chondroit-Hyaluron-MSM (GLUCOSAMINE CHONDROITIN JOINT PO) Take 1 tablet by mouth daily.   Yes Historical Provider, MD  losartan-hydrochlorothiazide (HYZAAR) 100-25 MG per tablet Take 1 tablet by mouth daily.   Yes Historical Provider, MD  metFORMIN (GLUCOPHAGE) 500 MG tablet Take 500 mg daily   Yes Historical Provider, MD  omega-3 acid ethyl esters (LOVAZA) 1 G capsule Take 1 g by mouth daily.   Yes Historical Provider, MD  pravastatin (PRAVACHOL) 20 MG tablet Take 10 mg by mouth daily.   Yes Historical Provider, MD   Past Medical History  Diagnosis Date  . HTN (hypertension)   . HLD (hyperlipidemia)   . Kidney stones   . CAD (coronary artery disease)     a. 1998 s/p PCI;  b. 12/2012 MV: EF 68%, no ischemia.  . Diabetes mellitus without complication    Past Surgical History  Procedure Laterality Date  . Lithotripsy   1990's  . Nephrostomy  1990's  . Coronary angioplasty  1998  . Blepharoplasty Bilateral 2000's  . Coronary angioplasty with stent placement  05/07/14    stents to LAD and RCA, patent LCX stent   . Left heart catheterization with coronary angiogram N/A 05/07/2014    Procedure: LEFT HEART CATHETERIZATION WITH CORONARY ANGIOGRAM;  Surgeon: Peter M Martinique, MD;  Location: [facility] CATH LAB;  Service: Cardiovascular;  Laterality: N/A;  . Percutaneous coronary stent intervention (pci-s)  05/07/2014    Procedure: PERCUTANEOUS CORONARY STENT INTERVENTION (PCI-S);  Surgeon: Peter M Martinique, MD;  Location: [facility] CATH LAB;  Service: Cardiovascular;;  Drug eluting stent Prox LAD (3.0/18 Orsiro) and drug eluting stent to Mid RCA (2.5/18 Orsiro)   Family History  Problem Relation Age of Onset  . Coronary artery disease    . Heart disease    . Hypertension    . Stroke    . Hypertension Mother    History   Social History  . Marital Status: Married    Spouse Name: N/A  . Number of Children: 5  . Years of Education: N/A   Occupational History  . retired    Social History Main Topics  . Smoking status: Former Research scientist (life sciences)  . Smokeless tobacco: Never Used     Comment: "smoked cigarettes a thousand years ago; maybe a cigarette qod for a time"  . Alcohol Use: No  . Drug  Use: No  . Sexual Activity: Not Currently   Other Topics Concern  . Not on file   Social History Narrative   ROS-see HPI   Negative unless "+" Constitutional:    weight loss, night sweats, fevers, chills, fatigue, lassitude. HEENT:    headaches, difficulty swallowing, tooth/dental problems, sore throat,       sneezing, itching, ear ache, nasal congestion, post nasal drip, snoring CV:    chest pain, orthopnea, PND, swelling in lower extremities, anasarca,                                  dizziness, palpitations Resp:   shortness of breath with exertion or at rest.                productive cough,   non-productive cough, coughing up of  blood.              change in color of mucus.  wheezing.   Skin:    rash or lesions. GI:  No-   heartburn, indigestion, abdominal pain, nausea, vomiting, diarrhea,                 change in bowel habits, loss of appetite GU: dysuria, change in color of urine, no urgency or frequency.   flank pain. MS:   joint pain, stiffness, decreased range of motion, back pain. Neuro-     nothing unusual Psych:  change in mood or affect.  depression or anxiety.   memory loss.  OBJ- Physical Exam General- Alert, Oriented, Affect-appropriate, Distress- none acute, not                overweight Skin- rash-none, lesions- none, excoriation- none Lymphadenopathy- none Head- atraumatic            Eyes- Gross vision intact, PERRLA, conjunctivae and secretions clear            Ears- Hearing, canals-normal            Nose- Clear, no-Septal dev, mucus, polyps, erosion, perforation             Throat- Mallampati II , mucosa clear , drainage- none, tonsils- atrophic Neck- flexible , trachea midline, no stridor , thyroid nl, carotid no bruit Chest - symmetrical excursion , unlabored           Heart/CV- RRR , no murmur , no gallop  , no rub, nl s1 s2                           - JVD- none , edema- none, stasis changes- none, varices- none           Lung- clear to P&A, wheeze- none, cough- none , dullness-none, rub- none           Chest wall-  Abd-  Br/ Gen/ Rectal- Not done, not indicated Extrem- cyanosis- none, clubbing, none, atrophy- none, strength- nl Neuro- grossly intact to observation

## 2014-11-18 ENCOUNTER — Ambulatory Visit (HOSPITAL_BASED_OUTPATIENT_CLINIC_OR_DEPARTMENT_OTHER): Payer: Medicare Other | Attending: Internal Medicine | Admitting: Radiology

## 2014-11-18 VITALS — Ht 66.0 in | Wt 161.0 lb

## 2014-11-18 DIAGNOSIS — R0683 Snoring: Secondary | ICD-10-CM | POA: Insufficient documentation

## 2014-11-18 DIAGNOSIS — G4733 Obstructive sleep apnea (adult) (pediatric): Secondary | ICD-10-CM | POA: Insufficient documentation

## 2014-11-18 DIAGNOSIS — G4710 Hypersomnia, unspecified: Secondary | ICD-10-CM | POA: Diagnosis present

## 2014-11-23 DIAGNOSIS — G4733 Obstructive sleep apnea (adult) (pediatric): Secondary | ICD-10-CM | POA: Diagnosis not present

## 2014-11-23 NOTE — Sleep Study (Signed)
   NAME: [place] DATE OF BIRTH:  [birth year] MEDICAL RECORD NUMBER [id number]  [facility]  PHYSICIAN: YOUNG,CLINTON D  DATE OF STUDY: 11/18/2014  SLEEP STUDY TYPE: Nocturnal Polysomnogram               REFERRING PHYSICIAN: Baird Lyons D, MD  INDICATION FOR STUDY: Hypersomnia with sleep apnea  EPWORTH SLEEPINESS SCORE:   17/24 HEIGHT: 5\' 6"  (167.6 cm)  WEIGHT: 161 lb (73.029 kg)    Body mass index is 26 kg/(m^2).  NECK SIZE: 16.5 in.  MEDICATIONS: Charted for review  SLEEP ARCHITECTURE: Split study protocol. During the diagnostic phase, total sleep time 159 minutes with sleep efficiency 90.6%. Stage I was 15.4%, stage II 74.8%, stage III absent, REM 9.7% of total sleep time. Sleep latency 6 minutes, REM latency 58.5 minutes, awake after sleep onset 8.5 minutes, arousal index 41.1, bedtime medication: None  RESPIRATORY DATA: Apnea hypopnea index (AHI) 23.4 per hour. 62 total events scored including one obstructive apnea, 4 central apneas, 1 mixed apnea, 56 hypopneas. All events were recorded nonsupine. REM AHI 31 per hour. CPAP was titrated to 11 CWP, AHI 3.7 per hour. He wore a small fullface mask.  OXYGEN DATA: Moderate snoring before CPAP with oxygen desaturation to a nadir of 88%. With CPAP control, snoring was prevented and mean oxygen saturation was 94.2%.  CARDIAC DATA: Sinus rhythm with occasional PAC  MOVEMENT/PARASOMNIA: A few incidental limb jerks were noted within significant effect on sleep. No bathroom trips  IMPRESSION/ RECOMMENDATION:   1) Moderate obstructive sleep apnea/hypopnea syndrome, AHI 23.4 per hour with nonsupine events. REM AHI 31 per hour. Moderate snoring with oxygen desaturation to a nadir of 88% on room air. 2) Successful CPAP titration to 11 CWP, AHI 3.7 per hour. He wore a small ResMed AirFit F-10 fullface mask with heated humidifier. Snoring was prevented and mean oxygen saturation was 94.2% on room air    [place], American Board of Sleep Medicine  ELECTRONICALLY SIGNED ON:  11/23/2014, 11:41 AM [place] PH: [phone]   FX: [phone] [place]

## 2014-12-02 ENCOUNTER — Ambulatory Visit: Payer: Medicare Other | Admitting: Internal Medicine

## 2014-12-23 ENCOUNTER — Other Ambulatory Visit: Payer: Self-pay

## 2015-02-10 ENCOUNTER — Ambulatory Visit (INDEPENDENT_AMBULATORY_CARE_PROVIDER_SITE_OTHER): Payer: Medicare Other | Admitting: Internal Medicine

## 2015-02-10 ENCOUNTER — Encounter: Payer: Self-pay | Admitting: Internal Medicine

## 2015-02-10 VITALS — BP 118/62 | HR 52 | Ht 66.0 in | Wt 160.0 lb

## 2015-02-10 DIAGNOSIS — G4733 Obstructive sleep apnea (adult) (pediatric): Secondary | ICD-10-CM

## 2015-02-10 DIAGNOSIS — I2583 Coronary atherosclerosis due to lipid rich plaque: Secondary | ICD-10-CM

## 2015-02-10 DIAGNOSIS — I2510 Atherosclerotic heart disease of native coronary artery without angina pectoris: Secondary | ICD-10-CM | POA: Diagnosis not present

## 2015-02-10 NOTE — Patient Instructions (Signed)
Order- new DME (wife uses [employer]) new CPAP auto 10-15, mask of choice, humidifier, supplies    Dx OSA  Please call as needed

## 2015-02-10 NOTE — Progress Notes (Signed)
09/12/14- 51 yoM never smoker referred courtesy of Dr Levin Erp for sleep evaluation; noticed in [facility] (for surgery) that he stopped breathing. He is aware that he snores. Not noticing daytime sleepiness, no coffee. Bedtime 9-12MN, 1 minute sleep latency, up 2-3 times before getting up 6:30-7:30AM. Weight up 20 lbs in past 2 years, and recently dx'd DM. Known HBP. Cardiac stents . Some seasonal rhinitis. Half-brother has CPAP.  02/10/15-73 yoM never smoker referred courtesy of Dr Levin Erp for sleep evaluation; noticed in [facility] (for surgery) that he stopped breathing. NPSG 11/18/2014-moderate obstructive sleep apnea, AHI 23.4 per hour, desaturation to 88%, CPAP titrated to 11, body weight 161 pounds Follows For: Doing well since last visit. Sleeping about 7-8 hours nightly.   ROS-see HPI   Negative unless "+" Constitutional:    weight loss, night sweats, fevers, chills, fatigue, lassitude. HEENT:    headaches, difficulty swallowing, tooth/dental problems, sore throat,       sneezing, itching, ear ache, nasal congestion, post nasal drip, snoring CV:    chest pain, orthopnea, PND, swelling in lower extremities, anasarca,                                              dizziness, palpitations Resp:   shortness of breath with exertion or at rest.                productive cough,   non-productive cough, coughing up of blood.              change in color of mucus.  wheezing.   Skin:    rash or lesions. GI:  No-   heartburn, indigestion, abdominal pain, nausea, vomiting, GU:  MS:   joint pain, stiffness,  Neuro-     nothing unusual Psych:  change in mood or affect.  depression or anxiety.   memory loss.  OBJ- Physical Exam General- Alert, Oriented, Affect-appropriate, Distress- none acute, not overweight Skin- rash-none, lesions- none, excoriation- none Lymphadenopathy- none Head- atraumatic            Eyes- Gross vision intact, PERRLA, conjunctivae and secretions clear            Ears-  Hearing, canals-normal            Nose- Clear, no-Septal dev, mucus, polyps, erosion, perforation             Throat- Mallampati II , mucosa clear , drainage- none, tonsils- atrophic, own teeth Neck- flexible , trachea midline, no stridor , thyroid nl, carotid no bruit Chest - symmetrical excursion , unlabored           Heart/CV- RRR , no murmur , no gallop  , no rub, nl s1 s2                           - JVD- none , edema- none, stasis changes- none, varices- none           Lung- clear to P&A, wheeze- none, cough- none , dullness-none, rub- none           Chest wall-  Abd-  Br/ Gen/ Rectal- Not done, not indicated Extrem- cyanosis- none, clubbing, none, atrophy- none, strength- nl Neuro- grossly intact to observation

## 2015-02-12 NOTE — Assessment & Plan Note (Signed)
Moderate obstructive sleep apnea with significant potential aggravation of his coronary artery disease and hypertension. We discussed medical concerns, driving safety, treatments. Plan-start CPAP

## 2015-02-12 NOTE — Assessment & Plan Note (Signed)
Known coronary artery disease with history of stenting. Significant potential for interaction between obstructive sleep apnea and his heart sees, discussed.

## 2015-05-14 ENCOUNTER — Ambulatory Visit: Payer: Medicare Other | Admitting: Internal Medicine

## 2015-05-23 ENCOUNTER — Ambulatory Visit: Payer: Medicare Other | Admitting: Internal Medicine

## 2015-08-29 ENCOUNTER — Encounter: Payer: Self-pay | Admitting: Internal Medicine

## 2015-09-12 IMAGING — CT CT HEAD W/O CM
1 of 2 series · 15 of 30 positions shown, 19 images · non-contrast
Comparison: None.

CLINICAL DATA: Altered mental status. The patient had chest pain
yesterday resulting in a cardiac catheterization last night.
Hypertension.

EXAM:
CT HEAD WITHOUT CONTRAST
TECHNIQUE: Contiguous axial images were obtained from the base of the skull
through the vertex without intravenous contrast.

[Series 3: head 2.0 h70h · axial · 0.46mm/px · z∈[-139,+5]mm · 15 of 82 slices shown, 19 images]
[im 5/82  brain]
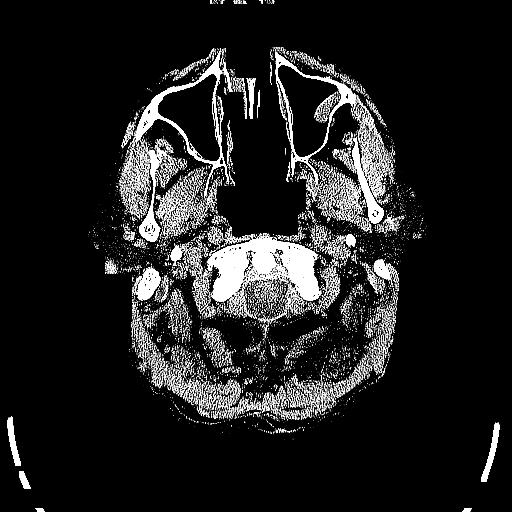
[im 5/82  bone]
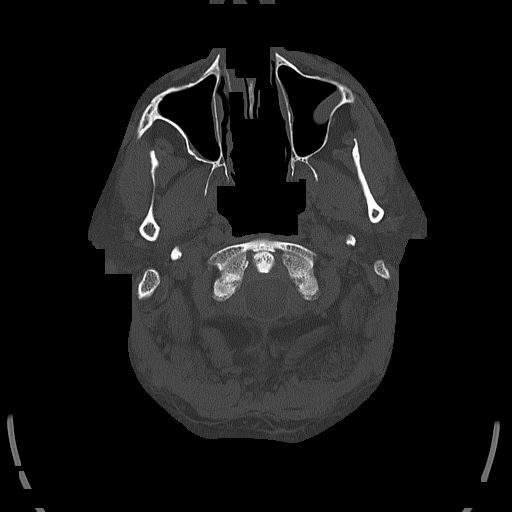
[im 9/82  brain]
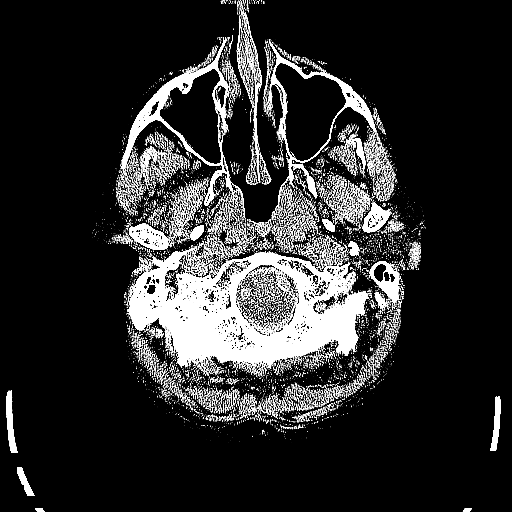
[im 17/82  brain]
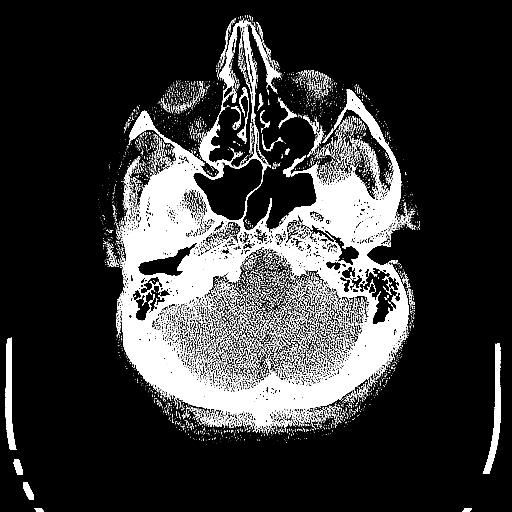
[im 21/82  brain]
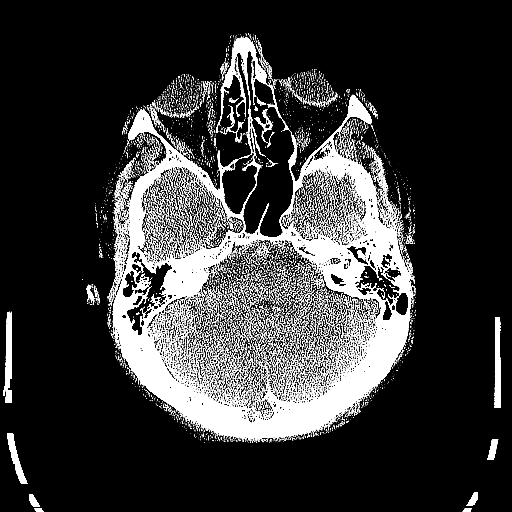
[im 25/82  brain]
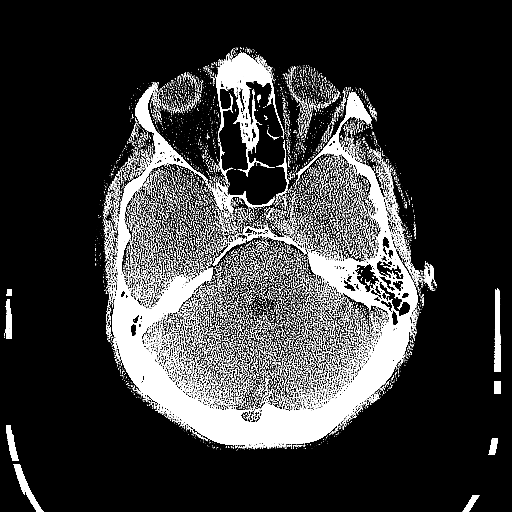
[im 25/82  bone]
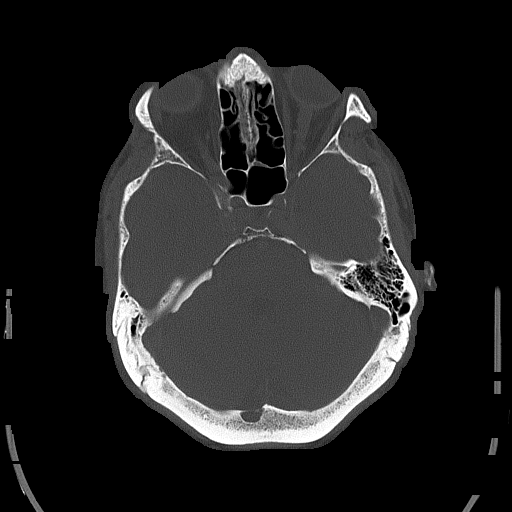
[im 29/82  brain]
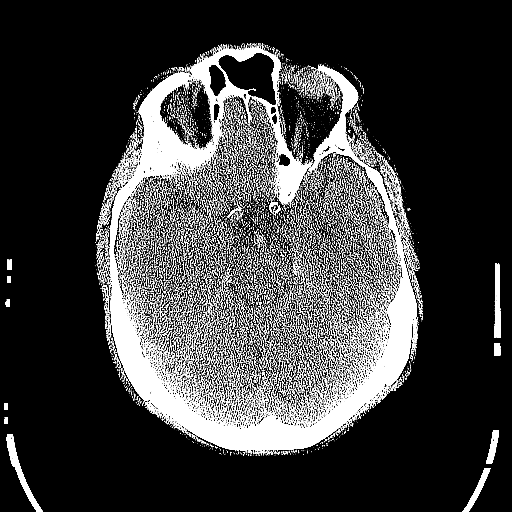
[im 37/82  brain]
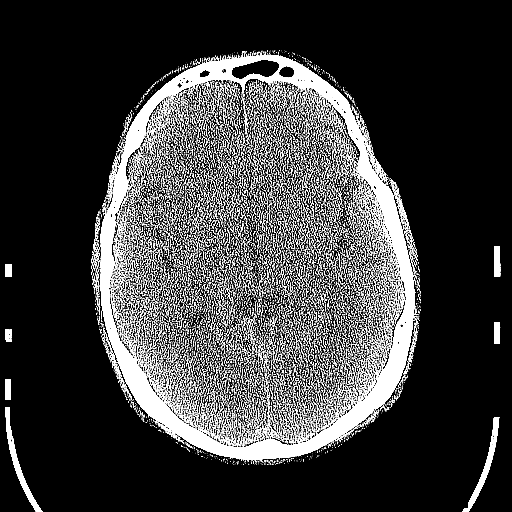
[im 41/82  brain]
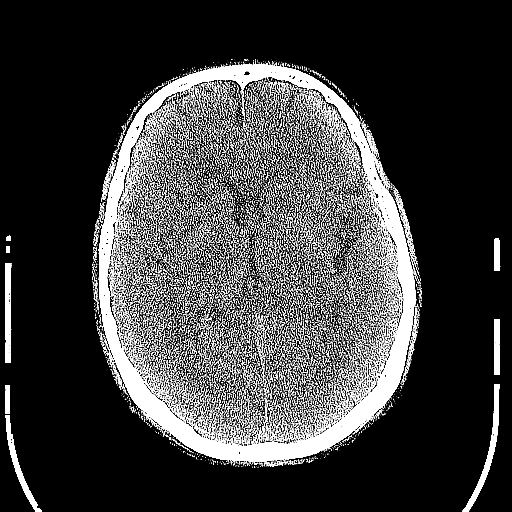
[im 45/82  brain]
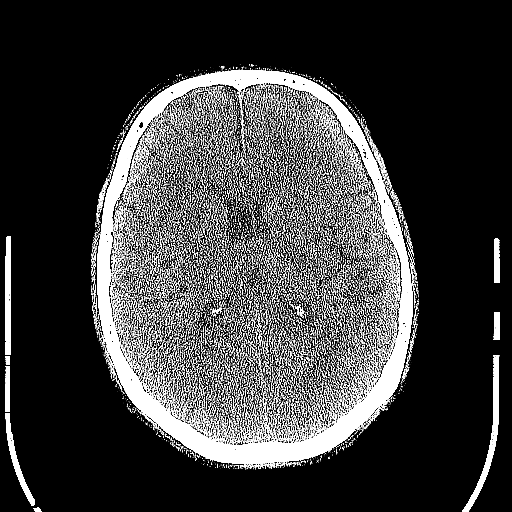
[im 45/82  bone]
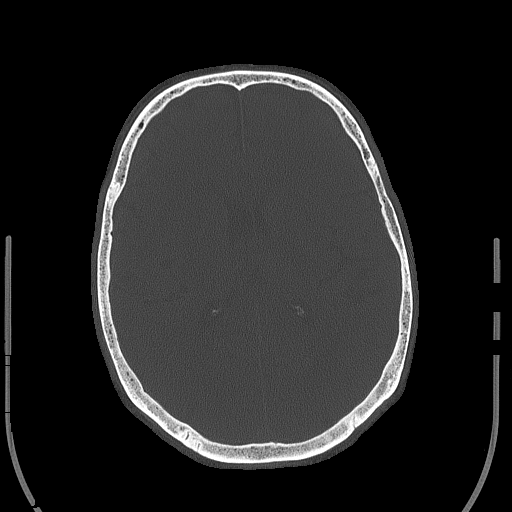
[im 53/82  brain]
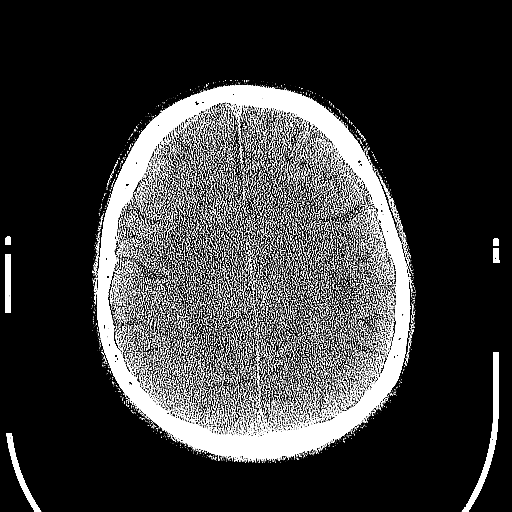
[im 57/82  brain]
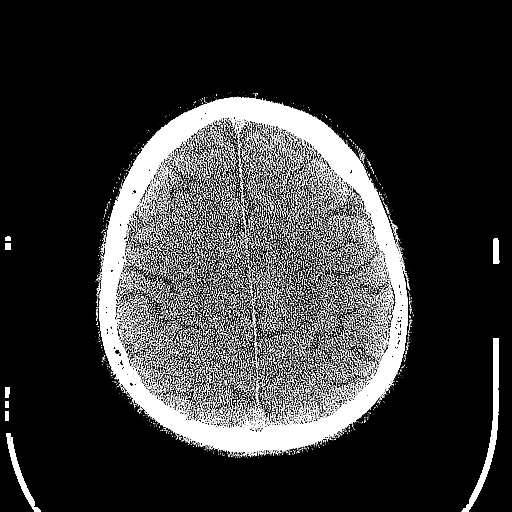
[im 61/82  brain]
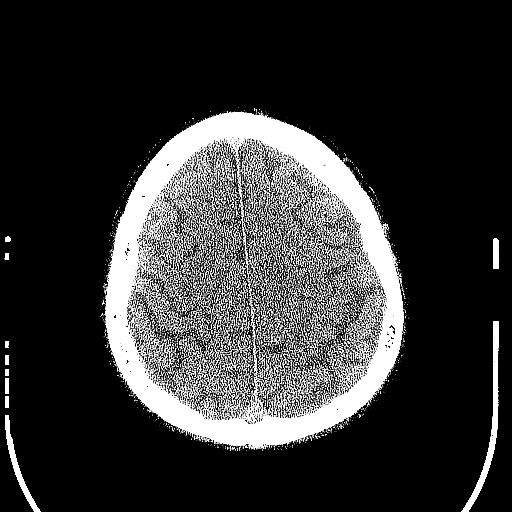
[im 65/82  brain]
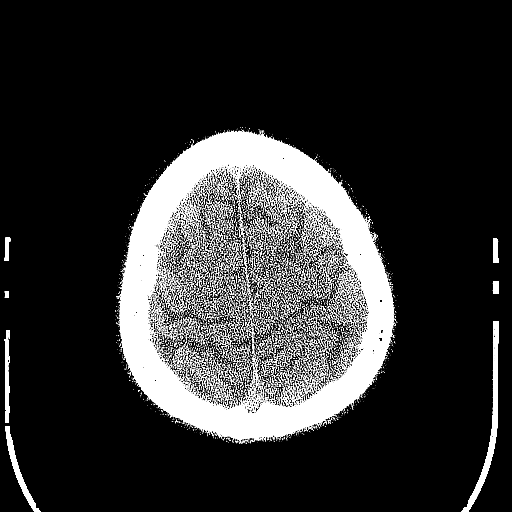
[im 65/82  bone]
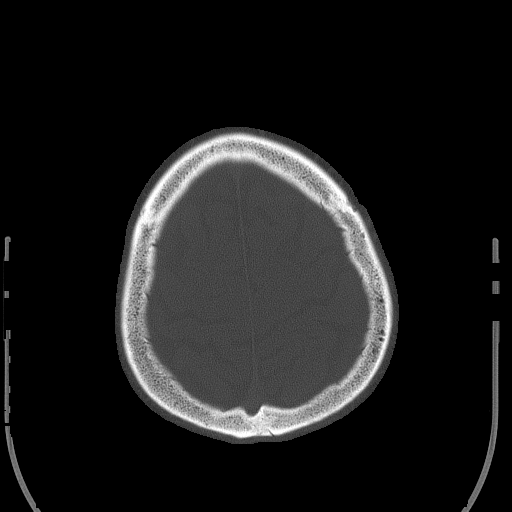
[im 73/82  brain]
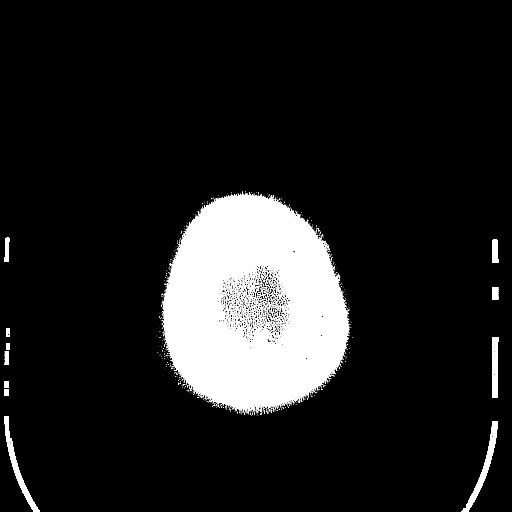
[im 77/82  brain]
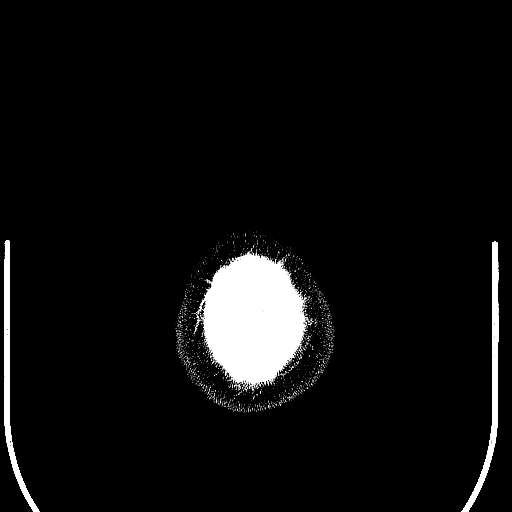

[15 of 30 positions shown; findings below may reference images not displayed]

FINDINGS: Diffusely high vascular density observed. The patient was not
polycythemic on yesterday's CBC.

The brainstem, cerebellum, cerebral peduncles, thalamus, basal
ganglia, basilar cisterns, and ventricular system appear within
normal limits. Periventricular white matter and corona radiata
hypodensities favor chronic ischemic microvascular white matter
disease. No intracranial hemorrhage, mass lesion, or acute CVA.

Polypoid mucoperiosteal thickening in the left maxillary sinus.
Polypoid mucoperiosteal thickening in both maxillary sinuses.
Mucosal swelling and possible polyps in the nasal cavity. Chronic
ethmoid and left sphenoid sinusitis.
IMPRESSION: 1. Periventricular white matter and corona radiata hypodensities
favor chronic ischemic microvascular white matter disease.
2. Diffusely hypervascular density not attributable to polycythemia.
Although this may be leftover contrast from yesterday's
catheterization, careful correlation with the already requested
basic metabolic profile is recommended to ensure that the creatinine
is within normal limits and that the contrast is being cleared, in
order to rule out contrast nephropathy as a cause for residual
intravascular contrast 14 hours out from the cardiac
catheterization.
3. Chronic paranasal sinusitis. Mucosal swelling in the nasal
cavity.

## 2015-09-16 IMAGING — CR DG CHEST 2V
2 series · 2 of 2 positions shown · non-contrast
Comparison: 05/06/2014

CLINICAL DATA: Shortness of breath, chest pain

EXAM:
CHEST  2 VIEW

[w chest pa]
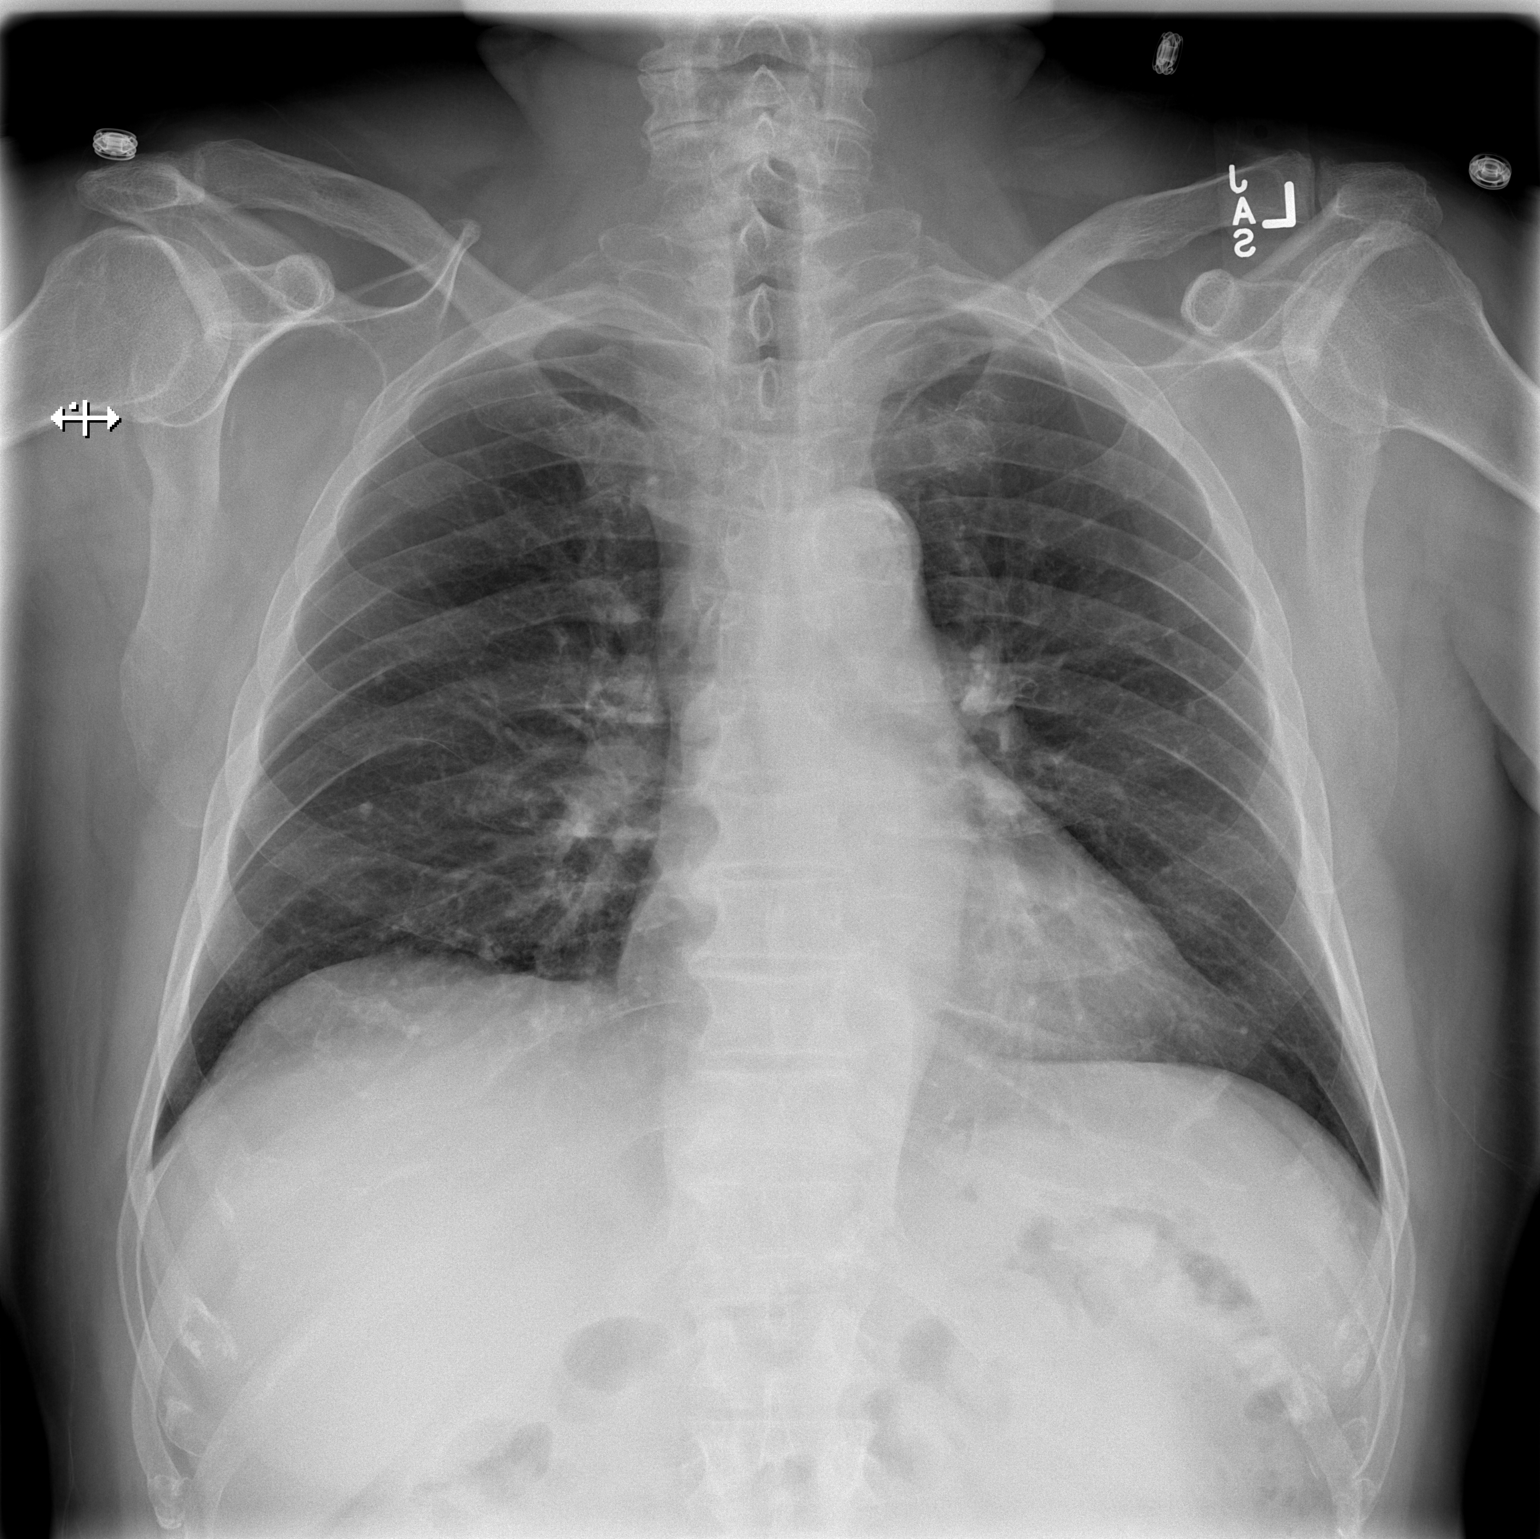

[w chest lat]
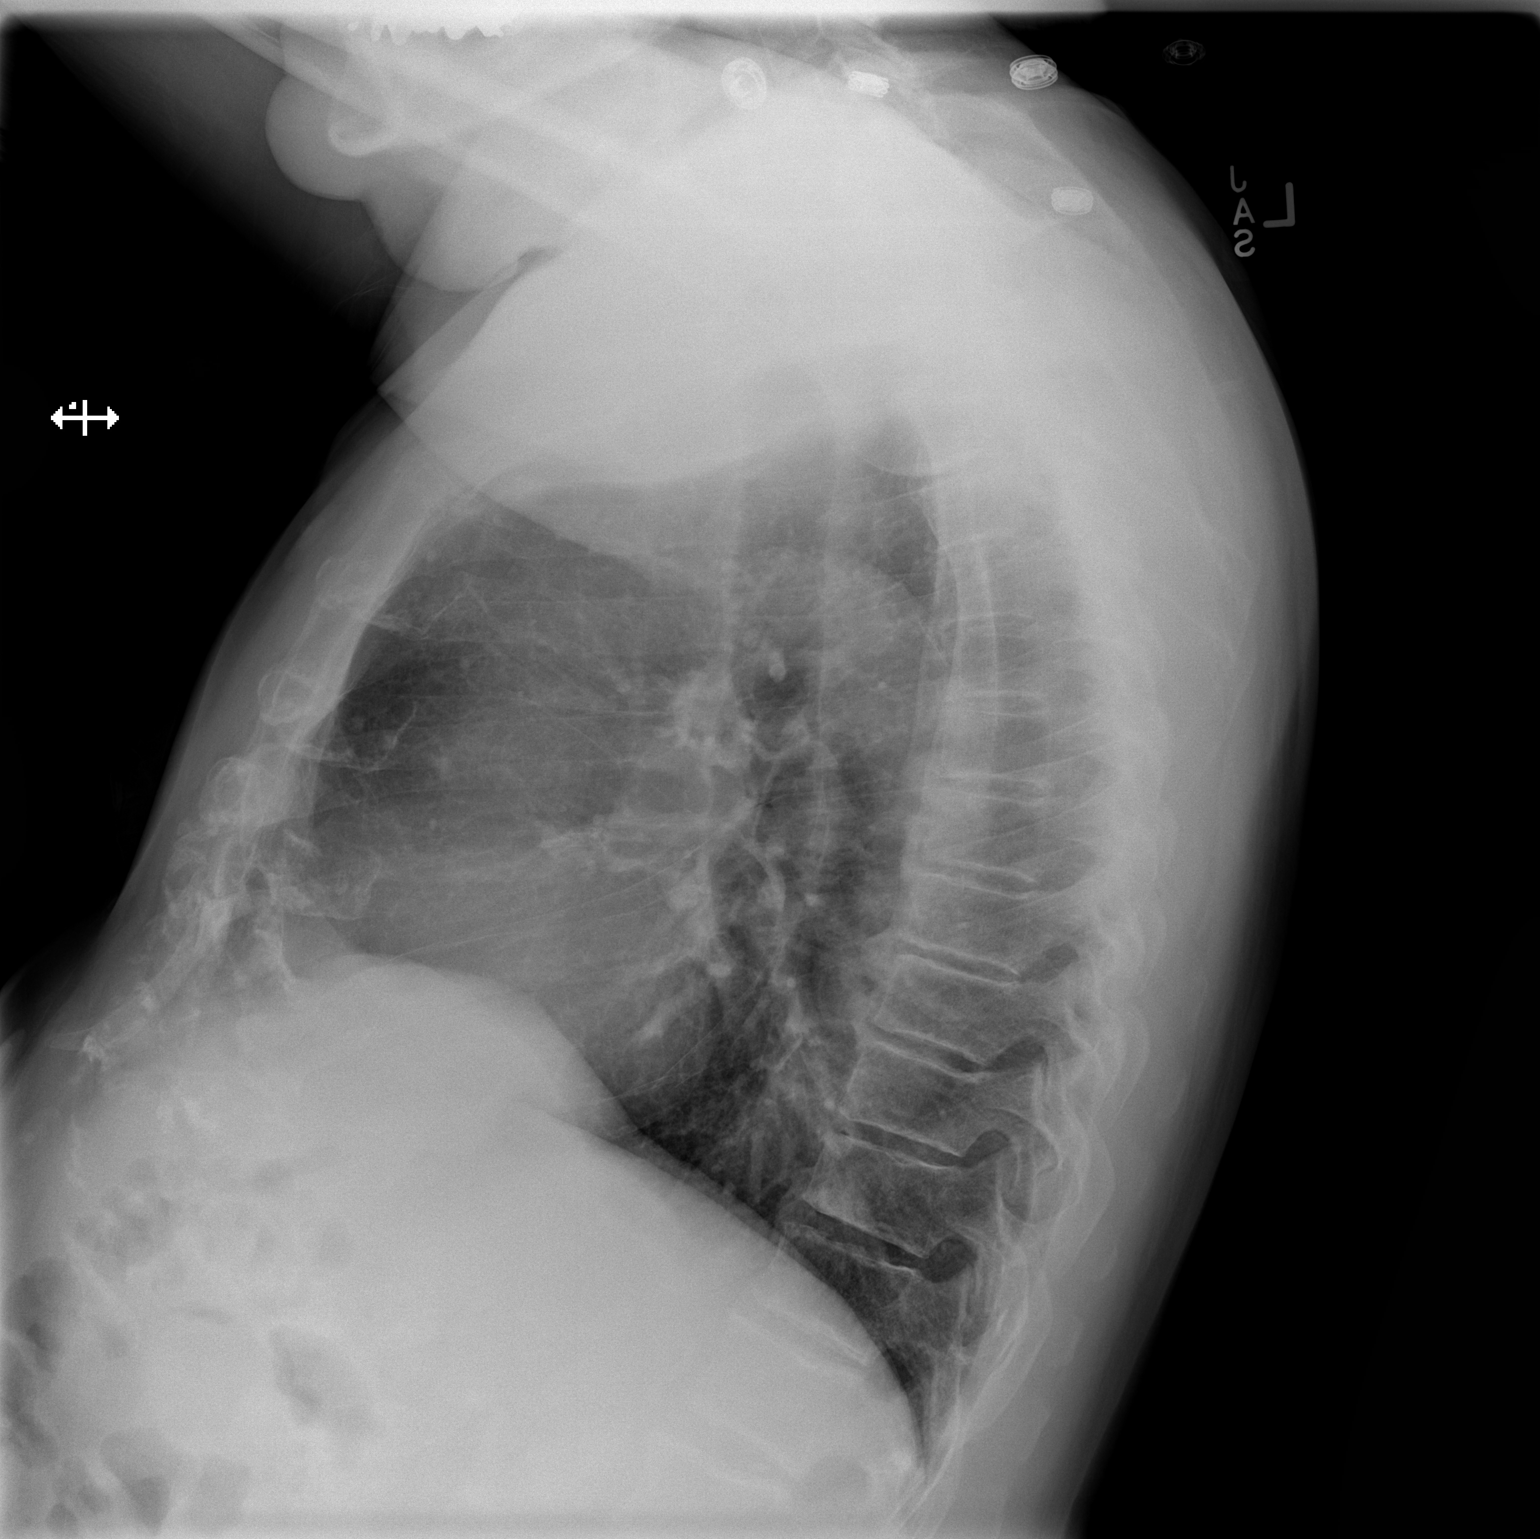

[2 of 2 positions shown; findings below may reference images not displayed]

FINDINGS: The heart size and mediastinal contours are within normal limits.
Both lungs are clear. The visualized skeletal structures are
unremarkable.
IMPRESSION: No active cardiopulmonary disease.

## 2015-09-29 ENCOUNTER — Ambulatory Visit: Payer: Medicare Other | Admitting: Internal Medicine

## 2016-02-19 ENCOUNTER — Telehealth: Payer: Self-pay | Admitting: Internal Medicine

## 2016-02-19 DIAGNOSIS — G4733 Obstructive sleep apnea (adult) (pediatric): Secondary | ICD-10-CM

## 2016-02-19 NOTE — Telephone Encounter (Signed)
Called spoke with pt. He reports he has been through 5-6 different masks for the CPAP. He could not tolerate it and turned in the CPAP about 9 months ago. He reports he is wanting to know what other options are available to treat his sleep apnea. Please advise Dr. Annamaria Boots thanks

## 2016-02-19 NOTE — Telephone Encounter (Signed)
Offer to refer him to Dr Elta Guadeloupe Katz/ orthodontist to consider an oral appliance to treat OSA

## 2016-02-19 NOTE — Telephone Encounter (Signed)
Spoke with pt. Referral placed. Nothing further needed

## 2016-06-30 ENCOUNTER — Ambulatory Visit (INDEPENDENT_AMBULATORY_CARE_PROVIDER_SITE_OTHER): Payer: Medicare Other | Admitting: Nurse Practitioner

## 2016-06-30 ENCOUNTER — Encounter: Payer: Self-pay | Admitting: Nurse Practitioner

## 2016-06-30 ENCOUNTER — Telehealth: Payer: Self-pay | Admitting: Cardiology

## 2016-06-30 VITALS — BP 150/68 | HR 57 | Ht 66.0 in | Wt 163.0 lb

## 2016-06-30 DIAGNOSIS — E782 Mixed hyperlipidemia: Secondary | ICD-10-CM

## 2016-06-30 DIAGNOSIS — R0789 Other chest pain: Secondary | ICD-10-CM

## 2016-06-30 DIAGNOSIS — I25119 Atherosclerotic heart disease of native coronary artery with unspecified angina pectoris: Secondary | ICD-10-CM | POA: Diagnosis not present

## 2016-06-30 DIAGNOSIS — I119 Hypertensive heart disease without heart failure: Secondary | ICD-10-CM | POA: Diagnosis not present

## 2016-06-30 DIAGNOSIS — I209 Angina pectoris, unspecified: Secondary | ICD-10-CM

## 2016-06-30 NOTE — Telephone Encounter (Signed)
Spoke with patient and he woke up yesterday with chest pain, subsided but returned this am when he woke up Stated like when he had stents place before Scheduled appointment with Allie Bossier NP for this am Patient aware of time and location

## 2016-06-30 NOTE — Telephone Encounter (Signed)
New Message   Pt c/o of Chest Pain: 1. Are you having CP right now? No  2. Are you experiencing any other symptoms (ex. SOB, nausea, vomiting, sweating)? no 3. How long have you been experiencing CP? Yesterday and 6am 06/30/16 4. Is your CP continuous or coming and going? Not continuous 5. Have you taken Nitroglycerin? No   Did not go to ER, had these symptoms before he had stints put in

## 2016-06-30 NOTE — Patient Instructions (Signed)
Medication Instructions: Ignacia Bayley, NP recommends that you continue on your current medications as directed. Please refer to the Current Medication list given to you today.  Labwork: NONE ORDERED  Testing/Procedures: 1. Lexiscan Myoview stress test - Your physician has requested that you have a lexiscan myoview. For further information please visit [email or web address]. Please follow instruction sheet, as given.  Follow-up: Your physician recommends that you schedule a follow-up appointment in 6 months with Dr Martinique. You will receive a reminder letter in the mail two months in advance. If you don't receive a letter, please call our office to schedule the follow-up appointment.  If you need a refill on your cardiac medications before your next appointment, please call your pharmacy.

## 2016-06-30 NOTE — Progress Notes (Signed)
Office Visit    Patient Name: Jeffery Schmidt Date of Encounter: 06/30/2016  Primary Care Provider:  Criselda Peaches, MD Primary Cardiologist:  P. Martinique, MD   Chief Complaint    76 y/o ? with a h/o CAD s/p prior LCX/LAD/RCA stenting, HTN, HL, DM II, OSA, and diastolic dysfxn, who presents for f/u related to chest pain.  Past Medical History    Past Medical History:  Diagnosis Date  . CAD (coronary artery disease)    a. 1998 s/p BMS to LCX;  b. 12/2012 MV: EF 68%, no ischemia;  c. 04/2014 Cath/PCI: LM nl, LAD 90p (3.0x18 Biotronik Orsiro DES - 3.5), LCX 67m, patent stent, RCA  90 (2.5x18 Biotronik Orsiro DES - 2.75)  . Diabetes mellitus without complication ([place])   . Diastolic dysfunction    a. 04/2014 Echo: EF 60-65%, no rwma, Gr1 DD, mild MR, mildly dil LA.  [name] HLD (hyperlipidemia)   . HTN (hypertension)   . Kidney stones   . Obstructive sleep apnea    a. on CPAP   Past Surgical History:  Procedure Laterality Date  . BLEPHAROPLASTY Bilateral 2000's  . CORONARY ANGIOPLASTY  1998  . CORONARY ANGIOPLASTY WITH STENT PLACEMENT  05/07/14   stents to LAD and RCA, patent LCX stent   . LEFT HEART CATHETERIZATION WITH CORONARY ANGIOGRAM N/A 05/07/2014   Procedure: LEFT HEART CATHETERIZATION WITH CORONARY ANGIOGRAM;  Surgeon: Peter M Martinique, MD;  Location: [facility] CATH LAB;  Service: Cardiovascular;  Laterality: N/A;  . LITHOTRIPSY  1990's  . NEPHROSTOMY  1990's  . PERCUTANEOUS CORONARY STENT INTERVENTION (PCI-S)  05/07/2014   Procedure: PERCUTANEOUS CORONARY STENT INTERVENTION (PCI-S);  Surgeon: Peter M Martinique, MD;  Location: [facility] CATH LAB;  Service: Cardiovascular;;  Drug eluting stent Prox LAD (3.0/18 Orsiro) and drug eluting stent to Mid RCA (2.5/18 Orsiro)    Allergies  Allergies  Allergen Reactions  . Brilinta [Ticagrelor]   . Epinephrine     unknown    History of Present Illness    76 year old male with prior history of coronary artery disease status post remote  bare-metal stenting of the left circumflex in 1998 with subsequent stenting of the LAD and RCA in November 2015. He also has a history of hypertension, hyperlipidemia, diabetes, OSA, and grade 1 diastolic dysfunction. He was last seen in clinic in March 2016.  Since his last visit he had done reasonably well and has remained reasonably active without significant symptoms or limitations within yesterday morning, shortly after awakening, he had an episode of fleeting chest discomfort which she describes as feeling as though a hook grabbed him in the chest, pulled for a few seconds, and resolve spontaneously. There were no associated symptoms. He had no recurrence of symptoms throughout the day yesterday. This morning, this happened again shortly after awakening, again lasting a few seconds without associated symptoms. He is getting ready to drive down to his home in [place] where he plans to stay for the next 3 months. He would like to have his chest pain evaluated prior to driving down to [place]. He denies PND, orthopnea, dizziness, 60, edema, or early satiety. His blood pressure is elevated today but he checks it radially at home and notes that he is typically in the 120s to 130s. He says he did take his blood pressure medicine late today and that might account for why it is higher than usual.  Home Medications    Prior to Admission medications   Medication Sig Start  Date End Date Taking? Authorizing Provider  amLODipine (NORVASC) 5 MG tablet Take 5 mg by mouth daily.   Yes Historical Provider, MD  Ascorbic Acid (VITAMIN C PO) Take 1 tablet by mouth daily.   Yes Historical Provider, MD  aspirin 81 MG tablet Take 81 mg by mouth daily.   Yes Historical Provider, MD  atenolol (TENORMIN) 100 MG tablet Take 100 mg by mouth daily.   Yes Historical Provider, MD  clopidogrel (PLAVIX) 75 MG tablet Take 1 tablet (75 mg total) by mouth daily. 05/14/14  Yes Isaiah Serge, NP  ezetimibe (ZETIA) 10 MG tablet Take  10 mg by mouth daily.   Yes Historical Provider, MD  Glucos-Chondroit-Hyaluron-MSM (GLUCOSAMINE CHONDROITIN JOINT PO) Take 1 tablet by mouth daily.   Yes Historical Provider, MD  losartan-hydrochlorothiazide (HYZAAR) 100-25 MG per tablet Take 1 tablet by mouth daily.   Yes Historical Provider, MD  metFORMIN (GLUCOPHAGE) 500 MG tablet Take 500 mg daily   Yes Historical Provider, MD  omega-3 acid ethyl esters (LOVAZA) 1 G capsule Take 1 g by mouth daily.   Yes Historical Provider, MD  pravastatin (PRAVACHOL) 20 MG tablet Take 10 mg by mouth daily.   Yes Historical Provider, MD    Review of Systems    2 episodes of fleeting sharp chest discomfort since yesterday. He denies dyspnea, palpitations, PND, orthopnea, dizziness, syncope, edema, or early satiety. All other systems reviewed and are otherwise negative except as noted above.  Physical Exam    VS:  Ht 5\' 6"  (QA348G m)   Wt 163 lb (73.9 kg)   BMI 26.31 kg/m  , BMI Body mass index is 26.31 kg/m. GEN: Well nourished, well developed, in no acute distress.  HEENT: normal.  Neck: Supple, no JVD, carotid bruits, or masses. Cardiac: RRR, no murmurs, rubs, or gallops. No clubbing, cyanosis, edema.  Radials/DP/PT 2+ and equal bilaterally.  Respiratory:  Respirations regular and unlabored, clear to auscultation bilaterally. GI: Soft, nontender, nondistended, BS + x 4. MS: no deformity or atrophy. Skin: warm and dry, no rash. Neuro:  Strength and sensation are intact. Psych: Normal affect.  Accessory Clinical Findings    ECG -  Size bradycardia, 57, first-degree AV block, prior anterior infarct, no acute ST or T changes.  Assessment & Plan    1.  Atypical chest pain/CAD: Patient presents today following 2 episodes of sharp, focal, and fleeting chest discomfort over the past 2 days. He has not had any exertional symptoms. Looking back at prior notes, in 2015 chest discomfort was exertional in nature and associated with dyspnea. He is chest  pain-free currently. He is getting ready to drive down to [place] where he will spend the next 3 months and is concerned about his symptoms. I will arrange for a Lexiscan Myoview to rule out ischemia. He will stay in town until we have results. He will otherwise remain on aspirin, beta blocker, Plavix, ARB, statin, and Zetia therapy.  2.  Hypertensive Heart Disease: blood pressure is elevated today but he notes that he took his blood pressure medicine late this morning. He checks his blood pressure regularly at home and it typically runs in the 120s to 130s. In that setting, I have recommended he continue to follow his blood pressure at home and contact us if he is trending higher than 1:30 on a usual basis. If so, I would look to switch his atenolol to carvedilol.  3.  HL:  Last LDL that we have on record  was 93 in 04/2014. this is followed by primary care and he remains on statin therapy.  4.  DM II: this is followed closely by primary care. His A1c is below 6. He is on metformin once daily.  5.  Disposition: follow-up Lexiscan Myoview as above. If stable, he can follow-up in 6 months.  Murray Hodgkins, NP 06/30/2016, 11:18 AM

## 2016-07-02 ENCOUNTER — Ambulatory Visit (HOSPITAL_COMMUNITY)
Admission: RE | Admit: 2016-07-02 | Discharge: 2016-07-02 | Disposition: A | Discharge status: Home / Self Care | Payer: Medicare Other | Source: Ambulatory Visit | Attending: Internal Medicine | Admitting: Internal Medicine

## 2016-07-02 DIAGNOSIS — R0789 Other chest pain: Secondary | ICD-10-CM | POA: Insufficient documentation

## 2016-07-02 LAB — MYOCARDIAL PERFUSION IMAGING
CHL CUP NUCLEAR SSS: 2
LV dias vol: 101 mL (ref 62–150)
LVSYSVOL: 40 mL
NUC STRESS TID: 1.31
Peak HR: 82 {beats}/min
Rest HR: 54 {beats}/min
SDS: 1
SRS: 1

## 2016-07-02 MED ORDER — TECHNETIUM TC 99M TETROFOSMIN IV KIT
10.6000 | PACK | Freq: Once | INTRAVENOUS | Status: AC | PRN
  Administered 2016-07-02: 10.6 via INTRAVENOUS
  Filled 2016-07-02: qty 11

## 2016-07-02 MED ORDER — TECHNETIUM TC 99M TETROFOSMIN IV KIT
31.1000 | PACK | Freq: Once | INTRAVENOUS | Status: AC | PRN
  Administered 2016-07-02: 31.1 via INTRAVENOUS
  Filled 2016-07-02: qty 32

## 2016-07-02 MED ORDER — REGADENOSON 0.4 MG/5ML IV SOLN
0.4000 mg | Freq: Once | INTRAVENOUS | Status: AC
  Administered 2016-07-02: 0.4 mg via INTRAVENOUS

## 2016-09-23 ENCOUNTER — Encounter: Payer: Self-pay | Admitting: Gastroenterology

## 2017-03-31 ENCOUNTER — Telehealth: Payer: Self-pay

## 2017-03-31 ENCOUNTER — Ambulatory Visit (INDEPENDENT_AMBULATORY_CARE_PROVIDER_SITE_OTHER): Payer: Medicare Other | Admitting: Gastroenterology

## 2017-03-31 ENCOUNTER — Encounter: Payer: Self-pay | Admitting: Gastroenterology

## 2017-03-31 VITALS — BP 128/60 | HR 64 | Ht 66.0 in | Wt 162.0 lb

## 2017-03-31 DIAGNOSIS — I25119 Atherosclerotic heart disease of native coronary artery with unspecified angina pectoris: Secondary | ICD-10-CM | POA: Diagnosis not present

## 2017-03-31 DIAGNOSIS — Z7902 Long term (current) use of antithrombotics/antiplatelets: Secondary | ICD-10-CM | POA: Diagnosis not present

## 2017-03-31 DIAGNOSIS — Z1211 Encounter for screening for malignant neoplasm of colon: Secondary | ICD-10-CM

## 2017-03-31 MED ORDER — AMBULATORY NON FORMULARY MEDICATION: 0 refills | Status: DC

## 2017-03-31 NOTE — Patient Instructions (Signed)
You have been scheduled for a colonoscopy. Please follow written instructions given to you at your visit today.  Please pick up your prep supplies at the pharmacy within the next 1-3 days. If you use inhalers (even only as needed), please bring them with you on the day of your procedure. Your physician has requested that you go to www.startemmi.com and enter the access code given to you at your visit today. This web site gives a general overview about your procedure. However, you should still follow specific instructions given to you by our office regarding your preparation for the procedure.   If you are age 75 or older, your body mass index should be between 23-30. Your Body mass index is 26.15 kg/m. If this is out of the aforementioned range listed, please consider follow up with your Primary Care Provider.  If you are age 2 or younger, your body mass index should be between 19-25. Your Body mass index is 26.15 kg/m. If this is out of the aformentioned range listed, please consider follow up with your Primary Care Provider.   You will be contacted by our office prior to your procedure for directions on holding your Plavix.  If you do not hear from our office 1 week prior to your scheduled procedure, please call [phone] to discuss.   Thank you.

## 2017-03-31 NOTE — Telephone Encounter (Signed)
   Jeffery Schmidt 05-10-1941 [id number]  Dear Dr. Dorene Ar:  We have scheduled the above named patient for a(n) colonoscopy procedure. Our records show that (s)he is on anticoagulation therapy.  Please advise as to whether the patient may come off their therapy of Plavix 5 days prior to their procedure which is scheduled for 04-21-17.  Please route your response to Lemar Lofty or fax response to [phone].  Sincerely,    [facility]

## 2017-03-31 NOTE — Progress Notes (Signed)
HPI :  76 y/o male with history of CAD on plavix, DM, diastolic dysfunction, HTN, HLD, OSA, here for a new patient evaluation to discuss colon cancer screening.  No blood in the stools. No trouble with bowel habits in general - no diarrhea or constipation. No abdominal pains. No weight loss.  No known FH of colon cacner. He has 3 coronary stents in place for CAD, last placed in 2015. On aspirin and plavix 75mg . No shortness of breath or chest pains. He had a nuclear stress test in January 2018 which was a low risk test with EF 55-60%.  He otherwise feels well without complaints today.   Nuclear stress test 07/02/2016 - EF 55-60%, low risk study for ischemia otherwise  Colonoscopy 10/28/2006 - 45mm sigmoid polyp, hyperplastic  Past Medical History:  Diagnosis Date  . CAD (coronary artery disease)    a. 1998 s/p BMS to LCX;  b. 12/2012 MV: EF 68%, no ischemia;  c. 04/2014 Cath/PCI: LM nl, LAD 90p (3.0x18 Biotronik Orsiro DES - 3.5), LCX 61m, patent stent, RCA  90 (2.5x18 Biotronik Orsiro DES - 2.75)  . Diabetes mellitus without complication ([place])   . Diastolic dysfunction    a. 04/2014 Echo: EF 60-65%, no rwma, Gr1 DD, mild MR, mildly dil LA.  [name] HLD (hyperlipidemia)   . HTN (hypertension)   . Kidney stones   . Obstructive sleep apnea    a. on CPAP     Past Surgical History:  Procedure Laterality Date  . BLEPHAROPLASTY Bilateral 2000's  . CORONARY ANGIOPLASTY  1998  . CORONARY ANGIOPLASTY WITH STENT PLACEMENT  05/07/14   stents to LAD and RCA, patent LCX stent   . LEFT HEART CATHETERIZATION WITH CORONARY ANGIOGRAM N/A 05/07/2014   Procedure: LEFT HEART CATHETERIZATION WITH CORONARY ANGIOGRAM;  Surgeon: Peter M Martinique, MD;  Location: [facility] CATH LAB;  Service: Cardiovascular;  Laterality: N/A;  . LITHOTRIPSY  1990's  . NEPHROSTOMY  1990's  . PERCUTANEOUS CORONARY STENT INTERVENTION (PCI-S)  05/07/2014   Procedure: PERCUTANEOUS CORONARY STENT INTERVENTION (PCI-S);  Surgeon: Peter M Martinique,  MD;  Location: [facility] CATH LAB;  Service: Cardiovascular;;  Drug eluting stent Prox LAD (3.0/18 Orsiro) and drug eluting stent to Mid RCA (2.5/18 Orsiro)   Family History  Problem Relation Age of Onset  . Hypertension Mother   . Coronary artery disease Unknown   . Heart disease Unknown   . Hypertension Unknown   . Stroke Unknown   . Other Father        unknown   Social History  Substance Use Topics  . Smoking status: Former Research scientist (life sciences)  . Smokeless tobacco: Never Used     Comment: "smoked cigarettes a thousand years ago; maybe a cigarette qod for a time"  . Alcohol use No   Current Outpatient Prescriptions  Medication Sig Dispense Refill  . amLODipine (NORVASC) 10 MG tablet Take 10 mg by mouth daily.    . Ascorbic Acid (VITAMIN C PO) Take 1 tablet by mouth daily.    [name] aspirin 81 MG tablet Take 81 mg by mouth daily.    [name] atenolol (TENORMIN) 100 MG tablet Take 100 mg by mouth daily.    . clopidogrel (PLAVIX) 75 MG tablet Take 1 tablet (75 mg total) by mouth daily. 30 tablet 11  . ezetimibe (ZETIA) 10 MG tablet Take 10 mg by mouth daily.    . Glucos-Chondroit-Hyaluron-MSM (GLUCOSAMINE CHONDROITIN JOINT PO) Take 1 tablet by mouth daily.    [name] losartan-hydrochlorothiazide (HYZAAR) 100-25  MG per tablet Take 1 tablet by mouth daily.    . metFORMIN (GLUCOPHAGE) 500 MG tablet Take 500 mg daily    . omega-3 acid ethyl esters (LOVAZA) 1 G capsule Take 1 g by mouth daily.    . pravastatin (PRAVACHOL) 20 MG tablet Take 10 mg by mouth daily.     No current facility-administered medications for this visit.    Allergies  Allergen Reactions  . Brilinta [Ticagrelor]   . Epinephrine     unknown     Review of Systems: All systems reviewed and negative except where noted in HPI.    No results found.  Physical Exam: BP 128/60   Pulse 64   Ht 5\' 6"  (1.676 m)   Wt 162 lb (73.5 kg) Comment: patient refused to be weighed. Weight given by patient  BMI 26.15 kg/m  Constitutional:  Pleasant,well-developed, male in no acute distress. HEENT: Normocephalic and atraumatic. Conjunctivae are normal. No scleral icterus. Neck supple.  Cardiovascular: Normal rate, regular rhythm.  Pulmonary/chest: Effort normal and breath sounds normal. No wheezing, rales or rhonchi. Abdominal: Soft, nondistended, nontender.  There are no masses palpable.  Extremities: no edema Lymphadenopathy: No cervical adenopathy noted. Neurological: Alert and oriented to person place and time. Skin: Skin is warm and dry. No rashes noted. Psychiatric: Normal mood and affect. Behavior is normal.   ASSESSMENT AND PLAN: 76 year old male with a history of coronary artery disease with coronary stents in place on aspirin and Plavix, presenting for discussion regarding screening colonoscopy. He is at average risk for colon cancer and is due for screening at this point if he wanted to have any further screening performed. We discussed that many patients stop having screening at age 50, depending on multiple factors. We discussed not pursuing any further screening versus stool based testing versus optical colonoscopy. Following discussion of all these issues he strongly wished to proceed with optical colonoscopy, he was not comfortable stopping screening at this time. I discussed the risks and benefits of colonoscopy and anesthesia with him and he wanted to proceed. We will seek approval from his cardiologist to hold plavix for 5 days prior to the procedure. Otherwise we'll try using a sample of [place] prep for him. He agreed with the plan.   Jeffery Cellar, MD [facility] Pager [phone]  CC: Levin Erp, MD

## 2017-04-01 NOTE — Telephone Encounter (Signed)
OK to hold the Plavix for 5 days before the procedure . Restart when ok with Dr Havery Moros afterwards. Continue ASA 81 MG   Darletta Noblett, PA-C 04/01/2017 1:29 PM Beeper [phone]

## 2017-04-01 NOTE — Telephone Encounter (Signed)
Left detailed mess for pt(Jeffery Schmidt)

## 2017-04-01 NOTE — Telephone Encounter (Signed)
Called and spoke to pt.  Told him to hold Plavix for 5 days prior to procedure but to continue on baby aspirin.  He expressed understanding.

## 2017-04-01 NOTE — Telephone Encounter (Signed)
Routed/faxed via EPIC

## 2017-04-21 ENCOUNTER — Encounter: Payer: Self-pay | Admitting: Gastroenterology

## 2017-04-21 ENCOUNTER — Ambulatory Visit (AMBULATORY_SURGERY_CENTER): Payer: Medicare Other | Admitting: Gastroenterology

## 2017-04-21 VITALS — BP 119/68 | HR 54 | Temp 98.4°F | Resp 12 | Ht 66.0 in | Wt 162.0 lb

## 2017-04-21 DIAGNOSIS — D123 Benign neoplasm of transverse colon: Secondary | ICD-10-CM

## 2017-04-21 DIAGNOSIS — Z1211 Encounter for screening for malignant neoplasm of colon: Secondary | ICD-10-CM | POA: Diagnosis not present

## 2017-04-21 DIAGNOSIS — Z1212 Encounter for screening for malignant neoplasm of rectum: Secondary | ICD-10-CM | POA: Diagnosis not present

## 2017-04-21 DIAGNOSIS — D122 Benign neoplasm of ascending colon: Secondary | ICD-10-CM

## 2017-04-21 MED ORDER — SODIUM CHLORIDE 0.9 % IV SOLN: 500.0000 mL | INTRAVENOUS | Status: DC

## 2017-04-21 NOTE — Progress Notes (Signed)
A/ox3 pleased with MAC, report to [facility]

## 2017-04-21 NOTE — Progress Notes (Signed)
Called to room to assist during endoscopic procedure.  Patient ID and intended procedure confirmed with present staff. Received instructions for my participation in the procedure from the performing physician.  

## 2017-04-21 NOTE — Patient Instructions (Signed)
YOU HAD AN ENDOSCOPIC PROCEDURE TODAY AT [facility]:   Refer to the procedure report that was given to you for any specific questions about what was found during the examination.  If the procedure report does not answer your questions, please call your gastroenterologist to clarify.  If you requested that your care partner not be given the details of your procedure findings, then the procedure report has been included in a sealed envelope for you to review at your convenience later.  YOU SHOULD EXPECT: Some feelings of bloating in the abdomen. Passage of more gas than usual.  Walking can help get rid of the air that was put into your GI tract during the procedure and reduce the bloating. If you had a lower endoscopy (such as a colonoscopy or flexible sigmoidoscopy) you may notice spotting of blood in your stool or on the toilet paper. If you underwent a bowel prep for your procedure, you may not have a normal bowel movement for a few days.  Please Note:  You might notice some irritation and congestion in your nose or some drainage.  This is from the oxygen used during your procedure.  There is no need for concern and it should clear up in a day or so.  SYMPTOMS TO REPORT IMMEDIATELY:   Following lower endoscopy (colonoscopy or flexible sigmoidoscopy):  Excessive amounts of blood in the stool  Significant tenderness or worsening of abdominal pains  Swelling of the abdomen that is new, acute  Fever of 100F or higher  For urgent or emergent issues, a gastroenterologist can be reached at any hour by calling [phone].   DIET:  We do recommend a small meal at first, but then you may proceed to your regular diet.  Drink plenty of fluids but you should avoid alcoholic beverages for 24 hours.  ACTIVITY:  You should plan to take it easy for the rest of today and you should NOT DRIVE or use heavy machinery until tomorrow (because of the sedation medicines used during the test).     FOLLOW UP: Our staff will call the number listed on your records the next business day following your procedure to check on you and address any questions or concerns that you may have regarding the information given to you following your procedure. If we do not reach you, we will leave a message.  However, if you are feeling well and you are not experiencing any problems, there is no need to return our call.  We will assume that you have returned to your regular daily activities without incident.  If any biopsies were taken you will be contacted by phone or by letter within the next 1-3 weeks.  Please call us at [phone] if you have not heard about the biopsies in 3 weeks.   Await for biopsy results Polyps (handout given) Hemorrhoids (handout given) Diverticulosis (handout given) Resume Plavix in 3 days, can continue aspirin   SIGNATURES/CONFIDENTIALITY: You and/or your care partner have signed paperwork which will be entered into your electronic medical record.  These signatures attest to the fact that that the information above on your After Visit Summary has been reviewed and is understood.  Full responsibility of the confidentiality of this discharge information lies with you and/or your care-partner.

## 2017-04-21 NOTE — Op Note (Signed)
[facility] Patient Name: Jeffery Schmidt Procedure Date: 04/21/2017 3:22 PM MRN: [id number] Endoscopist: Remo Lipps P. Armbruster MD, MD Age: 76 Referring MD:  Date of Birth: 1941-01-14 Gender: Male Account #: [id number] Procedure:                Colonoscopy Indications:              Screening for colorectal malignant neoplasm Medicines:                Monitored Anesthesia Care Procedure:                Pre-Anesthesia Assessment:                           - Prior to the procedure, a History and Physical                            was performed, and patient medications and                            allergies were reviewed. The patient's tolerance of                            previous anesthesia was also reviewed. The risks                            and benefits of the procedure and the sedation                            options and risks were discussed with the patient.                            All questions were answered, and informed consent                            was obtained. Prior Anticoagulants: The patient has                            taken Plavix (clopidogrel), last dose was 5 days                            prior to procedure. ASA Grade Assessment: III - A                            patient with severe systemic disease. After                            reviewing the risks and benefits, the patient was                            deemed in satisfactory condition to undergo the                            procedure.  After obtaining informed consent, the colonoscope                            was passed under direct vision. Throughout the                            procedure, the patient's blood pressure, pulse, and                            oxygen saturations were monitored continuously. The                            Colonoscope was introduced through the anus and                            advanced to the the cecum, identified by                           appendiceal orifice and ileocecal valve. The                            colonoscopy was performed without difficulty. The                            patient tolerated the procedure well. The quality                            of the bowel preparation was adequate. The                            ileocecal valve, appendiceal orifice, and rectum                            were photographed. Scope In: 3:27:02 PM Scope Out: 3:48:55 PM Scope Withdrawal Time: 0 hours 19 minutes 55 seconds  Total Procedure Duration: 0 hours 21 minutes 53 seconds  Findings:                 The perianal and digital rectal examinations were                            normal.                           A 6 mm polyp was found in the ascending colon. The                            polyp was sessile. The polyp was removed with a                            cold snare. Resection and retrieval were complete.                           A 3 mm polyp was found in the transverse colon. The  polyp was sessile. The polyp was removed with a                            cold snare. Resection and retrieval were complete.                           A few medium-mouthed diverticula were found in the                            sigmoid colon.                           Internal hemorrhoids were found during retroflexion.                           The exam was otherwise without abnormality. Complications:            No immediate complications. Estimated blood loss:                            Minimal. Estimated Blood Loss:     Estimated blood loss was minimal. Impression:               - One 6 mm polyp in the ascending colon, removed                            with a cold snare. Resected and retrieved.                           - One 3 mm polyp in the transverse colon, removed                            with a cold snare. Resected and retrieved.                           - Diverticulosis in  the sigmoid colon.                           - Internal hemorrhoids.                           - The examination was otherwise normal. Recommendation:           - Patient has a contact number available for                            emergencies. The signs and symptoms of potential                            delayed complications were discussed with the                            patient. Return to normal activities tomorrow.                            Written discharge instructions  were provided to the                            patient.                           - Resume previous diet.                           - Continue present medications.                           - Resume Plavix in 3 days, can continue aspirin                           - Await pathology results. Remo Lipps P. Armbruster MD, MD 04/21/2017 3:52:47 PM This report has been signed electronically.

## 2017-04-22 ENCOUNTER — Telehealth: Payer: Self-pay | Admitting: *Deleted

## 2017-04-22 NOTE — Telephone Encounter (Signed)
  Follow up Call-  Call back number 04/21/2017  Post procedure Call Back phone  # [phone]  Permission to leave phone message Yes  Some recent data might be hidden     Patient questions:  Do you have a fever, pain , or abdominal swelling? No. Pain Score  0 *  Have you tolerated food without any problems? Yes.    Have you been able to return to your normal activities? Yes.    Do you have any questions about your discharge instructions: Diet   No. Medications  No. Follow up visit  No.  Do you have questions or concerns about your Care? No.  Actions: * If pain score is 4 or above: No action needed, pain <4.

## 2017-04-27 ENCOUNTER — Encounter: Payer: Self-pay | Admitting: Gastroenterology

## 2018-01-30 ENCOUNTER — Telehealth: Payer: Self-pay | Admitting: Internal Medicine

## 2018-01-30 NOTE — Telephone Encounter (Signed)
Went to lobby to speak with patient. Verified patient's name, dob and what he is requesting. Patient requesting copy of most recent sleep study. Sleep study printed and given to patient.  Nothing further needed at this time; will sign off.

## 2018-02-08 ENCOUNTER — Other Ambulatory Visit: Payer: Self-pay | Admitting: Otolaryngology

## 2018-02-15 ENCOUNTER — Encounter (HOSPITAL_BASED_OUTPATIENT_CLINIC_OR_DEPARTMENT_OTHER): Payer: Self-pay | Admitting: *Deleted

## 2018-02-16 ENCOUNTER — Encounter (HOSPITAL_BASED_OUTPATIENT_CLINIC_OR_DEPARTMENT_OTHER): Payer: Self-pay | Admitting: *Deleted

## 2018-02-16 ENCOUNTER — Other Ambulatory Visit: Payer: Self-pay

## 2018-02-16 ENCOUNTER — Telehealth: Payer: Self-pay | Admitting: Cardiology

## 2018-02-16 ENCOUNTER — Encounter (HOSPITAL_BASED_OUTPATIENT_CLINIC_OR_DEPARTMENT_OTHER)
Admission: RE | Admit: 2018-02-16 | Discharge: 2018-02-16 | Disposition: A | Discharge status: Home / Self Care | Payer: Medicare Other | Source: Ambulatory Visit | Attending: Otolaryngology | Admitting: Otolaryngology

## 2018-02-16 DIAGNOSIS — Z01810 Encounter for preprocedural cardiovascular examination: Secondary | ICD-10-CM | POA: Diagnosis present

## 2018-02-16 DIAGNOSIS — Z01812 Encounter for preprocedural laboratory examination: Secondary | ICD-10-CM | POA: Insufficient documentation

## 2018-02-16 LAB — BASIC METABOLIC PANEL
ANION GAP: 7 (ref 5–15)
BUN: 19 mg/dL (ref 8–23)
CHLORIDE: 104 mmol/L (ref 98–111)
CO2: 29 mmol/L (ref 22–32)
Calcium: 9.1 mg/dL (ref 8.9–10.3)
Creatinine, Ser: 1.21 mg/dL (ref 0.61–1.24)
GFR calc Af Amer: >60 mL/min (ref >60)
GFR, EST NON AFRICAN AMERICAN: 56 mL/min — AB (ref >60)
GLUCOSE: 112 mg/dL — AB (ref 70–99)
POTASSIUM: 3.6 mmol/L (ref 3.5–5.1)
Sodium: 140 mmol/L (ref 135–145)

## 2018-02-16 NOTE — Progress Notes (Signed)
EKG reviewed by Dr. Turk, will proceed with surgery as scheduled. 

## 2018-02-16 NOTE — Telephone Encounter (Signed)
Pt is scheduled to see Angie Duke PA-C on 02/17/18 I have notified requesting office of pt's appointment and assured them that clearance will be addressed at visit

## 2018-02-16 NOTE — Telephone Encounter (Signed)
   Primary Cardiologist:Peter Martinique, MD  Chart reviewed as part of pre-operative protocol coverage. Because of Jeffery Schmidt's past medical history and time since last visit, he/she will require a follow-up visit in order to better assess preoperative cardiovascular risk.  Pre-op covering staff: - Please schedule appointment and call patient to inform them. - Please contact requesting surgeon's office via preferred method (i.e, phone, fax) to inform them of need for appointment prior to surgery.  Lyda Jester, PA-C  02/16/2018, 3:12 PM

## 2018-02-16 NOTE — Progress Notes (Deleted)
Dr Redmond Baseman office notified that pt needs cardiac clearance and also instructions, if any, of patient's medications that need to be held and for how long. They said they will contact Dr Doug Sou office to obtain this and will fax [facility] the cardiac clearance. Dr Gifford Shave is okay for procedure to proceed as planned as long as patient has not had any chest pain in last year and can walk up a flight of stairs without being short of breath; patient denies having any chest pain in last year and has no shortness of breath going up a flight of stairs. Awaiting cardiac clearance.

## 2018-02-16 NOTE — Telephone Encounter (Signed)
New message     [facility] Pre-operative Risk Assessment    Request for surgical clearance:  1. What type of surgery is being performed? Drug Induced Sleep Endoscopy  2. When is this surgery scheduled? 02/22/2018  3. What type of clearance is required (medical clearance vs. Pharmacy clearance to hold med vs. Both)? both  4. Are there any medications that need to be held prior to surgery and how long?will leave up to cardiologist to determine  5. Practice name and name of physician performing surgery? [facility], Dr. Melida Quitter  6. What is your office phone number [phone]   7.   What is your office fax number [phone]  8.   Anesthesia type (None, local, MAC, general) ? general   Maryjane Hurter 02/16/2018, 1:59 PM  _________________________________________________________________   (provider comments below)

## 2018-02-16 NOTE — Progress Notes (Signed)
Waiting to hear from Dr Redmond Baseman office if patient to continue taking plavix and aspirin up until day of surgery.

## 2018-02-17 ENCOUNTER — Ambulatory Visit (INDEPENDENT_AMBULATORY_CARE_PROVIDER_SITE_OTHER): Payer: Medicare Other | Admitting: Physician Assistant

## 2018-02-17 ENCOUNTER — Encounter: Payer: Self-pay | Admitting: Physician Assistant

## 2018-02-17 VITALS — BP 120/72 | HR 55 | Ht 66.0 in | Wt 161.0 lb

## 2018-02-17 DIAGNOSIS — I2583 Coronary atherosclerosis due to lipid rich plaque: Secondary | ICD-10-CM

## 2018-02-17 DIAGNOSIS — I2510 Atherosclerotic heart disease of native coronary artery without angina pectoris: Secondary | ICD-10-CM

## 2018-02-17 DIAGNOSIS — Z01818 Encounter for other preprocedural examination: Secondary | ICD-10-CM

## 2018-02-17 DIAGNOSIS — E1169 Type 2 diabetes mellitus with other specified complication: Secondary | ICD-10-CM

## 2018-02-17 DIAGNOSIS — I10 Essential (primary) hypertension: Secondary | ICD-10-CM

## 2018-02-17 DIAGNOSIS — G4733 Obstructive sleep apnea (adult) (pediatric): Secondary | ICD-10-CM

## 2018-02-17 NOTE — Progress Notes (Signed)
Cardiology Office Note:    Date:  02/17/2018   ID:  Jeffery Schmidt, DOB [birth year], MRN [id number]  PCP:  Levin Erp, MD  Cardiologist:  Peter Martinique, MD   Referring MD: Levin Erp, MD   Chief Complaint  Patient presents with  . Medical Clearance    History of Present Illness:    Jeffery Schmidt is a 77 y.o. male with a hx of CAD, hypertension, OSA, and hyperlipidemia.  He had a bare-metal stent placed to the left circumflex in 1998 with subsequent stenting of the LAD and RCA in November 2015.  He has a history of diabetes type 2 and chronic diastolic dysfunction.  He was last seen by Ignacia Bayley on 06/30/2016 after some brief episodes of chest discomfort.  Lexiscan Myoview was ordered and showed no signs of reversible ischemia and normal EF.  No further work-up was performed at that time.  He was maintained on aspirin, beta-blocker, Plavix, ARB, statin, and Zetia.  He presents today for preoperative evaluation for endoscopy.  Provider also request holding aspirin +/-Plavix. His anginal equivalent is not frank chest pain. In 2015 he only had a "twinge." No SOB, chest pain, palpitations, lower swelling, and orthopnea.       Past Medical History:  Diagnosis Date  . Blood transfusion without reported diagnosis   . CAD (coronary artery disease)    a. 1998 s/p BMS to LCX;  b. 12/2012 MV: EF 68%, no ischemia;  c. 04/2014 Cath/PCI: LM nl, LAD 90p (3.0x18 Biotronik Orsiro DES - 3.5), LCX 25m, patent stent, RCA  90 (2.5x18 Biotronik Orsiro DES - 2.75)  . Diabetes mellitus without complication ([place])   . Diastolic dysfunction    a. 04/2014 Echo: EF 60-65%, no rwma, Gr1 DD, mild MR, mildly dil LA.  [name] History of kidney stones    had nephrostomy for this about 20 years ago  . HLD (hyperlipidemia)   . HTN (hypertension)   . Kidney stones   . Obstructive sleep apnea    a. on CPAP  . Sleep apnea     Past Surgical History:  Procedure Laterality Date  . BLEPHAROPLASTY Bilateral 2000's    . COLONOSCOPY    . CORONARY ANGIOPLASTY  1998  . CORONARY ANGIOPLASTY WITH STENT PLACEMENT  05/07/14   stents to LAD and RCA, patent LCX stent   . LEFT HEART CATHETERIZATION WITH CORONARY ANGIOGRAM N/A 05/07/2014   Procedure: LEFT HEART CATHETERIZATION WITH CORONARY ANGIOGRAM;  Surgeon: Peter M Martinique, MD;  Location: [facility] CATH LAB;  Service: Cardiovascular;  Laterality: N/A;  . LITHOTRIPSY  1990's  . NEPHROSTOMY  1990's  . PERCUTANEOUS CORONARY STENT INTERVENTION (PCI-S)  05/07/2014   Procedure: PERCUTANEOUS CORONARY STENT INTERVENTION (PCI-S);  Surgeon: Peter M Martinique, MD;  Location: [facility] CATH LAB;  Service: Cardiovascular;;  Drug eluting stent Prox LAD (3.0/18 Orsiro) and drug eluting stent to Mid RCA (2.5/18 Orsiro)    Current Medications: Current Meds  Medication Sig  . amLODipine (NORVASC) 10 MG tablet Take 10 mg by mouth daily.  . Ascorbic Acid (VITAMIN C PO) Take 1 tablet by mouth daily.  [name] aspirin 81 MG tablet Take 81 mg by mouth daily.  . cholecalciferol (VITAMIN D) 1000 units tablet Take 5,000 Units by mouth daily.   . clopidogrel (PLAVIX) 75 MG tablet Take 1 tablet (75 mg total) by mouth daily.  . Coenzyme Q10 (COQ10 PO) Take 300 mg by mouth daily.  [name] ezetimibe (ZETIA) 10 MG tablet Take 10 mg by mouth  daily.  . Glucos-Chondroit-Hyaluron-MSM (GLUCOSAMINE CHONDROITIN JOINT PO) Take 1 tablet by mouth daily.  [name] losartan-hydrochlorothiazide (HYZAAR) 100-25 MG per tablet Take 1 tablet by mouth daily.  . metFORMIN (GLUCOPHAGE) 500 MG tablet Take 500 mg daily  . metoprolol succinate (TOPROL-XL) 100 MG 24 hr tablet   . omega-3 acid ethyl esters (LOVAZA) 1 G capsule Take 1 g by mouth daily.  . pravastatin (PRAVACHOL) 20 MG tablet Take 10 mg by mouth daily.  . vitamin B-12 (CYANOCOBALAMIN) 100 MCG tablet Take 100 mcg by mouth daily.     Allergies:   Brilinta [ticagrelor] and Epinephrine   Social History   Socioeconomic History  . Marital status: Married    Spouse name: Jeffery Schmidt  .  Number of children: 6  . Years of education: Not on file  . Highest education level: Not on file  Occupational History  . Occupation: retired  Scientific laboratory technician  . Financial resource strain: Not on file  . Food insecurity:    Worry: Not on file    Inability: Not on file  . Transportation needs:    Medical: Not on file    Non-medical: Not on file  Tobacco Use  . Smoking status: Former Research scientist (life sciences)  . Smokeless tobacco: Never Used  . Tobacco comment: "smoked cigarettes a thousand years ago; maybe a cigarette qod for a time"  Substance and Sexual Activity  . Alcohol use: No  . Drug use: No  . Sexual activity: Not Currently  Lifestyle  . Physical activity:    Days per week: Not on file    Minutes per session: Not on file  . Stress: Not on file  Relationships  . Social connections:    Talks on phone: Not on file    Gets together: Not on file    Attends religious service: Not on file    Active member of club or organization: Not on file    Attends meetings of clubs or organizations: Not on file    Relationship status: Not on file  Other Topics Concern  . Not on file  Social History Narrative  . Not on file     Family History: The patient's family history includes Coronary artery disease in his unknown relative; Heart disease in his unknown relative; Hypertension in his mother and unknown relative; Other in his father; Stroke in his unknown relative.  ROS:   Please see the history of present illness.     All other systems reviewed and are negative.  EKGs/Labs/Other Studies Reviewed:    The following studies were reviewed today:  Myoview 07/02/16:  The left ventricular ejection fraction is normal (55-65%).  Nuclear stress EF: 60%.  There was no ST segment deviation noted during stress.  The study is normal.  This is a low risk study.   Normal resting and stress perfusion. No ischemia or infarction EF 60%   EKG:  EKG is not ordered today.    Recent Labs: 02/16/2018: BUN  19; Creatinine, Ser 1.21; Potassium 3.6; Sodium 140  Recent Lipid Panel    Component Value Date/Time   CHOL 173 05/09/2014 0244   TRIG 222 (H) 05/09/2014 0244   HDL 36 (L) 05/09/2014 0244   CHOLHDL 4.8 05/09/2014 0244   VLDL 44 (H) 05/09/2014 0244   LDLCALC 93 05/09/2014 0244    Physical Exam:    VS:  BP 120/72   Pulse (!) 55   Ht 5\' 6"  (1.676 m)   Wt 161 lb (73 kg)   SpO2  95%   BMI 25.99 kg/m     Wt Readings from Last 3 Encounters:  02/17/18 161 lb (73 kg)  04/21/17 162 lb (73.5 kg)  03/31/17 162 lb (73.5 kg)     GEN: Well nourished, well developed in no acute distress HEENT: Normal NECK: No JVD; No carotid bruits CARDIAC: RRR, no murmurs, rubs, gallops RESPIRATORY:  Clear to auscultation without rales, wheezing or rhonchi  ABDOMEN: Soft, non-tender, non-distended MUSCULOSKELETAL:  trace edema; No deformity  SKIN: Warm and dry NEUROLOGIC:  Alert and oriented x 3 PSYCHIATRIC:  Normal affect   ASSESSMENT:    1. Preoperative clearance   2. Coronary artery disease due to lipid rich plaque   3. Essential hypertension   4. Obstructive sleep apnea   5. Type 2 diabetes mellitus with other specified complication, without long-term current use of insulin (HCC)    PLAN:    In order of problems listed above:  Preoperative clearance He was revascularized in 2015 and has had a negative Myoview in January 2018 that is reassuring.  He reports no anginal symptoms, including his anginal equivalent.  His creatinine is below 1.5 and he does not take insulin.  We discussed that he is at a higher risk for comp occasions during surgery but that the benefits of surgery will outweigh the risks.  He needs no further cardiac evaluation prior to surgery.  He is cleared to proceed with surgery.  He may hold Plavix 5 days prior to surgery.  He states that he is scheduled for next Wednesday.  He took Plavix today, I instructed him to not take Plavix until the day after surgery.  Coronary  artery disease due to lipid rich plaque Myoview in January 2018 was negative for reversible ischemia with normal LVEF.  He denies anginal symptoms including his anginal equivalent.  Stable, no medication changes.  Essential hypertension Pressures well controlled, no medication changes.  Continue 10 mg of Norvasc, losartan-HCTZ, metoprolol succinate 100 mg daily.  Obstructive sleep apnea Unable to tolerate CPAP.  He is undergoing surgical evaluation for a new OSA therapy.  Per ENT.  Type 2 diabetes mellitus with other specified complication, without long-term current use of insulin ([place]) Per primary.  Keep his follow-up scheduled with Dr. Martinique on November 4.  Medication Adjustments/Labs and Tests Ordered: Current medicines are reviewed at length with the patient today.  Concerns regarding medicines are outlined above.  No orders of the defined types were placed in this encounter.  No orders of the defined types were placed in this encounter.   Signed, Ledora Bottcher, PA  02/17/2018 1:55 PM    [facility]

## 2018-02-17 NOTE — Patient Instructions (Addendum)
Medication Instructions:  No changes. If you need a refill on your cardiac medications before your next appointment, please call your pharmacy.  Labwork: None Ordered.  Testing/Procedures: None Ordered.  Follow-Up: Your physician wants you to follow-up as scheduled with Dr.Jordan.   Thank you for choosing [facility] at [facility]!!

## 2018-02-22 ENCOUNTER — Ambulatory Visit (HOSPITAL_BASED_OUTPATIENT_CLINIC_OR_DEPARTMENT_OTHER): Payer: Medicare Other | Admitting: Certified Registered"

## 2018-02-22 ENCOUNTER — Ambulatory Visit (HOSPITAL_BASED_OUTPATIENT_CLINIC_OR_DEPARTMENT_OTHER)
Admission: RE | Admit: 2018-02-22 | Discharge: 2018-02-22 | Disposition: A | Discharge status: Home / Self Care | Payer: Medicare Other | Source: Ambulatory Visit | Attending: Otolaryngology | Admitting: Otolaryngology

## 2018-02-22 ENCOUNTER — Other Ambulatory Visit: Payer: Self-pay

## 2018-02-22 ENCOUNTER — Encounter (HOSPITAL_BASED_OUTPATIENT_CLINIC_OR_DEPARTMENT_OTHER): Payer: Self-pay | Admitting: Certified Registered"

## 2018-02-22 ENCOUNTER — Encounter (HOSPITAL_BASED_OUTPATIENT_CLINIC_OR_DEPARTMENT_OTHER)
Admission: RE | Disposition: A | Discharge status: Home / Self Care | Payer: Self-pay | Source: Ambulatory Visit | Attending: Otolaryngology

## 2018-02-22 DIAGNOSIS — G4733 Obstructive sleep apnea (adult) (pediatric): Secondary | ICD-10-CM | POA: Insufficient documentation

## 2018-02-22 DIAGNOSIS — E785 Hyperlipidemia, unspecified: Secondary | ICD-10-CM | POA: Diagnosis not present

## 2018-02-22 DIAGNOSIS — E119 Type 2 diabetes mellitus without complications: Secondary | ICD-10-CM | POA: Diagnosis not present

## 2018-02-22 DIAGNOSIS — Z7902 Long term (current) use of antithrombotics/antiplatelets: Secondary | ICD-10-CM | POA: Insufficient documentation

## 2018-02-22 DIAGNOSIS — Z888 Allergy status to other drugs, medicaments and biological substances status: Secondary | ICD-10-CM | POA: Insufficient documentation

## 2018-02-22 DIAGNOSIS — I2510 Atherosclerotic heart disease of native coronary artery without angina pectoris: Secondary | ICD-10-CM | POA: Insufficient documentation

## 2018-02-22 DIAGNOSIS — Z79899 Other long term (current) drug therapy: Secondary | ICD-10-CM | POA: Insufficient documentation

## 2018-02-22 DIAGNOSIS — Z87891 Personal history of nicotine dependence: Secondary | ICD-10-CM | POA: Insufficient documentation

## 2018-02-22 DIAGNOSIS — Z7982 Long term (current) use of aspirin: Secondary | ICD-10-CM | POA: Insufficient documentation

## 2018-02-22 DIAGNOSIS — Z7984 Long term (current) use of oral hypoglycemic drugs: Secondary | ICD-10-CM | POA: Diagnosis not present

## 2018-02-22 DIAGNOSIS — Z955 Presence of coronary angioplasty implant and graft: Secondary | ICD-10-CM | POA: Insufficient documentation

## 2018-02-22 DIAGNOSIS — I10 Essential (primary) hypertension: Secondary | ICD-10-CM | POA: Insufficient documentation

## 2018-02-22 HISTORY — PX: DRUG INDUCED ENDOSCOPY: SHX6808

## 2018-02-22 HISTORY — DX: Adverse effect of unspecified anesthetic, initial encounter: T41.45XA

## 2018-02-22 HISTORY — DX: Other complications of anesthesia, initial encounter: T88.59XA

## 2018-02-22 HISTORY — DX: Personal history of urinary calculi: Z87.442

## 2018-02-22 LAB — GLUCOSE, CAPILLARY
Glucose-Capillary: 132 mg/dL — ABNORMAL HIGH (ref 70–99)
Glucose-Capillary: 115 mg/dL — ABNORMAL HIGH (ref 70–99)

## 2018-02-22 SURGERY — DRUG INDUCED SLEEP ENDOSCOPY
Anesthesia: Monitor Anesthesia Care

## 2018-02-22 MED ORDER — SCOPOLAMINE 1 MG/3DAYS TD PT72: 1.0000 | MEDICATED_PATCH | Freq: Once | TRANSDERMAL | Status: DC | PRN

## 2018-02-22 MED ORDER — OXYMETAZOLINE HCL 0.05 % NA SOLN
NASAL | Status: AC
  Filled 2018-02-22: qty 15

## 2018-02-22 MED ORDER — ONDANSETRON HCL 4 MG/2ML IJ SOLN: 4.0000 mg | Freq: Once | INTRAMUSCULAR | Status: DC | PRN

## 2018-02-22 MED ORDER — OXYMETAZOLINE HCL 0.05 % NA SOLN
NASAL | Status: DC | PRN
  Administered 2018-02-22: 1 via TOPICAL

## 2018-02-22 MED ORDER — PROPOFOL 500 MG/50ML IV EMUL
INTRAVENOUS | Status: DC | PRN
  Administered 2018-02-22: 35 ug/kg/min via INTRAVENOUS

## 2018-02-22 MED ORDER — MIDAZOLAM HCL 2 MG/2ML IJ SOLN: 1.0000 mg | INTRAMUSCULAR | Status: DC | PRN

## 2018-02-22 MED ORDER — ONDANSETRON HCL 4 MG/2ML IJ SOLN
INTRAMUSCULAR | Status: DC | PRN
  Administered 2018-02-22: 4 mg via INTRAVENOUS

## 2018-02-22 MED ORDER — FENTANYL CITRATE (PF) 100 MCG/2ML IJ SOLN: 25.0000 ug | INTRAMUSCULAR | Status: DC | PRN

## 2018-02-22 MED ORDER — PROPOFOL 10 MG/ML IV BOLUS
INTRAVENOUS | Status: DC | PRN
  Administered 2018-02-22 (×2): 10 mg via INTRAVENOUS

## 2018-02-22 MED ORDER — FENTANYL CITRATE (PF) 100 MCG/2ML IJ SOLN: 50.0000 ug | INTRAMUSCULAR | Status: DC | PRN

## 2018-02-22 MED ORDER — LACTATED RINGERS IV SOLN
INTRAVENOUS | Status: DC
  Administered 2018-02-22: 09:00:00 via INTRAVENOUS

## 2018-02-22 MED ORDER — LIDOCAINE HCL (CARDIAC) PF 100 MG/5ML IV SOSY
PREFILLED_SYRINGE | INTRAVENOUS | Status: DC | PRN
  Administered 2018-02-22: 30 mg via INTRAVENOUS

## 2018-02-22 SURGICAL SUPPLY — 11 items
CANISTER SUCT 1200ML W/VALVE (MISCELLANEOUS) ×3 IMPLANT
GLOVE BIO SURGEON STRL SZ7.5 (GLOVE) ×3 IMPLANT
NEEDLE PRECISIONGLIDE 27X1.5 (NEEDLE) IMPLANT
PACK BASIN DAY SURGERY FS (CUSTOM PROCEDURE TRAY) ×3 IMPLANT
PATTIES SURGICAL .5 X3 (DISPOSABLE) ×3 IMPLANT
SHEET MEDIUM DRAPE 40X70 STRL (DRAPES) IMPLANT
SOLUTION ANTI FOG 6CC (MISCELLANEOUS) ×3 IMPLANT
SYR CONTROL 10ML LL (SYRINGE) IMPLANT
TOWEL GREEN STERILE FF (TOWEL DISPOSABLE) ×3 IMPLANT
TUBE CONNECTING 20'X1/4 (TUBING) ×1
TUBE CONNECTING 20X1/4 (TUBING) ×2 IMPLANT

## 2018-02-22 NOTE — Anesthesia Procedure Notes (Signed)
Procedure Name: MAC Date/Time: 02/22/2018 9:45 AM Performed by: Signe Colt, CRNA Pre-anesthesia Checklist: Patient identified, Emergency Drugs available, Suction available, Patient being monitored and Timeout performed Patient Re-evaluated:Patient Re-evaluated prior to induction Oxygen Delivery Method: Simple face mask

## 2018-02-22 NOTE — Op Note (Signed)
NAME: Jeffery Schmidt, Jeffery Schmidt MEDICAL RECORD [id number] ACCOUNT [id number] DATE OF BIRTH:17-Aug-1940 FACILITY: [facility] LOCATION: [facility] PHYSICIAN:Chanceler Pullin Guido Sander, MD  OPERATIVE REPORT  DATE OF PROCEDURE:  02/22/2018  PREOPERATIVE DIAGNOSIS:  Obstructive sleep apnea.  POSTOPERATIVE DIAGNOSIS:  Obstructive sleep apnea.  PROCEDURE:  Drug-induced sleep endoscopy.  SURGEON:  Melida Quitter, MD  ANESTHESIA:  IV sedation.  COMPLICATIONS:  None.  INDICATIONS:  The patient is a 77 year old male with moderate obstructive sleep apnea with an AHI of 23.4, who has been unable to tolerate CPAP.  He presents to the operating room for airway evaluation.  FINDINGS:  Upon sleep endoscopy, the patient's airway collapsed primarily at the velopharynx with a 90% anterior-posterior collapse and 10% lateral wall collapse.  The tongue base did not collapse very much, but to the degree that it did, it was an  anterior-posterior direction.  DESCRIPTION OF PROCEDURE:  The patient was identified in the holding room, informed consent having been obtained with discussion of risks, benefits, and alternatives, the patient was brought to the operative suite to the operating room and placed in the  supine position.  IV sedation was given and the patient's level of sedation was adjusted to estimate a natural level of sleep.  Afrin pledgets were placed in the right side of the nose and removed after a few minutes.  When the patient was sleeping and  beginning to obstruct, a flexible laryngoscope was passed through the right nasal passage to view the nasopharynx and pharynx and larynx.  Findings are noted above.  After this was completed, the scope was removed.  The patient was returned to anesthesia  for wakeup.    He was moved to the recovery room in stable condition.  AN/NUANCE  D:02/22/2018 T:02/22/2018 JOB:[id number]

## 2018-02-22 NOTE — H&P (Signed)
Jeffery Schmidt is an 77 y.o. male.   Chief Complaint: Sleep apnea HPI: 77 year old male with obstructive sleep apnea who has been unable to tolerate CPAP.  He presents for drug-induced sleep endoscopy.  Past Medical History:  Diagnosis Date  . Blood transfusion without reported diagnosis   . CAD (coronary artery disease)    a. 1998 s/p BMS to LCX;  b. 12/2012 MV: EF 68%, no ischemia;  c. 04/2014 Cath/PCI: LM nl, LAD 90p (3.0x18 Biotronik Orsiro DES - 3.5), LCX 73m, patent stent, RCA  90 (2.5x18 Biotronik Orsiro DES - 2.75)  . Complication of anesthesia    anesthesia delirium after heart cath   . Diabetes mellitus without complication ([place])   . Diastolic dysfunction    a. 04/2014 Echo: EF 60-65%, no rwma, Gr1 DD, mild MR, mildly dil LA.  [name] History of kidney stones    had nephrostomy for this about 20 years ago  . HLD (hyperlipidemia)   . HTN (hypertension)   . Kidney stones   . Obstructive sleep apnea    a. on CPAP  . Sleep apnea     Past Surgical History:  Procedure Laterality Date  . BLEPHAROPLASTY Bilateral 2000's  . COLONOSCOPY    . CORONARY ANGIOPLASTY  1998  . CORONARY ANGIOPLASTY WITH STENT PLACEMENT  05/07/14   stents to LAD and RCA, patent LCX stent   . LEFT HEART CATHETERIZATION WITH CORONARY ANGIOGRAM N/A 05/07/2014   Procedure: LEFT HEART CATHETERIZATION WITH CORONARY ANGIOGRAM;  Surgeon: Peter M Martinique, MD;  Location: [facility] C[facility] LAB;  Service: Cardiovascular;  Laterality: N/A;  . LITHOTRIPSY  1990's  . NEPHROSTOMY  1990's  . PERCUTANEOUS CORONARY STENT INTERVENTION (PCI-S)  05/07/2014   Procedure: PERCUTANEOUS CORONARY STENT INTERVENTION (PCI-S);  Surgeon: Peter M Martinique, MD;  Location: [facility] CATH LAB;  Service: Cardiovascular;;  Drug eluting stent Prox LAD (3.0/18 Orsiro) and drug eluting stent to Mid RCA (2.5/18 Orsiro)    Family History  Problem Relation Age of Onset  . Hypertension Mother   . Coronary artery disease Unknown   . Heart disease Unknown   .  Hypertension Unknown   . Stroke Unknown   . Other Father        unknown   Social History:  reports that he has quit smoking. He has never used smokeless tobacco. He reports that he does not drink alcohol or use drugs.  Allergies:  Allergies  Allergen Reactions  . Brilinta [Ticagrelor] Shortness Of Breath  . Epinephrine Anaphylaxis    Pt states he is anaphyactic    Medications Prior to Admission  Medication Sig Dispense Refill  . amLODipine (NORVASC) 10 MG tablet Take 10 mg by mouth daily.    . Ascorbic Acid (VITAMIN C PO) Take 1 tablet by mouth daily.    [name] aspirin 81 MG tablet Take 81 mg by mouth daily.    . cholecalciferol (VITAMIN D) 1000 units tablet Take 5,000 Units by mouth daily.     . clopidogrel (PLAVIX) 75 MG tablet Take 1 tablet (75 mg total) by mouth daily. 30 tablet 11  . Coenzyme Q10 (COQ10 PO) Take 300 mg by mouth daily.    [name] ezetimibe (ZETIA) 10 MG tablet Take 10 mg by mouth daily.    . Glucos-Chondroit-Hyaluron-MSM (GLUCOSAMINE CHONDROITIN JOINT PO) Take 1 tablet by mouth daily.    [name] losartan-hydrochlorothiazide (HYZAAR) 100-25 MG per tablet Take 1 tablet by mouth daily.    . metFORMIN (GLUCOPHAGE) 500 MG tablet Take 500  mg daily    . metoprolol succinate (TOPROL-XL) 100 MG 24 hr tablet     . omega-3 acid ethyl esters (LOVAZA) 1 G capsule Take 1 g by mouth daily.    . pravastatin (PRAVACHOL) 20 MG tablet Take 10 mg by mouth daily.    . vitamin B-12 (CYANOCOBALAMIN) 100 MCG tablet Take 100 mcg by mouth daily.      No results found for this or any previous visit (from the past 48 hour(s)). No results found.  Review of Systems  All other systems reviewed and are negative.   Blood pressure (!) 146/74, pulse (!) 50, temperature 97.7 F (36.5 C), temperature source Oral, resp. rate 16, height 5\' 6"  (1.676 m), weight 73 kg, SpO2 99 %. Physical Exam  Constitutional: He is oriented to person, place, and time. He appears well-developed and well-nourished. No distress.   HENT:  Head: Normocephalic and atraumatic.  Right Ear: External ear normal.  Left Ear: External ear normal.  Nose: Nose normal.  Mouth/Throat: Oropharynx is clear and moist.  Eyes: Pupils are equal, round, and reactive to light. Conjunctivae and EOM are normal.  Neck: Normal range of motion. Neck supple.  Cardiovascular: Normal rate.  Respiratory: Effort normal.  Musculoskeletal: Normal range of motion.  Neurological: He is alert and oriented to person, place, and time. No cranial nerve deficit.  Skin: Skin is warm and dry.  Psychiatric: He has a normal mood and affect. His behavior is normal. Judgment and thought content normal.     Assessment/Plan Obstructive sleep apnea To OR for drug-induced sleep endoscopy.  Melida Quitter, MD 02/22/2018, 9:13 AM

## 2018-02-22 NOTE — Anesthesia Postprocedure Evaluation (Signed)
Anesthesia Post Note  Patient: Jeffery Schmidt  Procedure(s) Performed: DRUG INDUCED ENDOSCOPY (N/A )     Patient location during evaluation: PACU Anesthesia Type: MAC Level of consciousness: awake and alert, awake and oriented Pain management: pain level controlled Vital Signs Assessment: post-procedure vital signs reviewed and stable Respiratory status: spontaneous breathing, nonlabored ventilation and respiratory function stable Cardiovascular status: stable and blood pressure returned to baseline Postop Assessment: no apparent nausea or vomiting Anesthetic complications: no    Last Vitals:  Vitals:   02/22/18 1030 02/22/18 1100  BP: 128/69 (!) 148/69  Pulse: (!) 51 (!) 51  Resp: (!) 21 18  Temp:  (!) 36.4 C  SpO2: 93% 98%    Last Pain:  Vitals:   02/22/18 1100  TempSrc:   PainSc: 0-No pain                 Catalina Gravel

## 2018-02-22 NOTE — Anesthesia Preprocedure Evaluation (Addendum)
Anesthesia Evaluation  Patient identified by MRN, date of birth, ID band Patient awake    Reviewed: Allergy & Precautions, NPO status , Patient's Chart, lab work & pertinent test results, reviewed documented beta blocker date and time   History of Anesthesia Complications (+) history of anesthetic complications (Postop delirium)  Airway Mallampati: II  TM Distance: >3 FB Neck ROM: Full    Dental  (+) Teeth Intact, Dental Advisory Given, Caps,    Pulmonary sleep apnea , former smoker,    Pulmonary exam normal breath sounds clear to auscultation       Cardiovascular hypertension, Pt. on medications and Pt. on home beta blockers (-) angina+ CAD and + Cardiac Stents  (-) Past MI Normal cardiovascular exam Rhythm:Regular Rate:Normal     Neuro/Psych negative neurological ROS  negative psych ROS   GI/Hepatic negative GI ROS, Neg liver ROS,   Endo/Other  diabetes, Type 2, Oral Hypoglycemic Agents  Renal/GU Renal disease     Musculoskeletal negative musculoskeletal ROS (+)   Abdominal   Peds  Hematology  (+) Blood dyscrasia (Plavix), ,   Anesthesia Other Findings Day of surgery medications reviewed with the patient.  Reproductive/Obstetrics                            Anesthesia Physical Anesthesia Plan  ASA: III  Anesthesia Plan: MAC   Post-op Pain Management:    Induction: Intravenous  PONV Risk Score and Plan: 2 and Propofol infusion  Airway Management Planned: Natural Airway and Nasal Cannula  Additional Equipment:   Intra-op Plan:   Post-operative Plan:   Informed Consent: I have reviewed the patients History and Physical, chart, labs and discussed the procedure including the risks, benefits and alternatives for the proposed anesthesia with the patient or authorized representative who has indicated his/her understanding and acceptance.   Dental advisory given  Plan Discussed  with: CRNA  Anesthesia Plan Comments:        Anesthesia Quick Evaluation

## 2018-02-22 NOTE — Brief Op Note (Signed)
02/22/2018  10:18 AM  PATIENT:  Odella Aquas Todaro  77 y.o. male  PRE-OPERATIVE DIAGNOSIS:  obstructive sleep apnea  POST-OPERATIVE DIAGNOSIS:  obstructive sleep apnea  PROCEDURE:  Procedure(s): DRUG INDUCED ENDOSCOPY (N/A)  SURGEON:  Surgeon(s) and Role:    Melida Quitter, MD - Primary  PHYSICIAN ASSISTANT:   ASSISTANTS: none   ANESTHESIA:   IV sedation  EBL:  None  BLOOD ADMINISTERED:none  DRAINS: none   LOCAL MEDICATIONS USED:  NONE  SPECIMEN:  No Specimen  DISPOSITION OF SPECIMEN:  N/A  COUNTS:  YES  TOURNIQUET:  * No tourniquets in log *  DICTATION: .Other Dictation: Dictation Number [id number]  PLAN OF CARE: Discharge to home after PACU  PATIENT DISPOSITION:  PACU - hemodynamically stable.   Delay start of Pharmacological VTE agent (>24hrs) due to surgical blood loss or risk of bleeding: no

## 2018-02-22 NOTE — Discharge Instructions (Signed)
°  Post Anesthesia Home Care Instructions  Activity: Get plenty of rest for the remainder of the day. A responsible individual must stay with you for 24 hours following the procedure.  For the next 24 hours, DO NOT: -Drive a car -Operate machinery -Drink alcoholic beverages -Take any medication unless instructed by your physician -Make any legal decisions or sign important papers.  Meals: Start with liquid foods such as gelatin or soup. Progress to regular foods as tolerated. Avoid greasy, spicy, heavy foods. If nausea and/or vomiting occur, drink only clear liquids until the nausea and/or vomiting subsides. Call your physician if vomiting continues.  Special Instructions/Symptoms: Your throat may feel dry or sore from the anesthesia or the breathing tube placed in your throat during surgery. If this causes discomfort, gargle with warm salt water. The discomfort should disappear within 24 hours.  If you had a scopolamine patch placed behind your ear for the management of post- operative nausea and/or vomiting:  1. The medication in the patch is effective for 72 hours, after which it should be removed.  Wrap patch in a tissue and discard in the trash. Wash hands thoroughly with soap and water. 2. You may remove the patch earlier than 72 hours if you experience unpleasant side effects which may include dry mouth, dizziness or visual disturbances. 3. Avoid touching the patch. Wash your hands with soap and water after contact with the patch.    Post Anesthesia Home Care Instructions  Activity: Get plenty of rest for the remainder of the day. A responsible individual must stay with you for 24 hours following the procedure.  For the next 24 hours, DO NOT: -Drive a car -Operate machinery -Drink alcoholic beverages -Take any medication unless instructed by your physician -Make any legal decisions or sign important papers.  Meals: Start with liquid foods such as gelatin or soup. Progress to  regular foods as tolerated. Avoid greasy, spicy, heavy foods. If nausea and/or vomiting occur, drink only clear liquids until the nausea and/or vomiting subsides. Call your physician if vomiting continues.  Special Instructions/Symptoms: Your throat may feel dry or sore from the anesthesia or the breathing tube placed in your throat during surgery. If this causes discomfort, gargle with warm salt water. The discomfort should disappear within 24 hours.  If you had a scopolamine patch placed behind your ear for the management of post- operative nausea and/or vomiting:  1. The medication in the patch is effective for 72 hours, after which it should be removed.  Wrap patch in a tissue and discard in the trash. Wash hands thoroughly with soap and water. 2. You may remove the patch earlier than 72 hours if you experience unpleasant side effects which may include dry mouth, dizziness or visual disturbances. 3. Avoid touching the patch. Wash your hands with soap and water after contact with the patch.    

## 2018-02-22 NOTE — Transfer of Care (Signed)
Immediate Anesthesia Transfer of Care Note  Patient: Jeffery Schmidt  Procedure(s) Performed: DRUG INDUCED ENDOSCOPY (N/A )  Patient Location: PACU  Anesthesia Type:MAC  Level of Consciousness: awake, alert , oriented and patient cooperative  Airway & Oxygen Therapy: Patient Spontanous Breathing  Post-op Assessment: Report given to RN and Post -op Vital signs reviewed and stable  Post vital signs: Reviewed and stable  Last Vitals:  Vitals Value Taken Time  BP    Temp    Pulse 51 02/22/2018 10:14 AM  Resp 14 02/22/2018 10:14 AM  SpO2 94 % 02/22/2018 10:14 AM  Vitals shown include unvalidated device data.  Last Pain:  Vitals:   02/22/18 0859  TempSrc: Oral  PainSc: 0-No pain         Complications: No apparent anesthesia complications

## 2018-02-23 ENCOUNTER — Encounter (HOSPITAL_BASED_OUTPATIENT_CLINIC_OR_DEPARTMENT_OTHER): Payer: Self-pay | Admitting: Otolaryngology

## 2018-03-17 ENCOUNTER — Encounter: Payer: Self-pay | Admitting: *Deleted

## 2018-03-17 DIAGNOSIS — Z006 Encounter for examination for normal comparison and control in clinical research program: Secondary | ICD-10-CM

## 2018-03-17 NOTE — Research (Signed)
BIOFLOW V Research study year 4 telephone follow up completed. Patient denies any Adverse events or medication changes. He denies chest pain. Next research study required visit will be due between 15/aug/2020-13/dec/2020.

## 2018-04-05 ENCOUNTER — Other Ambulatory Visit: Payer: Self-pay | Admitting: Otolaryngology

## 2018-04-21 NOTE — Progress Notes (Signed)
Cardiology Office Note:    Date:  05/01/2018   ID:  Jeffery Schmidt, DOB [birth year], MRN [id number]  PCP:  Levin Erp, MD  Cardiologist:  Peter Martinique, MD   Referring MD: Levin Erp, MD   Chief Complaint  Patient presents with  . Coronary Artery Disease    History of Present Illness:    Jeffery Schmidt is a 77 y.o. male with a hx of CAD, hypertension, OSA, and hyperlipidemia.  He had a bare-metal stent placed to the left circumflex in 1998 with subsequent stenting of the LAD and RCA in November 2015.  He has a history of diabetes type 2 and chronic diastolic dysfunction.  He was  seen  on 06/30/2016 after some brief episodes of chest discomfort.  Lexiscan Myoview was ordered and showed no signs of reversible ischemia and normal EF.  No further work-up was performed at that time.  He was maintained on aspirin, beta-blocker, Plavix, ARB, statin, and Zetia.  He was seen by Sunday Spillers in August for pre op clearance for sleep induced endoscopy per ENT for OSA.[name] Plavix was held.   On follow up today he is doing very well. He denies any chest pain, dyspnea, palpitations. He is fatigued and relates this to his sleep apnea. Planning to have an Inspire device implanted by ENT.       Past Medical History:  Diagnosis Date  . Blood transfusion without reported diagnosis   . CAD (coronary artery disease)    a. 1998 s/p BMS to LCX;  b. 12/2012 MV: EF 68%, no ischemia;  c. 04/2014 Cath/PCI: LM nl, LAD 90p (3.0x18 Biotronik Orsiro DES - 3.5), LCX 65m, patent stent, RCA  90 (2.5x18 Biotronik Orsiro DES - 2.75)  . Complication of anesthesia    anesthesia delirium after heart cath   . Diabetes mellitus without complication ([place])   . Diastolic dysfunction    a. 04/2014 Echo: EF 60-65%, no rwma, Gr1 DD, mild MR, mildly dil LA.  [name] History of kidney stones    had nephrostomy for this about 20 years ago  . HLD (hyperlipidemia)   . HTN (hypertension)   . Kidney stones   . Obstructive sleep  apnea    a. on CPAP  . Sleep apnea     Past Surgical History:  Procedure Laterality Date  . BLEPHAROPLASTY Bilateral 2000's  . COLONOSCOPY    . CORONARY ANGIOPLASTY  1998  . CORONARY ANGIOPLASTY WITH STENT PLACEMENT  05/07/14   stents to LAD and RCA, patent LCX stent   . DRUG INDUCED ENDOSCOPY N/A 02/22/2018   Procedure: DRUG INDUCED ENDOSCOPY;  Surgeon: Melida Quitter, MD;  Location: [place];  Service: ENT;  Laterality: N/A;  . LEFT HEART CATHETERIZATION WITH CORONARY ANGIOGRAM N/A 05/07/2014   Procedure: LEFT HEART CATHETERIZATION WITH CORONARY ANGIOGRAM;  Surgeon: Peter M Martinique, MD;  Location: [facility] CATH LAB;  Service: Cardiovascular;  Laterality: N/A;  . LITHOTRIPSY  1990's  . NEPHROSTOMY  1990's  . PERCUTANEOUS CORONARY STENT INTERVENTION (PCI-S)  05/07/2014   Procedure: PERCUTANEOUS CORONARY STENT INTERVENTION (PCI-S);  Surgeon: Peter M Martinique, MD;  Location: [facility] CATH LAB;  Service: Cardiovascular;;  Drug eluting stent Prox LAD (3.0/18 Orsiro) and drug eluting stent to Mid RCA (2.5/18 Orsiro)    Current Medications: Current Meds  Medication Sig  . amLODipine (NORVASC) 10 MG tablet Take 10 mg by mouth daily.  . Ascorbic Acid (VITAMIN C PO) Take 1 tablet by mouth daily.  [name] aspirin  81 MG tablet Take 81 mg by mouth daily.  . cholecalciferol (VITAMIN D) 1000 units tablet Take 5,000 Units by mouth daily.   . clopidogrel (PLAVIX) 75 MG tablet Take 1 tablet (75 mg total) by mouth daily.  . Coenzyme Q10 (COQ10 PO) Take 300 mg by mouth daily.  [name] ezetimibe (ZETIA) 10 MG tablet Take 10 mg by mouth daily.  . Glucos-Chondroit-Hyaluron-MSM (GLUCOSAMINE CHONDROITIN JOINT PO) Take 1 tablet by mouth daily.  [name] losartan-hydrochlorothiazide (HYZAAR) 100-25 MG per tablet Take 1 tablet by mouth daily.  . metFORMIN (GLUCOPHAGE) 500 MG tablet Take 500 mg daily  . metoprolol succinate (TOPROL-XL) 100 MG 24 hr tablet   . omega-3 acid ethyl esters (LOVAZA) 1 G capsule Take 1 g by mouth  daily.  . pravastatin (PRAVACHOL) 20 MG tablet Take 10 mg by mouth daily.  . vitamin B-12 (CYANOCOBALAMIN) 100 MCG tablet Take 100 mcg by mouth daily.     Allergies:   Brilinta [ticagrelor] and Epinephrine   Social History   Socioeconomic History  . Marital status: Married    Spouse name: [name]  . Number of children: 6  . Years of education: Not on file  . Highest education level: Not on file  Occupational History  . Occupation: retired  Scientific laboratory technician  . Financial resource strain: Not on file  . Food insecurity:    Worry: Not on file    Inability: Not on file  . Transportation needs:    Medical: Not on file    Non-medical: Not on file  Tobacco Use  . Smoking status: Former Research scientist (life sciences)  . Smokeless tobacco: Never Used  . Tobacco comment: "smoked cigarettes a thousand years ago; maybe a cigarette qod for a time"  Substance and Sexual Activity  . Alcohol use: No  . Drug use: No  . Sexual activity: Not Currently  Lifestyle  . Physical activity:    Days per week: Not on file    Minutes per session: Not on file  . Stress: Not on file  Relationships  . Social connections:    Talks on phone: Not on file    Gets together: Not on file    Attends religious service: Not on file    Active member of club or organization: Not on file    Attends meetings of clubs or organizations: Not on file    Relationship status: Not on file  Other Topics Concern  . Not on file  Social History Narrative  . Not on file     Family History: The patient's family history includes Coronary artery disease in his unknown relative; Heart disease in his unknown relative; Hypertension in his mother and unknown relative; Other in his father; Stroke in his unknown relative.  ROS:   Please see the history of present illness.     All other systems reviewed and are negative.  EKGs/Labs/Other Studies Reviewed:    The following studies were reviewed today:  Myoview 07/02/16:  The left ventricular ejection  fraction is normal (55-65%).  Nuclear stress EF: 60%.  There was no ST segment deviation noted during stress.  The study is normal.  This is a low risk study.   Normal resting and stress perfusion. No ischemia or infarction EF 60%   EKG:  EKG is not ordered today.    Recent Labs: 02/16/2018: BUN 19; Creatinine, Ser 1.21; Potassium 3.6; Sodium 140  Recent Lipid Panel    Component Value Date/Time   CHOL 173 05/09/2014 0244   TRIG  222 (H) 05/09/2014 0244   HDL 36 (L) 05/09/2014 0244   CHOLHDL 4.8 05/09/2014 0244   VLDL 44 (H) 05/09/2014 0244   LDLCALC 93 05/09/2014 0244  dated 03/31/18: cholesterol 175, triglycerides 167, HDL 53, LDL 95. A1c 6.1%. Hgb and chemistries normal.   Physical Exam:    VS:  BP (!) 141/73   Pulse (!) 53   Ht 5\' 6"  (1.676 m)   Wt 161 lb 4.8 oz (73.2 kg)   BMI 26.03 kg/m     Wt Readings from Last 3 Encounters:  05/01/18 161 lb 4.8 oz (73.2 kg)  02/22/18 161 lb (73 kg)  02/17/18 161 lb (73 kg)    GENERAL:  Well appearing WM in NAD HEENT:  PERRL, EOMI, sclera are clear. Oropharynx is clear. NECK:  No jugular venous distention, carotid upstroke brisk and symmetric, no bruits, no thyromegaly or adenopathy LUNGS:  Clear to auscultation bilaterally CHEST:  Unremarkable HEART:  RRR,  PMI not displaced or sustained,S1 and S2 within normal limits, no S3, no S4: no clicks, no rubs, no murmurs ABD:  Soft, nontender. BS +, no masses or bruits. No hepatomegaly, no splenomegaly EXT:  2 + pulses throughout, no edema, no cyanosis no clubbing SKIN:  Warm and dry.  No rashes NEURO:  Alert and oriented x 3. Cranial nerves II through XII intact. PSYCH:  Cognitively intact    ASSESSMENT:    1. Coronary artery disease due to lipid rich plaque   2. Essential hypertension   3. Type 2 diabetes mellitus with other specified complication, without long-term current use of insulin (HCC)    PLAN:    In order of problems listed above:  1. Coronary artery disease  with stable angina Myoview in January 2018 was negative for reversible ischemia with normal LVEF.  He is asymptomatic. Continue current therapy and follow up in one year.     2. Essential hypertension BP is well controlled, no medication changes.    3. Obstructive sleep apnea Unable to tolerate CPAP or dental appliance.  He is planning to have an Inspire device implanted per ENT.OK to hold Plavix 5 days for the procedure.  4. Type 2 diabetes mellitus with other specified complication, without long-term current use of insulin ([place]) Per primary.  5. Hypercholesterolemia. Intolerant of high dose statin in the past. On Zetia, pravastatin, Lovaza. LDL 95 with goal of 70. Could consider PCSK 9 inhibitor. Will defer to Dr. Nyoka Cowden.  Follow up in one  Year.  Medication Adjustments/Labs and Tests Ordered: Current medicines are reviewed at length with the patient today.  Concerns regarding medicines are outlined above.  No orders of the defined types were placed in this encounter.  No orders of the defined types were placed in this encounter.   Signed, Peter Martinique, MD  05/01/2018 5:28 PM    [facility]

## 2018-05-01 ENCOUNTER — Encounter: Payer: Self-pay | Admitting: Cardiology

## 2018-05-01 ENCOUNTER — Ambulatory Visit (INDEPENDENT_AMBULATORY_CARE_PROVIDER_SITE_OTHER): Payer: Medicare Other | Admitting: Cardiology

## 2018-05-01 VITALS — BP 141/73 | HR 53 | Ht 66.0 in | Wt 161.3 lb

## 2018-05-01 DIAGNOSIS — I2583 Coronary atherosclerosis due to lipid rich plaque: Secondary | ICD-10-CM | POA: Diagnosis not present

## 2018-05-01 DIAGNOSIS — E1169 Type 2 diabetes mellitus with other specified complication: Secondary | ICD-10-CM | POA: Diagnosis not present

## 2018-05-01 DIAGNOSIS — I2510 Atherosclerotic heart disease of native coronary artery without angina pectoris: Secondary | ICD-10-CM | POA: Diagnosis not present

## 2018-05-01 DIAGNOSIS — I10 Essential (primary) hypertension: Secondary | ICD-10-CM | POA: Diagnosis not present

## 2018-05-01 NOTE — Patient Instructions (Signed)
Continue your current therapy  Follow up in one year 

## 2018-06-05 ENCOUNTER — Encounter (HOSPITAL_COMMUNITY): Payer: Self-pay

## 2018-06-05 NOTE — Pre-Procedure Instructions (Signed)
[place]  06/05/2018      [place], [place] [place] [address] Phone: [phone] Fax: [phone]    Your procedure is scheduled on December 16th.  Report to [facility] Admitting at [place].M.  Call this number if you have problems the morning of surgery:  [phone]   Remember:  Do not eat or drink after midnight.    Take these medicines the morning of surgery with A SIP OF WATER  amLODipine (NORVASC)  ezetimibe (ZETIA) metoprolol succinate (TOPROL-XL)  pravastatin (PRAVACHOL)  Hold Plavix 5 days prior to surgery per Dr. Peter Martinique. Follow your surgeon's instructions for when to stop your aspirin. If no instructions were given to you, call your surgeon's office.  7 days prior to surgery STOP taking any etodolac (LODINE), Aspirin(unless otherwise instructed by your surgeon), Aleve, Naproxen, Ibuprofen, Motrin, Advil, Goody's, BC's, all herbal medications, fish oil, and all vitamins   WHAT DO I DO ABOUT MY DIABETES MEDICATION?   [name] Do not take oral diabetes medicines (pills) the morning of surgery: metFORMIN (GLUCOPHAGE).  . The day of surgery, do not take other diabetes injectables, including Byetta (exenatide), Bydureon (exenatide ER), Victoza (liraglutide), or Trulicity (dulaglutide).  . If your CBG is greater than 220 mg/dL, you may take  of your sliding scale (correction) dose of insulin.   How to Manage Your Diabetes Before and After Surgery  Why is it important to control my blood sugar before and after surgery? . Improving blood sugar levels before and after surgery helps healing and can limit problems. . A way of improving blood sugar control is eating a healthy diet by: o  Eating less sugar and carbohydrates o  Increasing activity/exercise o  Talking with your doctor about reaching your blood sugar goals . High blood sugars (greater than 180 mg/dL) can raise your  risk of infections and slow your recovery, so you will need to focus on controlling your diabetes during the weeks before surgery. . Make sure that the doctor who takes care of your diabetes knows about your planned surgery including the date and location.  How do I manage my blood sugar before surgery? . Check your blood sugar at least 4 times a day, starting 2 days before surgery, to make sure that the level is not too high or low. o Check your blood sugar the morning of your surgery when you wake up and every 2 hours until you get to the Short Stay unit. . If your blood sugar is less than 70 mg/dL, you will need to treat for low blood sugar: o Do not take insulin. o Treat a low blood sugar (less than 70 mg/dL) with  cup of clear juice (cranberry or apple), 4 glucose tablets, OR glucose gel. o Recheck blood sugar in 15 minutes after treatment (to make sure it is greater than 70 mg/dL). If your blood sugar is not greater than 70 mg/dL on recheck, call [phone] for further instructions. . Report your blood sugar to the short stay nurse when you get to Short Stay.  . If you are admitted to the hospital after surgery: o Your blood sugar will be checked by the staff and you will probably be given insulin after surgery (instead of oral diabetes medicines) to make sure you have good blood sugar levels. o The goal for blood sugar control after surgery is 80-180 mg/dL.  Do not wear jewelry.  Do not wear lotions, powders, or colognes, or deodorant.  Men may shave face and neck.  Do not bring valuables to the hospital.  [facility] is not responsible for any belongings or valuables.  Contacts, dentures or bridgework may not be worn into surgery.  Leave your suitcase in the car.  After surgery it may be brought to your room.  For patients admitted to the hospital, discharge time will be determined by your treatment team.  Patients discharged the day of surgery will not be allowed to drive  home.    [place]- Preparing For Surgery  Before surgery, you can play an important role. Because skin is not sterile, your skin needs to be as free of germs as possible. You can reduce the number of germs on your skin by washing with CHG (chlorahexidine gluconate) Soap before surgery.  CHG is an antiseptic cleaner which kills germs and bonds with the skin to continue killing germs even after washing.    Oral Hygiene is also important to reduce your risk of infection.  Remember - BRUSH YOUR TEETH THE MORNING OF SURGERY WITH YOUR REGULAR TOOTHPASTE  Please do not use if you have an allergy to CHG or antibacterial soaps. If your skin becomes reddened/irritated stop using the CHG.  Do not shave (including legs and underarms) for at least 48 hours prior to first CHG shower. It is OK to shave your face.  Please follow these instructions carefully.   1. Shower the NIGHT BEFORE SURGERY and the MORNING OF SURGERY with CHG.   2. If you chose to wash your hair, wash your hair first as usual with your normal shampoo.  3. After you shampoo, rinse your hair and body thoroughly to remove the shampoo.  4. Use CHG as you would any other liquid soap. You can apply CHG directly to the skin and wash gently with a scrungie or a clean washcloth.   5. Apply the CHG Soap to your body ONLY FROM THE NECK DOWN.  Do not use on open wounds or open sores. Avoid contact with your eyes, ears, mouth and genitals (private parts). Wash Face and genitals (private parts)  with your normal soap.  6. Wash thoroughly, paying special attention to the area where your surgery will be performed.  7. Thoroughly rinse your body with warm water from the neck down.  8. DO NOT shower/wash with your normal soap after using and rinsing off the CHG Soap.  9. Pat yourself dry with a CLEAN TOWEL.  10. Wear CLEAN PAJAMAS to bed the night before surgery, wear comfortable clothes the morning of surgery  11. Place CLEAN SHEETS on your  bed the night of your first shower and DO NOT SLEEP WITH PETS.    Day of Surgery:  Do not apply any deodorants/lotions.  Please wear clean clothes to the hospital/surgery center.   Remember to brush your teeth WITH YOUR REGULAR TOOTHPASTE.    Please read over the following fact sheets that you were given.

## 2018-06-06 ENCOUNTER — Encounter (HOSPITAL_COMMUNITY)
Admission: RE | Admit: 2018-06-06 | Discharge: 2018-06-06 | Disposition: A | Discharge status: Home / Self Care | Payer: Medicare Other | Source: Ambulatory Visit | Attending: Otolaryngology | Admitting: Otolaryngology

## 2018-06-06 ENCOUNTER — Encounter (HOSPITAL_COMMUNITY): Payer: Self-pay

## 2018-06-06 DIAGNOSIS — Z01812 Encounter for preprocedural laboratory examination: Secondary | ICD-10-CM | POA: Insufficient documentation

## 2018-06-06 LAB — BASIC METABOLIC PANEL
Anion gap: 12 (ref 5–15)
BUN: 17 mg/dL (ref 8–23)
CO2: 25 mmol/L (ref 22–32)
Calcium: 9.6 mg/dL (ref 8.9–10.3)
Chloride: 103 mmol/L (ref 98–111)
Creatinine, Ser: 1.13 mg/dL (ref 0.61–1.24)
GLUCOSE: 122 mg/dL — AB (ref 70–99)
Potassium: 3.7 mmol/L (ref 3.5–5.1)
SODIUM: 140 mmol/L (ref 135–145)

## 2018-06-06 LAB — CBC
HCT: 45.2 % (ref 39.0–52.0)
Hemoglobin: 14.7 g/dL (ref 13.0–17.0)
MCH: 30.4 pg (ref 26.0–34.0)
MCHC: 32.5 g/dL (ref 30.0–36.0)
MCV: 93.4 fL (ref 80.0–100.0)
PLATELETS: 228 10*3/uL (ref 150–400)
RBC: 4.84 MIL/uL (ref 4.22–5.81)
RDW: 12.5 % (ref 11.5–15.5)
WBC: 6 10*3/uL (ref 4.0–10.5)
nRBC: 0 % (ref 0.0–0.2)

## 2018-06-06 LAB — GLUCOSE, CAPILLARY: Glucose-Capillary: 135 mg/dL — ABNORMAL HIGH (ref 70–99)

## 2018-06-07 NOTE — Anesthesia Preprocedure Evaluation (Addendum)
Anesthesia Evaluation  Patient identified by MRN, date of birth, ID band Patient awake    Reviewed: Allergy & Precautions, NPO status , Patient's Chart, lab work & pertinent test results, reviewed documented beta blocker date and time   History of Anesthesia Complications (+) Emergence Delirium and history of anesthetic complications  Airway Mallampati: II  TM Distance: >3 FB Neck ROM: Full    Dental no notable dental hx. (+) Teeth Intact, Caps, Dental Advisory Given   Pulmonary shortness of breath and with exertion, sleep apnea and Continuous Positive Airway Pressure Ventilation , former smoker,    Pulmonary exam normal breath sounds clear to auscultation       Cardiovascular hypertension, Pt. on medications and Pt. on home beta blockers + angina with exertion + CAD and + Cardiac Stents  Normal cardiovascular exam Rhythm:Regular Rate:Normal  Stent LCx 1998, RCA and LAD 2015 Asymptomatic Myocardial perfusion scan 06/2016- normal , no reversible ischemia   Neuro/Psych negative neurological ROS  negative psych ROS   GI/Hepatic negative GI ROS, Neg liver ROS,   Endo/Other  diabetes, Well Controlled, Type 2, Oral Hypoglycemic AgentsHyperlipidemia  Renal/GU Renal diseaseHx/o renal calculi  negative genitourinary   Musculoskeletal negative musculoskeletal ROS (+)   Abdominal   Peds  Hematology   Anesthesia Other Findings   Reproductive/Obstetrics                         Anesthesia Physical Anesthesia Plan  ASA: III  Anesthesia Plan: General   Post-op Pain Management:    Induction: Intravenous  PONV Risk Score and Plan: 4 or greater and Ondansetron, Dexamethasone and Treatment may vary due to age or medical condition  Airway Management Planned: Oral ETT  Additional Equipment:   Intra-op Plan:   Post-operative Plan: Extubation in OR  Informed Consent: I have reviewed the patients  History and Physical, chart, labs and discussed the procedure including the risks, benefits and alternatives for the proposed anesthesia with the patient or authorized representative who has indicated his/her understanding and acceptance.   Dental advisory given  Plan Discussed with: CRNA and Surgeon  Anesthesia Plan Comments: (Follows with cardiology, Dr. Martinique, for hx of CAD with BMS to the left circumflex in 1998 and subsequent stenting of the LAD and RCA in November 2015. Per Dr. Doug Sou note 05/01/18 "Myoview in January 2018 was negative for reversible ischemia with normal LVEF.  He is asymptomatic. Continue current therapy and follow up in one year.")      Anesthesia Quick Evaluation

## 2018-06-11 ENCOUNTER — Encounter (HOSPITAL_COMMUNITY): Payer: Self-pay | Admitting: Anesthesiology

## 2018-06-12 ENCOUNTER — Observation Stay (HOSPITAL_COMMUNITY)
Admission: RE | Admit: 2018-06-12 | Discharge: 2018-06-13 | Disposition: A | Discharge status: Home / Self Care | Payer: Medicare Other | Source: Ambulatory Visit | Attending: Otolaryngology | Admitting: Otolaryngology

## 2018-06-12 ENCOUNTER — Encounter (HOSPITAL_COMMUNITY)
Admission: RE | Disposition: A | Discharge status: Home / Self Care | Payer: Self-pay | Source: Ambulatory Visit | Attending: Otolaryngology

## 2018-06-12 ENCOUNTER — Inpatient Hospital Stay (HOSPITAL_COMMUNITY): Payer: Medicare Other | Admitting: Physician Assistant

## 2018-06-12 ENCOUNTER — Other Ambulatory Visit: Payer: Self-pay

## 2018-06-12 ENCOUNTER — Inpatient Hospital Stay (HOSPITAL_COMMUNITY): Payer: Medicare Other

## 2018-06-12 ENCOUNTER — Inpatient Hospital Stay (HOSPITAL_COMMUNITY): Payer: Medicare Other | Admitting: Anesthesiology

## 2018-06-12 ENCOUNTER — Encounter (HOSPITAL_COMMUNITY): Payer: Self-pay

## 2018-06-12 DIAGNOSIS — G4733 Obstructive sleep apnea (adult) (pediatric): Secondary | ICD-10-CM | POA: Diagnosis not present

## 2018-06-12 DIAGNOSIS — I2510 Atherosclerotic heart disease of native coronary artery without angina pectoris: Secondary | ICD-10-CM | POA: Insufficient documentation

## 2018-06-12 DIAGNOSIS — Z7982 Long term (current) use of aspirin: Secondary | ICD-10-CM | POA: Diagnosis not present

## 2018-06-12 DIAGNOSIS — Z7984 Long term (current) use of oral hypoglycemic drugs: Secondary | ICD-10-CM | POA: Insufficient documentation

## 2018-06-12 DIAGNOSIS — Z955 Presence of coronary angioplasty implant and graft: Secondary | ICD-10-CM | POA: Insufficient documentation

## 2018-06-12 DIAGNOSIS — E119 Type 2 diabetes mellitus without complications: Secondary | ICD-10-CM | POA: Diagnosis not present

## 2018-06-12 DIAGNOSIS — I10 Essential (primary) hypertension: Secondary | ICD-10-CM | POA: Insufficient documentation

## 2018-06-12 DIAGNOSIS — Z7902 Long term (current) use of antithrombotics/antiplatelets: Secondary | ICD-10-CM | POA: Diagnosis not present

## 2018-06-12 DIAGNOSIS — Z79899 Other long term (current) drug therapy: Secondary | ICD-10-CM | POA: Insufficient documentation

## 2018-06-12 DIAGNOSIS — Z87891 Personal history of nicotine dependence: Secondary | ICD-10-CM | POA: Insufficient documentation

## 2018-06-12 LAB — GLUCOSE, CAPILLARY
GLUCOSE-CAPILLARY: 164 mg/dL — AB (ref 70–99)
Glucose-Capillary: 131 mg/dL — ABNORMAL HIGH (ref 70–99)
Glucose-Capillary: 162 mg/dL — ABNORMAL HIGH (ref 70–99)
Glucose-Capillary: 179 mg/dL — ABNORMAL HIGH (ref 70–99)

## 2018-06-12 SURGERY — INSERTION, HYPOGLOSSAL NERVE STIMULATOR
Anesthesia: General

## 2018-06-12 MED ORDER — GLYCOPYRROLATE PF 0.2 MG/ML IJ SOSY
PREFILLED_SYRINGE | INTRAMUSCULAR | Status: DC | PRN
  Administered 2018-06-12: .2 mg via INTRAVENOUS

## 2018-06-12 MED ORDER — FENTANYL CITRATE (PF) 100 MCG/2ML IJ SOLN: 25.0000 ug | INTRAMUSCULAR | Status: DC | PRN

## 2018-06-12 MED ORDER — DEXAMETHASONE SODIUM PHOSPHATE 10 MG/ML IJ SOLN
INTRAMUSCULAR | Status: AC
  Filled 2018-06-12: qty 1

## 2018-06-12 MED ORDER — SUCCINYLCHOLINE CHLORIDE 200 MG/10ML IV SOSY
PREFILLED_SYRINGE | INTRAVENOUS | Status: AC
  Filled 2018-06-12: qty 10

## 2018-06-12 MED ORDER — PROPOFOL 10 MG/ML IV BOLUS
INTRAVENOUS | Status: AC
  Filled 2018-06-12: qty 20

## 2018-06-12 MED ORDER — 0.9 % SODIUM CHLORIDE (POUR BTL) OPTIME
TOPICAL | Status: DC | PRN
  Administered 2018-06-12: 1000 mL

## 2018-06-12 MED ORDER — PHENYLEPHRINE 40 MCG/ML (10ML) SYRINGE FOR IV PUSH (FOR BLOOD PRESSURE SUPPORT)
PREFILLED_SYRINGE | INTRAVENOUS | Status: AC
  Filled 2018-06-12: qty 10

## 2018-06-12 MED ORDER — EPHEDRINE 5 MG/ML INJ
INTRAVENOUS | Status: AC
  Filled 2018-06-12: qty 10

## 2018-06-12 MED ORDER — LIDOCAINE-EPINEPHRINE 1 %-1:100000 IJ SOLN
INTRAMUSCULAR | Status: AC
  Filled 2018-06-12: qty 1

## 2018-06-12 MED ORDER — LOSARTAN POTASSIUM 50 MG PO TABS
100.0000 mg | ORAL_TABLET | Freq: Every day | ORAL | Status: DC
  Administered 2018-06-13: 100 mg via ORAL
  Filled 2018-06-12: qty 2

## 2018-06-12 MED ORDER — SODIUM CHLORIDE 0.9 % IV SOLN
INTRAVENOUS | Status: DC | PRN
  Administered 2018-06-12: 500 mL

## 2018-06-12 MED ORDER — ETODOLAC 400 MG PO TABS
400.0000 mg | ORAL_TABLET | Freq: Every day | ORAL | Status: DC | PRN
  Filled 2018-06-12: qty 1

## 2018-06-12 MED ORDER — LIDOCAINE-EPINEPHRINE 1 %-1:100000 IJ SOLN
INTRAMUSCULAR | Status: DC | PRN
  Administered 2018-06-12: 8 mL

## 2018-06-12 MED ORDER — PRAVASTATIN SODIUM 10 MG PO TABS
10.0000 mg | ORAL_TABLET | Freq: Every day | ORAL | Status: DC
  Administered 2018-06-13: 10 mg via ORAL
  Filled 2018-06-12: qty 1

## 2018-06-12 MED ORDER — FENTANYL CITRATE (PF) 250 MCG/5ML IJ SOLN
INTRAMUSCULAR | Status: DC | PRN
  Administered 2018-06-12 (×2): 50 ug via INTRAVENOUS

## 2018-06-12 MED ORDER — ONDANSETRON HCL 4 MG/2ML IJ SOLN
INTRAMUSCULAR | Status: AC
  Filled 2018-06-12: qty 2

## 2018-06-12 MED ORDER — ASPIRIN 81 MG PO CHEW
81.0000 mg | CHEWABLE_TABLET | Freq: Every day | ORAL | Status: DC
  Administered 2018-06-13: 81 mg via ORAL
  Filled 2018-06-12: qty 1

## 2018-06-12 MED ORDER — MORPHINE SULFATE (PF) 2 MG/ML IV SOLN: 2.0000 mg | INTRAVENOUS | Status: DC | PRN

## 2018-06-12 MED ORDER — LIDOCAINE 2% (20 MG/ML) 5 ML SYRINGE
INTRAMUSCULAR | Status: AC
  Filled 2018-06-12: qty 5

## 2018-06-12 MED ORDER — EZETIMIBE 10 MG PO TABS
10.0000 mg | ORAL_TABLET | Freq: Every day | ORAL | Status: DC
  Administered 2018-06-13: 10 mg via ORAL
  Filled 2018-06-12: qty 1

## 2018-06-12 MED ORDER — LACTATED RINGERS IV SOLN
INTRAVENOUS | Status: DC | PRN
  Administered 2018-06-12 (×2): via INTRAVENOUS

## 2018-06-12 MED ORDER — SODIUM CHLORIDE 0.9 % IV SOLN
INTRAVENOUS | Status: AC
  Filled 2018-06-12: qty 500000

## 2018-06-12 MED ORDER — HYDROCHLOROTHIAZIDE 25 MG PO TABS
25.0000 mg | ORAL_TABLET | Freq: Every day | ORAL | Status: DC
  Administered 2018-06-13: 25 mg via ORAL
  Filled 2018-06-12: qty 1

## 2018-06-12 MED ORDER — ARTIFICIAL TEARS OPHTHALMIC OINT
TOPICAL_OINTMENT | OPHTHALMIC | Status: AC
  Filled 2018-06-12: qty 3.5

## 2018-06-12 MED ORDER — INSULIN ASPART 100 UNIT/ML ~~LOC~~ SOLN: 0.0000 [IU] | Freq: Every day | SUBCUTANEOUS | Status: DC

## 2018-06-12 MED ORDER — SUCCINYLCHOLINE CHLORIDE 200 MG/10ML IV SOSY
PREFILLED_SYRINGE | INTRAVENOUS | Status: DC | PRN
  Administered 2018-06-12: 100 mg via INTRAVENOUS

## 2018-06-12 MED ORDER — ARTIFICIAL TEARS OPHTHALMIC OINT
TOPICAL_OINTMENT | OPHTHALMIC | Status: DC | PRN
  Administered 2018-06-12: 1 via OPHTHALMIC

## 2018-06-12 MED ORDER — CEFAZOLIN SODIUM-DEXTROSE 2-4 GM/100ML-% IV SOLN
2.0000 g | INTRAVENOUS | Status: AC
  Administered 2018-06-12: 2 g via INTRAVENOUS
  Filled 2018-06-12: qty 100

## 2018-06-12 MED ORDER — POTASSIUM CHLORIDE IN NACL 20-0.45 MEQ/L-% IV SOLN
INTRAVENOUS | Status: DC
  Administered 2018-06-12 – 2018-06-13 (×2): via INTRAVENOUS
  Filled 2018-06-12 (×3): qty 1000

## 2018-06-12 MED ORDER — DEXAMETHASONE SODIUM PHOSPHATE 10 MG/ML IJ SOLN
INTRAMUSCULAR | Status: DC | PRN
  Administered 2018-06-12: 10 mg via INTRAVENOUS

## 2018-06-12 MED ORDER — METFORMIN HCL 500 MG PO TABS
500.0000 mg | ORAL_TABLET | Freq: Every day | ORAL | Status: DC
  Administered 2018-06-13: 500 mg via ORAL
  Filled 2018-06-12: qty 1

## 2018-06-12 MED ORDER — LIDOCAINE 2% (20 MG/ML) 5 ML SYRINGE
INTRAMUSCULAR | Status: DC | PRN
  Administered 2018-06-12: 80 mg via INTRAVENOUS

## 2018-06-12 MED ORDER — FENTANYL CITRATE (PF) 250 MCG/5ML IJ SOLN
INTRAMUSCULAR | Status: AC
  Filled 2018-06-12: qty 5

## 2018-06-12 MED ORDER — PROPOFOL 10 MG/ML IV BOLUS
INTRAVENOUS | Status: DC | PRN
  Administered 2018-06-12: 150 mg via INTRAVENOUS

## 2018-06-12 MED ORDER — AMLODIPINE BESYLATE 10 MG PO TABS
10.0000 mg | ORAL_TABLET | Freq: Every day | ORAL | Status: DC
  Administered 2018-06-13: 10 mg via ORAL
  Filled 2018-06-12: qty 1

## 2018-06-12 MED ORDER — INSULIN ASPART 100 UNIT/ML ~~LOC~~ SOLN
0.0000 [IU] | Freq: Three times a day (TID) | SUBCUTANEOUS | Status: DC
  Administered 2018-06-12: 3 [IU] via SUBCUTANEOUS
  Administered 2018-06-13: 2 [IU] via SUBCUTANEOUS

## 2018-06-12 MED ORDER — EPHEDRINE SULFATE-NACL 50-0.9 MG/10ML-% IV SOSY
PREFILLED_SYRINGE | INTRAVENOUS | Status: DC | PRN
  Administered 2018-06-12 (×3): 10 mg via INTRAVENOUS

## 2018-06-12 MED ORDER — ONDANSETRON HCL 4 MG/2ML IJ SOLN: 4.0000 mg | Freq: Once | INTRAMUSCULAR | Status: DC | PRN

## 2018-06-12 MED ORDER — METOPROLOL SUCCINATE ER 100 MG PO TB24
100.0000 mg | ORAL_TABLET | Freq: Every day | ORAL | Status: DC
  Administered 2018-06-13: 100 mg via ORAL
  Filled 2018-06-12: qty 1

## 2018-06-12 MED ORDER — SODIUM CHLORIDE 0.9 % IV SOLN
INTRAVENOUS | Status: DC | PRN
  Administered 2018-06-12: 25 ug/min via INTRAVENOUS

## 2018-06-12 MED ORDER — MEPERIDINE HCL 50 MG/ML IJ SOLN: 6.2500 mg | INTRAMUSCULAR | Status: DC | PRN

## 2018-06-12 MED ORDER — HYDROCODONE-ACETAMINOPHEN 5-325 MG PO TABS: 1.0000 | ORAL_TABLET | ORAL | Status: DC | PRN

## 2018-06-12 MED ORDER — ONDANSETRON HCL 4 MG/2ML IJ SOLN
INTRAMUSCULAR | Status: DC | PRN
  Administered 2018-06-12: 4 mg via INTRAVENOUS

## 2018-06-12 SURGICAL SUPPLY — 71 items
ADH SKN CLS APL DERMABOND .7 (GAUZE/BANDAGES/DRESSINGS) ×2
BAG DECANTER FOR FLEXI CONT (MISCELLANEOUS) ×3 IMPLANT
BIT DRILL 4 MINI HUDSON CANN (BIT) ×3 IMPLANT
BLADE CLIPPER SURG (BLADE) IMPLANT
BLADE SURG 15 STRL LF DISP TIS (BLADE) ×3 IMPLANT
BLADE SURG 15 STRL SS (BLADE) ×9
CANISTER SUCT 3000ML PPV (MISCELLANEOUS) ×3 IMPLANT
CORD BIPOLAR FORCEPS 12FT (ELECTRODE) ×3 IMPLANT
COVER PROBE W GEL 5X96 (DRAPES) ×3 IMPLANT
COVER SURGICAL LIGHT HANDLE (MISCELLANEOUS) ×3 IMPLANT
COVER WAND RF STERILE (DRAPES) ×3 IMPLANT
CRADLE DONUT ADULT HEAD (MISCELLANEOUS) ×3 IMPLANT
DERMABOND ADVANCED (GAUZE/BANDAGES/DRESSINGS) ×4
DERMABOND ADVANCED .7 DNX12 (GAUZE/BANDAGES/DRESSINGS) ×2 IMPLANT
DRAPE C-ARM 35X43 STRL (DRAPES) ×3 IMPLANT
DRAPE HEAD BAR (DRAPES) ×3 IMPLANT
DRAPE INCISE 13X13 STRL (DRAPES) IMPLANT
DRAPE INCISE IOBAN 66X45 STRL (DRAPES) ×3 IMPLANT
DRAPE MICROSCOPE LEICA 54X105 (DRAPE) ×3 IMPLANT
DRSG TEGADERM 2-3/8X2-3/4 SM (GAUZE/BANDAGES/DRESSINGS) ×6 IMPLANT
DRSG TEGADERM 4X4.75 (GAUZE/BANDAGES/DRESSINGS) ×2 IMPLANT
ELECT COATED BLADE 2.86 ST (ELECTRODE) ×3 IMPLANT
ELECT EMG 18 NIMS (NEUROSURGERY SUPPLIES) ×3
ELECTRODE EMG 18 NIMS (NEUROSURGERY SUPPLIES) ×1 IMPLANT
FORCEPS BIPOLAR SPETZLER 8 1.0 (NEUROSURGERY SUPPLIES) ×3 IMPLANT
FORCEPS TISS BAYO ENTCEPS (INSTRUMENTS) ×3 IMPLANT
GAUZE 4X4 16PLY RFD (DISPOSABLE) ×3 IMPLANT
GAUZE SPONGE 4X4 12PLY STRL (GAUZE/BANDAGES/DRESSINGS) ×3 IMPLANT
GENERATOR PULSE INSPIRE (Generator) ×3 IMPLANT
GLOVE BIO SURGEON STRL SZ 6.5 (GLOVE) IMPLANT
GLOVE BIO SURGEON STRL SZ7.5 (GLOVE) ×9 IMPLANT
GLOVE BIO SURGEONS STRL SZ 6.5 (GLOVE)
GLOVE SURG SS PI 6.0 STRL IVOR (GLOVE) ×4 IMPLANT
GOWN STRL REUS W/ TWL LRG LVL3 (GOWN DISPOSABLE) ×3 IMPLANT
GOWN STRL REUS W/TWL LRG LVL3 (GOWN DISPOSABLE) ×9
KIT BASIN OR (CUSTOM PROCEDURE TRAY) ×3 IMPLANT
KIT NEUROSTIMULATOR ACCESSORY (KITS) IMPLANT
KIT TURNOVER KIT B (KITS) ×3 IMPLANT
LEAD SENSING RESP INSPIRE (Lead) ×3 IMPLANT
LEAD SLEEP STIM INSPIRE IV/V (Lead) ×1 IMPLANT
LEAD SLEEP STIMULATION INSPIRE (Lead) ×3 IMPLANT
LOOP VESSEL MAXI BLUE (MISCELLANEOUS) ×3 IMPLANT
LOOP VESSEL MINI RED (MISCELLANEOUS) IMPLANT
MARKER SKIN DUAL TIP RULER LAB (MISCELLANEOUS) ×6 IMPLANT
NDL 18GX1X1/2 (RX/OR ONLY) (NEEDLE) IMPLANT
NDL HYPO 25GX1X1/2 BEV (NEEDLE) ×1 IMPLANT
NEEDLE 18GX1X1/2 (RX/OR ONLY) (NEEDLE) ×3 IMPLANT
NEEDLE HYPO 25GX1X1/2 BEV (NEEDLE) ×3 IMPLANT
NS IRRIG 1000ML POUR BTL (IV SOLUTION) ×3 IMPLANT
PAD ARMBOARD 7.5X6 YLW CONV (MISCELLANEOUS) ×3 IMPLANT
PASSER CATH 38CM DISP (INSTRUMENTS) ×3 IMPLANT
PENCIL BUTTON HOLSTER BLD 10FT (ELECTRODE) ×3 IMPLANT
PROBE NERVE STIMULATOR (NEUROSURGERY SUPPLIES) ×3 IMPLANT
REMOTE CONTROL SLEEP INSPIRE (MISCELLANEOUS) ×5 IMPLANT
SLING ARM FOAM STRAP LRG (SOFTGOODS) IMPLANT
SLING ARM FOAM STRAP MED (SOFTGOODS) IMPLANT
SPONGE INTESTINAL PEANUT (DISPOSABLE) ×5 IMPLANT
STAPLER VISISTAT 35W (STAPLE) ×3 IMPLANT
SUT SILK 2 0 SH (SUTURE) ×3 IMPLANT
SUT SILK 3 0 REEL (SUTURE) ×3 IMPLANT
SUT SILK 3-0 (SUTURE) ×3
SUT SILK 3-0 RB1 30XBRD (SUTURE) ×1
SUT VIC AB 3-0 SH 27 (SUTURE) ×9
SUT VIC AB 3-0 SH 27X BRD (SUTURE) ×1 IMPLANT
SUT VIC AB 4-0 PS2 27 (SUTURE) ×6 IMPLANT
SUTURE SILK 3-0 RB1 30XBRD (SUTURE) ×1 IMPLANT
SYR 10ML LL (SYRINGE) ×3 IMPLANT
SYR BULB IRRIGATION 50ML (SYRINGE) ×3 IMPLANT
TAPE CLOTH SURG 4X10 WHT LF (GAUZE/BANDAGES/DRESSINGS) ×3 IMPLANT
TOWEL GREEN STERILE (TOWEL DISPOSABLE) ×3 IMPLANT
TRAY ENT MC OR (CUSTOM PROCEDURE TRAY) ×3 IMPLANT

## 2018-06-12 NOTE — H&P (Signed)
Jeffery Schmidt is an 77 y.o. male.   Chief Complaint: Sleep apnea HPI: 77 year old male with obstructive sleep apnea having difficulty tolerating CPAP.  He presents for [facility] placement.  Past Medical History:  Diagnosis Date  . Blood transfusion without reported diagnosis   . CAD (coronary artery disease)    a. 1998 s/p BMS to LCX;  b. 12/2012 MV: EF 68%, no ischemia;  c. 04/2014 Cath/PCI: LM nl, LAD 90p (3.0x18 Biotronik Orsiro DES - 3.5), LCX 41m, patent stent, RCA  90 (2.5x18 Biotronik Orsiro DES - 2.75)  . Complication of anesthesia    anesthesia delirium after heart cath   . Diabetes mellitus without complication ([facility])   . Diastolic dysfunction    a. 04/2014 Echo: EF 60-65%, no rwma, Gr1 DD, mild MR, mildly dil LA.  [name] History of kidney stones    had nephrostomy for this about 20 years ago  . HLD (hyperlipidemia)   . HTN (hypertension)   . Kidney stones   . Obstructive sleep apnea    a. on CPAP  . Sleep apnea     Past Surgical History:  Procedure Laterality Date  . BLEPHAROPLASTY Bilateral 2000's  . COLONOSCOPY    . CORONARY ANGIOPLASTY  1998  . CORONARY ANGIOPLASTY WITH STENT PLACEMENT  05/07/14   stents to LAD and RCA, patent LCX stent   . DRUG INDUCED ENDOSCOPY N/A 02/22/2018   Procedure: DRUG INDUCED ENDOSCOPY;  Surgeon: Melida Quitter, MD;  Location: [facility];  Service: ENT;  Laterality: N/A;  . LEFT HEART CATHETERIZATION WITH CORONARY ANGIOGRAM N/A 05/07/2014   Procedure: LEFT HEART CATHETERIZATION WITH CORONARY ANGIOGRAM;  Surgeon: Peter M Martinique, MD;  Location: [facility] CATH LAB;  Service: Cardiovascular;  Laterality: N/A;  . LITHOTRIPSY  1990's  . NEPHROSTOMY  1990's  . PERCUTANEOUS CORONARY STENT INTERVENTION (PCI-S)  05/07/2014   Procedure: PERCUTANEOUS CORONARY STENT INTERVENTION (PCI-S);  Surgeon: Peter M Martinique, MD;  Location: [facility] CATH LAB;  Service: Cardiovascular;;  Drug eluting stent Prox LAD (3.0/18 Orsiro) and drug eluting stent to Mid RCA  (2.5/18 Orsiro)    Family History  Problem Relation Age of Onset  . Hypertension Mother   . Coronary artery disease Other   . Heart disease Other   . Hypertension Other   . Stroke Other   . Other Father        unknown   Social History:  reports that he has quit smoking. He has never used smokeless tobacco. He reports that he does not drink alcohol or use drugs.  Allergies:  Allergies  Allergen Reactions  . Brilinta [Ticagrelor] Shortness Of Breath  . Epinephrine Anaphylaxis and Other (See Comments)    Pt states he is anaphyactic    Medications Prior to Admission  Medication Sig Dispense Refill  . amLODipine (NORVASC) 10 MG tablet Take 10 mg by mouth daily.    . Ascorbic Acid (VITAMIN C PO) Take 500 mg by mouth daily.     [name] aspirin 81 MG tablet Take 81 mg by mouth daily.    . clopidogrel (PLAVIX) 75 MG tablet Take 1 tablet (75 mg total) by mouth daily. 30 tablet 11  . Coenzyme Q10 (COQ10 PO) Take 1 capsule by mouth daily.     [name] etodolac (LODINE) 400 MG tablet Take 400 mg by mouth daily as needed for mild pain.    [name] ezetimibe (ZETIA) 10 MG tablet Take 10 mg by mouth daily.    . Glucos-Chondroit-Hyaluron-MSM (GLUCOSAMINE CHONDROITIN  JOINT PO) Take 1 tablet by mouth daily.    . hydrochlorothiazide (HYDRODIURIL) 25 MG tablet Take 25 mg by mouth daily.    [name] losartan (COZAAR) 100 MG tablet Take 100 mg by mouth daily.    . metFORMIN (GLUCOPHAGE) 500 MG tablet Take 500 mg by mouth daily with breakfast.     . metoprolol succinate (TOPROL-XL) 100 MG 24 hr tablet Take 100 mg by mouth daily.     . Omega-3 Fatty Acids (FISH OIL) 1200 MG CAPS Take 1,200 mg by mouth daily.    . pravastatin (PRAVACHOL) 20 MG tablet Take 10 mg by mouth daily.    [name] VITAMIN D PO Take 400 Units by mouth daily.       Results for orders placed or performed during the hospital encounter of 06/12/18 (from the past 48 hour(s))  Glucose, capillary     Status: Abnormal   Collection Time: 06/12/18  5:54 AM  Result  Value Ref Range   Glucose-Capillary 131 (H) 70 - 99 mg/dL   No results found.  Review of Systems  All other systems reviewed and are negative.   Blood pressure (!) 148/58, pulse 94, temperature 97.7 F (36.5 C), temperature source Oral, resp. rate 18, height 5' 6.75" (1.695 m), weight 73 kg, SpO2 94 %. Physical Exam  Constitutional: He is oriented to person, place, and time. He appears well-developed and well-nourished. No distress.  HENT:  Head: Normocephalic and atraumatic.  Right Ear: External ear normal.  Left Ear: External ear normal.  Nose: Nose normal.  Mouth/Throat: Oropharynx is clear and moist.  Eyes: Pupils are equal, round, and reactive to light. Conjunctivae and EOM are normal.  Neck: Normal range of motion. Neck supple.  Cardiovascular: Normal rate.  Respiratory: Effort normal.  Musculoskeletal: Normal range of motion.  Neurological: He is alert and oriented to person, place, and time. No cranial nerve deficit.  Skin: Skin is warm and dry.  Psychiatric: He has a normal mood and affect. His behavior is normal. Judgment and thought content normal.     Assessment/Plan Obstructive sleep apnea To OR for hypoglossal nerve stimulator placement.  Melida Quitter, MD 06/12/2018, 7:24 AM

## 2018-06-12 NOTE — Anesthesia Procedure Notes (Signed)
Procedure Name: Intubation Date/Time: 06/12/2018 7:35 AM Performed by: Renato Shin, CRNA Pre-anesthesia Checklist: Patient identified, Emergency Drugs available, Suction available and Patient being monitored Patient Re-evaluated:Patient Re-evaluated prior to induction Oxygen Delivery Method: Circle system utilized Preoxygenation: Pre-oxygenation with 100% oxygen Induction Type: IV induction Ventilation: Oral airway inserted - appropriate to patient size and Mask ventilation without difficulty Laryngoscope Size: Miller and 3 Grade View: Grade I Tube type: Oral Tube size: 7.5 mm Number of attempts: 1 Airway Equipment and Method: Stylet Placement Confirmation: ETT inserted through vocal cords under direct vision,  positive ETCO2 and breath sounds checked- equal and bilateral Secured at: 23 cm Tube secured with: Tape Dental Injury: Teeth and Oropharynx as per pre-operative assessment

## 2018-06-12 NOTE — Op Note (Signed)
NAME: Jeffery Schmidt, Jeffery Schmidt MEDICAL RECORD [id number] ACCOUNT [id number] DATE OF BIRTH:1941-05-31 FACILITY: [facility] LOCATION: [facility] PHYSICIAN:Shaylin Blatt DRedmond Baseman, MD  OPERATIVE REPORT  DATE OF PROCEDURE:  06/12/2018  PREOPERATIVE DIAGNOSIS:  Obstructive sleep apnea.  POSTOPERATIVE DIAGNOSIS:  Obstructive sleep apnea.  PROCEDURE:  Placement of hypoglossal nerve stimulator.  SURGEON:  Melida Quitter, MD.  ASSISTANT:  Jolene Provost, PA  ANESTHESIA:  General endotracheal anesthesia.  COMPLICATIONS:  None.  INDICATIONS:  The patient is a 77 year old male with obstructive sleep apnea who has had difficulty tolerating CPAP.  He has met criteria for placement of the hypoglossal nerve stimulator and presents for surgical management.  FINDINGS:  Surgical anatomy was normal.  The implant was tested in the operating room and had excellent results.  DESCRIPTION OF PROCEDURE:  The patient was identified in the holding room, informed consent having been obtained with discussion of risks, benefits and alternatives, the patient was brought to the operative suite and put the operative table in supine  position.  Anesthesia was induced and the patient was intubated by the anesthesia team without difficulty.  The patient was given intravenous antibiotics during the case.  The eyes were taped closed and the bed was turned 180 degrees from anesthesia.   The nerve integrity monitor was placed in the tongue with one electrode in the lateral tongue musculature and the other in the floor of mouth musculature.  The monitor was turned on during the case.  The incisions were marked with a marking pen and  injected with 1% lidocaine with 1:100,000 epinephrine.  This included a right neck incision, right subclavicular incision, and right lateral chest wall incision.  The neck and chest were prepped and draped in a sterile fashion.  The neck incision was  made with a 15 blade scalpel through the skin and extended  through the subcutaneous tissue down to  platysma muscle moving in an inferior direction.  The platysma muscle was divided and a self-retaining retractor was added.  Dissection was then performed  deeply to the inferior edge of the submandibular gland where the digastric tendon was encountered and dissected.  The vessel loops were placed around the tendon and it was retracted inferiorly.  The mylohyoid muscle was then dissected and retracted  anteriorly exposing the hypoglossal nerve.  The nerve was then carefully dissected removing fascia and exposing the various nerve branches which were stimulated using the bipolar stimulator.  The nerve was further exposed by dividing the overlying veins.   These were ligated.  At one point, with further dissection, there was some bleeding and so a gauze was placed.  Gauze was packed into the neck and this wound left for a moment.  The subclavicular incision was then made with a 15 blade scalpel through  the skin and extended through subcutaneous tissues down to the pectoralis fascia using Bovie electrocautery.  A finger dissection was then performed inferiorly making a pocket for the generator.   This wound was then left.  The lateral chest wall  incision was then made with a blade scalpel through the skin and extended through subcutaneous tissues and removed with Bovie electrocautery down to the serratus muscle.  Overlying an intercostal space, the serratus was bluntly spread exposing the  external intercostal muscle, which was also bluntly spread exposing the internal intercostal muscle.  A plane was then developed between the external and internal intercostal muscles using a malleable.  The sensory lead was then advanced under the  malleable into the intercostal space easily.  The malleable was removed.  The flange for the sensor lead was then sutured into place using 3-0 silk suture, placing 2 sutures.  The second flange was then placed with a little bit of slack  between the 2 and  sutured in place with 2 sutures of 3-0 silk suture as well.  At this point, the neck incision was again explored and the operating microscope was brought into view.  Careful dissection was then performed along the nerve with the small bleeding spot  controlled with bipolar electrocautery.  The various branches of the nerves were then stimulated with the nerve were then stimulated identifying exclusion branches an inclusion branches.  Dissection was then performed around the portion of the nerve that  included all the inclusion branches and the stimulator cuff was then brought into the field and wrapped around those branches with the stimulator portion tucked in.  At this point, the lead was then tucked under the digastric tendon and then the anchor  was sutured to the tendon using 3-0 silk suture in 2 positions.  Each lead was then tunneled using the tunneler to the subclavicular space.  A little bit of slack was left in both places in both the neck and the lateral chest.  The generator was then  opened and brought into the field.  The leads were then placed into the generator and screwed into place using the included screwdriver.  The generator was then turned in a clockwise fashion until the remaining wires were coiled around the generator and  it was placed into the dissected cavity.  After placing the cuff on the hypoglossal nerve, the space under the cuff was injected with saline.  At this point, the device was tested and found to have an excellent response.  At this point, each wound was  copiously irrigated with bacitracin saline.  The chest wall incisions were closed with 3-0 Vicryl suture in a subcutaneous layer, and Dermabond on the skin.  The neck incision was closed with 3-0 Vicryl suture in a simple interrupted fashion in the  platysmal layer and 4-0 Vicryl suture in a simple interrupted fashion in the subcutaneous layer.  Skin was closed with Dermabond.    At this point,  drapes were removed and the patient was cleaned off.  The electrodes were removed.  He was turned back to Anesthesia for wakeup.  Pressure was applied in the neck during wakeup.  He was extubated in the recovery room in stable condition.  AN/NUANCE  D:06/12/2018 T:06/12/2018 JOB:[id number]

## 2018-06-12 NOTE — Anesthesia Postprocedure Evaluation (Signed)
Anesthesia Post Note  Patient: Jeffery Schmidt  Procedure(s) Performed: IMPLANTATION OF HYPOGLOSSAL NERVE STIMULATOR (N/A )     Patient location during evaluation: PACU Anesthesia Type: General Level of consciousness: awake and alert and oriented Pain management: pain level controlled Vital Signs Assessment: post-procedure vital signs reviewed and stable Respiratory status: spontaneous breathing, nonlabored ventilation and respiratory function stable Cardiovascular status: blood pressure returned to baseline and stable Postop Assessment: no apparent nausea or vomiting Anesthetic complications: no    Last Vitals:  Vitals:   06/12/18 1034 06/12/18 1100  BP:  131/62  Pulse:  (!) 56  Resp:  14  Temp: (!) 36.1 C   SpO2:  96%    Last Pain:  Vitals:   06/12/18 1100  TempSrc:   PainSc: 0-No pain                 Jenkins Risdon A.

## 2018-06-12 NOTE — Progress Notes (Signed)
ENT Post Operative Note  Subjective: No nausea, vomiting No difficulty voiding Pain well controlled  Vitals:   06/12/18 1200 06/12/18 1218  BP:  (!) 122/57  Pulse:  (!) 53  Resp:    Temp: (!) 97.4 F (36.3 C) 97.6 F (36.4 C)  SpO2:  92%     OBJECTIVE  Gen: alert, cooperative, appropriate Head/ENT: EOMI, neck supple, mucus membranes moist and pink, conjunctiva clear Incisions c/d/i Tongue moves symmetrically Respiratory: Voice without dysphonia. non-labored breathing, no accessory muscle use, normal HR, good O2 saturations    ASSESS/ PLAN  Jeffery Schmidt is a 77 y.o. male who is POD0 rom Inspire Implatation.  -pain control -MIVF- will saline lock when tolerating PO intake -wound care, routine  Helayne Seminole, MD

## 2018-06-12 NOTE — Transfer of Care (Signed)
Immediate Anesthesia Transfer of Care Note  Patient: Jeffery Schmidt  Procedure(s) Performed: IMPLANTATION OF HYPOGLOSSAL NERVE STIMULATOR (N/A )  Patient Location: PACU  Anesthesia Type:General  Level of Consciousness: awake, alert , drowsy and patient cooperative  Airway & Oxygen Therapy: Patient Spontanous Breathing and Patient connected to nasal cannula oxygen  Post-op Assessment: Report given to RN and Post -op Vital signs reviewed and stable  Post vital signs: Reviewed and stable  Last Vitals:  Vitals Value Taken Time  BP    Temp    Pulse 62 06/12/2018 10:31 AM  Resp 13 06/12/2018 10:31 AM  SpO2 96 % 06/12/2018 10:31 AM  Vitals shown include unvalidated device data.  Last Pain:  Vitals:   06/12/18 0605  TempSrc:   PainSc: 0-No pain         Complications: No apparent anesthesia complications

## 2018-06-12 NOTE — Brief Op Note (Signed)
06/12/2018  10:17 AM  PATIENT:  Odella Aquas Leitz  77 y.o. male  PRE-OPERATIVE DIAGNOSIS:  Obstructive sleep apea  POST-OPERATIVE DIAGNOSIS:  obstructive sleep apnea  PROCEDURE:  Procedure(s): IMPLANTATION OF HYPOGLOSSAL NERVE STIMULATOR (N/A)  SURGEON:  Surgeon(s) and Role:    Melida Quitter, MD - Primary  PHYSICIAN ASSISTANT: Nordbladh  ASSISTANTS: none   ANESTHESIA:   general  EBL:  25 mL   BLOOD ADMINISTERED:none  DRAINS: none   LOCAL MEDICATIONS USED:  LIDOCAINE   SPECIMEN:  No Specimen  DISPOSITION OF SPECIMEN:  N/A  COUNTS:  YES  TOURNIQUET:  * No tourniquets in log *  DICTATION: .Other Dictation: Dictation Number [id number]  PLAN OF CARE: Admit for overnight observation  PATIENT DISPOSITION:  PACU - hemodynamically stable.   Delay start of Pharmacological VTE agent (>24hrs) due to surgical blood loss or risk of bleeding: yes

## 2018-06-12 NOTE — Progress Notes (Signed)
Orthopedic Tech Progress Note Patient Details:  Jeffery Schmidt [birth year] [id number]  Ortho Devices Type of Ortho Device: Arm sling Ortho Device/Splint Location: rue Ortho Device/Splint Interventions: Ordered, Application, Adjustment   Post Interventions Patient Tolerated: Well Instructions Provided: Care of device, Adjustment of device   Karolee Stamps 06/12/2018, 11:20 AM

## 2018-06-13 DIAGNOSIS — G4733 Obstructive sleep apnea (adult) (pediatric): Secondary | ICD-10-CM | POA: Diagnosis not present

## 2018-06-13 LAB — GLUCOSE, CAPILLARY: Glucose-Capillary: 147 mg/dL — ABNORMAL HIGH (ref 70–99)

## 2018-06-13 NOTE — Discharge Summary (Signed)
Physician Discharge Summary  Patient ID: Jeffery Schmidt MRN: [id number] DOB/AGE: [birth year] 77 y.o.  Admit date: 06/12/2018 Discharge date: 06/13/2018  Admission Diagnoses: Obstructive sleep apnea  Discharge Diagnoses:  Active Problems:   OSA (obstructive sleep apnea)   Discharged Condition: good  Hospital Course: 77 year old male with obstructive sleep apnea who presented to the hospital for placement of a hypoglossal nerve stimulator.  See operative note.  He was observed overnight and did quite well with no significant pain.  He had difficulty swallowing the night after surgery but this has improved by POD 1.  He is felt stable for discharge.  Consults: None  Significant Diagnostic Studies: None  Treatments: surgery: Hypoglossal nerve stimulator placement  Discharge Exam: Blood pressure 130/65, pulse (!) 58, temperature 98.1 F (36.7 C), temperature source Oral, resp. rate 17, height 5' 6.75" (1.695 m), weight 73 kg, SpO2 90 %. General appearance: alert, cooperative, no distress and Right lower lip with very slight weakness (improved from immediately post-op) Neck: incision clean and intact, some edema, no fluid collection Chest wall: two incisions clean and intact with no fluid collections  Disposition: Discharge disposition: 01-Home or Self Care       Discharge Instructions    Diet - low sodium heart healthy   Complete by:  As directed    Discharge instructions   Complete by:  As directed    OK to allow incisions to get wet, gently pat dry.  Do not apply ointment to the incisions.  Avoid strenuous activity, particularly using the right arm.  Resume Plavix Thursday.   Increase activity slowly   Complete by:  As directed      Allergies as of 06/13/2018      Reactions   Brilinta [ticagrelor] Shortness Of Breath   Epinephrine Anaphylaxis, Other (See Comments)   Pt states he is anaphyactic      Medication List    TAKE these medications   amLODipine 10 MG  tablet Commonly known as:  NORVASC Take 10 mg by mouth daily.   aspirin 81 MG tablet Take 81 mg by mouth daily.   clopidogrel 75 MG tablet Commonly known as:  PLAVIX Take 1 tablet (75 mg total) by mouth daily.   COQ10 PO Take 1 capsule by mouth daily.   etodolac 400 MG tablet Commonly known as:  LODINE Take 400 mg by mouth daily as needed for mild pain.   ezetimibe 10 MG tablet Commonly known as:  ZETIA Take 10 mg by mouth daily.   Fish Oil 1200 MG Caps Take 1,200 mg by mouth daily.   GLUCOSAMINE CHONDROITIN JOINT PO Take 1 tablet by mouth daily.   hydrochlorothiazide 25 MG tablet Commonly known as:  HYDRODIURIL Take 25 mg by mouth daily.   losartan 100 MG tablet Commonly known as:  COZAAR Take 100 mg by mouth daily.   metFORMIN 500 MG tablet Commonly known as:  GLUCOPHAGE Take 500 mg by mouth daily with breakfast.   metoprolol succinate 100 MG 24 hr tablet Commonly known as:  TOPROL-XL Take 100 mg by mouth daily.   pravastatin 20 MG tablet Commonly known as:  PRAVACHOL Take 10 mg by mouth daily.   VITAMIN C PO Take 500 mg by mouth daily.   VITAMIN D PO Take 400 Units by mouth daily.      Follow-up Information    Melida Quitter, MD. Schedule an appointment as soon as possible for a visit on 06/16/2018.   Specialty:  Otolaryngology Contact information:  [address] [place] [address] [phone]           Signed: Melida Quitter 06/13/2018, 8:29 AM

## 2018-06-13 NOTE — Progress Notes (Signed)
Discharge instructions (including medications) discussed with and copy provided to patient/caregiver 

## 2018-06-13 NOTE — Plan of Care (Signed)
  Problem: Education: Goal: Required Educational Video(s) Outcome: Progressing   Problem: Clinical Measurements: Goal: Postoperative complications will be avoided or minimized Outcome: Progressing   Problem: Skin Integrity: Goal: Demonstration of wound healing without infection will improve Outcome: Progressing   

## 2018-10-31 ENCOUNTER — Telehealth: Payer: Self-pay | Admitting: Neurology

## 2018-10-31 NOTE — Telephone Encounter (Signed)
Due to current COVID 19 pandemic, our office is severely reducing in office visits, in order to minimize the risk to our patients and healthcare providers.   Pt understands that although there may be some limitations with this type of visit, we will take all precautions to reduce any security or privacy concerns.  Pt understands that this will be treated like an in office visit and we will file with pt's insurance, and there may be a patient responsible charge related to this service.  Pt's email is [email or web address].  Pt understands that the nurse will be calling to go over pt's chart.

## 2018-11-09 ENCOUNTER — Encounter: Payer: Self-pay | Admitting: Neurology

## 2018-11-09 NOTE — Telephone Encounter (Signed)
Called the patient to review his chart no answer. LVM for pt to call back.

## 2018-11-10 NOTE — Telephone Encounter (Signed)
Pt returned call. Pt would like for you to call back Monday.

## 2018-11-13 ENCOUNTER — Encounter: Payer: Self-pay | Admitting: Neurology

## 2018-11-13 NOTE — Telephone Encounter (Signed)
Called the patient to review their chart and made sure that everything was up to date. Patient informed they received the e-mail/text message for the visit. Instructed to make sure they hold on to the e-mail/text for the upcoming appointment as it is necessary to access their appointment. Instructed the patient that apx 30 min prior to the appointment the front staff will contact them to make sure they are ready to go for their appointment in case there is any need for troubleshooting it can be completed prior to the appointment time. Reminded the patient once more that this is treated as a Office visit and the patient must be prepared for the visit and ready at the time of their appointment preferably in a well lit area where they have good connection for the visit. Pt verbalized understanding.  I sent a link for the patient to his cell. Pt states that depending on the day sometimes internet works well and other times it doesn't.   Sitting and reading:1 Watching TV:2 Sitting inactive in a public place (ex. Theater or meeting):0 As a passenger in a car for an hour without a break:1 Lying down to rest in the afternoon:0 Sitting and talking to someone:0 Sitting quietly after lunch (no alcohol):0 In a car, while stopped in traffic:0 Total:4  Yes to sleep study 4-5 yrs ago, attempted several machines and mask and unable to tolerate. Then the pt attempted Dental device which also didn't work. He then completed the surgery for inspire and it was turned on Jan/feb.

## 2018-11-13 NOTE — Addendum Note (Signed)
Addended by: Darleen Crocker on: 11/13/2018 09:02 AM   Modules accepted: Orders

## 2018-11-14 ENCOUNTER — Ambulatory Visit (INDEPENDENT_AMBULATORY_CARE_PROVIDER_SITE_OTHER): Payer: Medicare Other | Admitting: Neurology

## 2018-11-14 ENCOUNTER — Other Ambulatory Visit: Payer: Self-pay

## 2018-11-14 ENCOUNTER — Encounter: Payer: Self-pay | Admitting: Neurology

## 2018-11-14 DIAGNOSIS — E1169 Type 2 diabetes mellitus with other specified complication: Secondary | ICD-10-CM

## 2018-11-14 DIAGNOSIS — G4733 Obstructive sleep apnea (adult) (pediatric): Secondary | ICD-10-CM

## 2018-11-14 DIAGNOSIS — Z789 Other specified health status: Secondary | ICD-10-CM

## 2018-11-14 NOTE — Patient Instructions (Signed)
The patient will return to the sleep lab for a "Titration to INSPIRE", I will be personally present.  Goal is to increase the hypoglossal simulation to the best tolerated and effective setting now that the device is implanted, the scars well healed and he already has noted benefit .  I suggested an appointment in the first week of June at [facility] [place] so I can be present.    Larey Seat, MD    Larey Seat, MD 11/14/2018, 9:03 AM

## 2018-11-14 NOTE — Progress Notes (Signed)
Virtual Visit via Video Note  I connected with Jeffery Schmidt on 11/14/18 at  9:00 AM EDT by a video enabled telemedicine application and verified that I am speaking with the correct person using two identifiers.  Location: Patient: On the patio at his [place] residence.  Provider: At [facility] office.   I discussed the limitations of evaluation and management by telemedicine and the availability of in person appointments. The patient expressed understanding and agreed to proceed.  The patient was advised to call back or seek an in-person evaluation if the symptoms worsen or if the condition fails to improve as anticipated.  I provided 12 minutes of non-face-to-face time during this encounter.   Jeffery Seat, MD    SLEEP MEDICINE CLINIC   Provider:  Larey Schmidt, M D  Primary Care Physician:  Jeffery Erp, MD   Referring Provider: Levin Erp, MD    HPI:  Jeffery Schmidt is a 78 y.o. male patient  , whom I had met before upon referral from ENT specialist Jeffery Jeffery Baseman, MD.  Jeffery Schmidt reported that he had been previously multiple times assessed for sleep apnea he started using CPAP 5 years ago but could not tolerate positive airway pressure therapy.  His sleep was too interrupted he did not feel restored or refreshed and therefore discontinued the use.  He also attempted to improve with a dental device which also has failed.  In December I saw him briefly in the sleep lab outside the regular sleep clinic appointments and he had just been implanted with an inspire device.  The device was turned on in January 2020.  The patient reports tolerating the device very well his only concern was that he had already wore out the batteries of the remote control.   Sleep and medical history; see above. Jeffery. Peter Schmidt is his cardiologist; Jeffery. Melida Schmidt his ENT physician.   He has a longstanding history of known coronary artery disease diagnosed first in 1998 according to my records.   In 2014 he had an ejection fraction of 68 no signs of ischemia in November 2015 he had a cardiac catheterization and percutaneous stent placement.  LAD and left circumflex were stented right coronary artery, with a Biotronik posterior device.  The patient carries a diagnosis of mild mitral valve regurgitation and a mildly dilated  left atrium.  After the heart catheterization he had a brief delirium.  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  I reviewed the patient's current medications in detail. OR note reviewed. Cardiac visit note reviewed.     Family sleep history: mother with HTN, CAD and CVA   Social history:  Very remote smoking history, never more than 5 a day. Caffeine occasionally, ETOH : none.   Sleep habits are as follows: Bedtime is between 10-11 Pm and going to sleep is not a problem. With the inspire device he has been snoring less and sleeping deeper, feels refreshed and restored, averaging 7 hours of sleep with fewer nocturia.     Review of Systems: Out of a complete 14 system review, the patient complains of only the following symptoms, and all other reviewed systems are negative.   How likely are you to doze in the following situations: 0 = not likely, 1 = slight chance, 2 = moderate chance, 3 = high chance  Sitting and Reading? Watching Television? Sitting inactive in a public place (theater  or meeting)? Lying down in the afternoon when circumstances permit? Sitting and talking to someone? Sitting quietly after lunch without alcohol? In a car, while stopped for a few minutes in traffic? As a passenger in a car for an hour without a break?  Total =     Social History   Socioeconomic History  . Marital status: Married    Spouse name: Jeffery Schmidt  . Number of children: 6  . Years of education: Not on file  . Highest education  level: Not on file  Occupational History  . Occupation: retired  Scientific laboratory technician  . Financial resource strain: Not on file  . Food insecurity:    Worry: Not on file    Inability: Not on file  . Transportation needs:    Medical: Not on file    Non-medical: Not on file  Tobacco Use  . Smoking status: Former Research scientist (life sciences)  . Smokeless tobacco: Never Used  . Tobacco comment: "smoked cigarettes a thousand years ago; maybe a cigarette qod for a time"  Substance and Sexual Activity  . Alcohol use: No  . Drug use: No  . Sexual activity: Not Currently  Lifestyle  . Physical activity:    Days per week: Not on file    Minutes per session: Not on file  . Stress: Not on file  Relationships  . Social connections:    Talks on phone: Not on file    Gets together: Not on file    Attends religious service: Not on file    Active member of club or organization: Not on file    Attends meetings of clubs or organizations: Not on file    Relationship status: Not on file  . Intimate partner violence:    Fear of current or ex partner: Not on file    Emotionally abused: Not on file    Physically abused: Not on file    Forced sexual activity: Not on file  Other Topics Concern  . Not on file  Social History Narrative  . Not on file    Family History  Problem Relation Age of Onset  . Hypertension Mother   . Coronary artery disease Other   . Heart disease Other   . Hypertension Other   . Stroke Other   . Other Father        unknown    Past Medical History:  Diagnosis Date  . Blood transfusion without reported diagnosis   . CAD (coronary artery disease)    a. 1998 s/p BMS to LCX;  b. 12/2012 MV: EF 68%, no ischemia;  c. 04/2014 Cath/PCI: LM nl, LAD 90p (3.0x18 Biotronik Orsiro DES - 3.5), LCX 43m patent stent, RCA  90 (2.5x18 Biotronik Orsiro DES - 2.75)  . Complication of anesthesia    anesthesia delirium after heart cath   . Diabetes mellitus without complication ([place]   . Diastolic dysfunction     a. 04/2014 Echo: EF 60-65%, no rwma, Gr1 DD, mild MR, mildly dil LA.  .Marland KitchenHistory of kidney stones    had nephrostomy for this about 20 years ago  . HLD (hyperlipidemia)   . HTN (hypertension)   . Kidney stones   . Obstructive sleep apnea    a. on CPAP  . Sleep apnea     Past Surgical History:  Procedure Laterality Date  . BLEPHAROPLASTY Bilateral 2000's  . COLONOSCOPY    . CORONARY ANGIOPLASTY  1998  . CORONARY ANGIOPLASTY WITH STENT PLACEMENT  05/07/14   stents  to LAD and RCA, patent LCX stent   . DRUG INDUCED ENDOSCOPY N/A 02/22/2018   Procedure: DRUG INDUCED ENDOSCOPY;  Surgeon: Jeffery Quitter, MD;  Location: [place];  Service: ENT;  Laterality: N/A;  . LEFT HEART CATHETERIZATION WITH CORONARY ANGIOGRAM N/A 05/07/2014   Procedure: LEFT HEART CATHETERIZATION WITH CORONARY ANGIOGRAM;  Surgeon: Jeffery M Martinique, MD;  Location: [facility] CATH LAB;  Service: Cardiovascular;  Laterality: N/A;  . LITHOTRIPSY  1990's  . NEPHROSTOMY  1990's  . PERCUTANEOUS CORONARY STENT INTERVENTION (PCI-S)  05/07/2014   Procedure: PERCUTANEOUS CORONARY STENT INTERVENTION (PCI-S);  Surgeon: Jeffery M Martinique, MD;  Location: [facility] CATH LAB;  Service: Cardiovascular;;  Drug eluting stent Prox LAD (3.0/18 Orsiro) and drug eluting stent to Mid RCA (2.5/18 Orsiro)    Current Outpatient Medications  Medication Sig Dispense Refill  . amLODipine (NORVASC) 10 MG tablet Take 10 mg by mouth daily.    . Ascorbic Acid (VITAMIN C PO) Take 500 mg by mouth daily.     [name] aspirin 81 MG tablet Take 81 mg by mouth daily.    . clopidogrel (PLAVIX) 75 MG tablet Take 1 tablet (75 mg total) by mouth daily. 30 tablet 11  . Coenzyme Q10 (COQ10 PO) Take 1 capsule by mouth daily.     [name] etodolac (LODINE) 400 MG tablet Take 400 mg by mouth daily as needed for mild pain.    [name] ezetimibe (ZETIA) 10 MG tablet Take 10 mg by mouth daily.    . Glucos-Chondroit-Hyaluron-MSM (GLUCOSAMINE CHONDROITIN JOINT PO) Take 1 tablet by mouth  daily.    . hydrochlorothiazide (HYDRODIURIL) 25 MG tablet Take 25 mg by mouth daily.    [name] losartan (COZAAR) 100 MG tablet Take 100 mg by mouth daily.    . metFORMIN (GLUCOPHAGE) 500 MG tablet Take 500 mg by mouth daily with breakfast.     . metoprolol succinate (TOPROL-XL) 100 MG 24 hr tablet Take 100 mg by mouth daily.     . Omega-3 Fatty Acids (FISH OIL) 1200 MG CAPS Take 1,200 mg by mouth daily.    . pravastatin (PRAVACHOL) 20 MG tablet Take 10 mg by mouth daily.    [name] VITAMIN D PO Take 400 Units by mouth daily.      No current facility-administered medications for this visit.     Allergies as of 11/14/2018 - Review Complete 11/13/2018  Allergen Reaction Noted  . Brilinta [ticagrelor] Shortness Of Breath 06/30/2016  . Epinephrine Anaphylaxis and Other (See Comments) 10/19/2011    Vitals: There were no vitals taken for this visit. Last Weight:  Wt Readings from Last 1 Encounters:  06/12/18 161 lb (73 kg)   MVH:QIONG is no height or weight on file to calculate BMI.       Last Height:   Ht Readings from Last 1 Encounters:  06/12/18 5' 6.75" (1.695 m)    Physical exam:  General: The patient is awake, alert and appears not in acute distress. The patient is well groomed. Head: Normocephalic, atraumatic. Neck is supple. Mallampati 3 ,  neck circumference:17"  Nasal airflow patent,  .  Cardiovascular: deferred Respiratory: no SOB Skin:  Without evidence of facial edema, or rash Trunk:The patient's posture is erect  Neurologic exam : The patient is awake and alert, oriented to place and time.   Memory subjective described as intact.   Attention span & concentration ability appears normal.  Speech is fluent,  without  dysarthria, dysphonia or aphasia.  Mood and affect are appropriate.  Cranial nerves: Pupils apppear equal in size, EOM full, no facial asymmetry noted, vision perimetry reportedly intact. symmetric and tongue and uvula move midline. Shoulder shrug was  symmetrical.   Motor exam:  Normal muscle bulk and symmetric ROM in upper extremities. without evidence of ataxia, dysmetria or tremor.  Gait and station: Patient walks without assistive device.  I discussed the limitations of evaluation and management by telemedicine and the availability of in person appointments. The patient expressed understanding and agreed to proceed.  The patient was advised to call back or seek an in-person evaluation if the symptoms worsen or if the condition fails to improve as anticipated.  I provided 12 minutes of non-face-to-face time during this encounter.   The patient will return to the sleep lab for a "Titration to INSPIRE", I will be personally present.  Goal is to increase the hypoglossal simulation to the best tolerated and effective setting now that the device is implanted, the scars well healed and he already has noted benefit .  I suggested an appointment in the first week of June so I can be present.    Jeffery Seat, MD    Jeffery Seat, MD 0/76/2263, 3:35 AM  Certified in Neurology by ABPN Certified in Sleep Medicine by [facility] [address] [place], [place] [address]

## 2018-11-21 ENCOUNTER — Ambulatory Visit (INDEPENDENT_AMBULATORY_CARE_PROVIDER_SITE_OTHER): Payer: Medicare Other | Admitting: Neurology

## 2018-11-21 DIAGNOSIS — Z789 Other specified health status: Secondary | ICD-10-CM

## 2018-11-21 DIAGNOSIS — G4733 Obstructive sleep apnea (adult) (pediatric): Secondary | ICD-10-CM

## 2018-11-21 DIAGNOSIS — I2510 Atherosclerotic heart disease of native coronary artery without angina pectoris: Secondary | ICD-10-CM

## 2018-11-21 DIAGNOSIS — I200 Unstable angina: Secondary | ICD-10-CM

## 2018-11-22 ENCOUNTER — Telehealth: Payer: Self-pay | Admitting: Neurology

## 2018-11-22 ENCOUNTER — Other Ambulatory Visit: Payer: Self-pay | Admitting: Neurology

## 2018-11-22 DIAGNOSIS — Z789 Other specified health status: Secondary | ICD-10-CM

## 2018-11-22 DIAGNOSIS — I200 Unstable angina: Secondary | ICD-10-CM

## 2018-11-22 DIAGNOSIS — E1169 Type 2 diabetes mellitus with other specified complication: Secondary | ICD-10-CM

## 2018-11-22 DIAGNOSIS — I2510 Atherosclerotic heart disease of native coronary artery without angina pectoris: Secondary | ICD-10-CM

## 2018-11-22 DIAGNOSIS — G4734 Idiopathic sleep related nonobstructive alveolar hypoventilation: Secondary | ICD-10-CM

## 2018-11-22 DIAGNOSIS — G4733 Obstructive sleep apnea (adult) (pediatric): Secondary | ICD-10-CM

## 2018-11-22 NOTE — Progress Notes (Signed)
I retroactively ordered a INSPIRE titration study for this patient, dated yesterday 11-21-2018

## 2018-11-22 NOTE — Progress Notes (Deleted)
PATIENT'S NAME:  Jeffery, Schmidt DOB:      [birth year]      MR#:    [id number]     DATE OF RECORDING: 11/21/2018 REFERRING M.D.:             Melida Quitter, MD  Study Performed:   INSPIRE DEVICE Titration HISTORY:  Jeffery Schmidt is a 78 y.o. male patient who I had met before upon referral from ENT specialist Dr. Redmond Baseman, MD. The patient carries a diagnosis of mild mitral valve regurgitation and has a mildly dilated left atrium. After the heart catheterization he had a brief delirium.  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 Mr. Jeffery Schmidt reported that he had been previously multiple times assessed for sleep apnea (Baseline AHI was 23.4/h.) and that he started using CPAP 5 years ago but could not tolerate positive airway pressure therapy. His sleep was too interrupted, he did not feel restored or refreshed and therefore discontinued the use.  He also attempted to improve with a dental device which also has failed.  In December I saw him briefly in the sleep lab outside the regular sleep clinic appointments and he had just been implanted with an inspire device at [facility], date    The device was turned on in January 2020.  The patient reports tolerating the device very well his only concern was that he may already wore out the batteries of the remote control.   Dr. Peter Martinique is his cardiologist; Dr. Melida Quitter his ENT physician.   He has a longstanding history of known coronary artery disease diagnosed first in 1998 according to my records. By November 2015 he had a cardiac catheterization and percutaneous stent placement for LAD and left circumflex with a Biotronic device.    The patient endorsed the Epworth Sleepiness Scale at 4 points.   The patient's weight 161 pounds with a height of 67 (inches), resulting in a BMI of 25.3 kg/m2. The patient's neck circumference  measured 17 inches.  CURRENT MEDICATIONS: Norvasc, Aspirin, Plavix, Zetia, Hydrodiuril, Cozaar, Metformin, Toprol, Pravachol,   PROCEDURE:  This is a multichannel digital polysomnogram utilizing the SomnoStar 11.2 system.  Electrodes and sensors were applied and monitored per AASM Specifications.   EEG, EOG, Chin and Limb EMG, were sampled at 200 Hz.  ECG, Snore and Nasal Pressure, Thermal Airflow, Respiratory Effort, Inspire device voltage control- Oximetry was sampled at 50 Hz. Digital video and audio were recorded.     Inspire device was initiated at 2.2 volt and voltage was advanced to 2.7 because of hypopneas, UARS and desaturations. The patient could not resume sleeping after 5AM and therefor did not reach the 2.7 V setting while asleep.   At an [place] setting of 2.6 V, there was a reduction of the AHI to 5.1 with improvement of sleep apnea. Lights Out was at 21:16 and Lights On at 05:01. Total recording time (TRT) was 465 minutes, with a total sleep time (TST) of 340.5 minutes. The patient's sleep latency was 28 minutes. REM latency was 80 minutes.  The sleep efficiency was 73.2 %.    SLEEP ARCHITECTURE: WASO (Wake after sleep onset) was prolonged at 74 minutes.  There were 57 minutes in Stage N1, 65 minutes Stage N2, 141.5 minutes Stage N3 and 77 minutes in Stage REM.  The percentage of Stage N1 was 16.7%, Stage N2 was 19.1%, Stage N3 was 41.6% and Stage R (REM  sleep) was 22.6%.   RESPIRATORY ANALYSIS:  There was a total of 24 respiratory events: 0 obstructive apneas, 1 central apnea and 0 mixed apneas with a total of 1 apnea and 23 hypopneas. The patient also had 3 respiratory event related arousals (RERAs).     The total APNEA/HYPOPNEA INDEX (AHI) was 4.2 /hour and the total RESPIRATORY DISTURBANCE INDEX was 4.8 /hour. 22 events occurred in REM sleep and 2 events in NREM. The REM AHI was 17.1 /hour versus a non-REM AHI of 0.5 /hour.  The patient spent 8.5 minutes of total sleep time in the  supine position and 332 minutes in non-supine. The supine AHI was 0.0/h, versus a non-supine AHI of 4.4/h.  OXYGEN SATURATION & C02:  The baseline 02 saturation was 92%, with the lowest being 84%. Time spent below 89% saturation equaled 122 minutes. The arousals were noted as: 35 were spontaneous, 0 were associated with PLMs, and 8 were associated with respiratory events. The patient had a total of 0 Periodic Limb Movements. The Periodic Limb Movement (PLM) index was 0 and the PLM Arousal index was 0 /hour.  Audio and video analysis did not show any abnormal or unusual movements, behaviors, phonations or vocalizations.  Only mild Snoring was noted. EKG was in keeping with normal sinus rhythm (NSR). Post-study, the patient indicated that sleep was the same as usual.   DIAGNOSIS 1. Obstructive Sleep Apnea improved under Inspire use ( AHI to 4.2/h), but REM apneas were still noted, AHI was 17.1/h in REM . 2. 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   3. 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    PLANS/RECOMMENDATIONS: 1. Follow-up with Dr. Brett Fairy and INSPIRE technologist in 8-21 days. We will adjust the device to a setting above the current one ( 2.4 V)  2. Educate patient in sleep hygiene measures.   3. Maintain lean body weight. 4. The patient should avoid evening sedatives, hypnotics, and alcohol beverage consumption  A follow up appointment will be scheduled in the Sleep Clinic at [facility].   Please call [phone] with any questions. I certify that I have reviewed the entire raw data recording prior to the issuance of this report in accordance with the Standards of Accreditation of the American Academy of Sleep Medicine (AASM)  Larey Seat, M.D. 11-22-2018 Diplomat, American Board of Psychiatry and Neurology  Diplomat, [place] of Sleep Medicine Medical Director, [place] [facility] at [facility]

## 2018-11-22 NOTE — Procedures (Signed)
PATIENT'S NAME:  Jeffery Schmidt, Jeffery Schmidt DOB:      12-Jul-1940      MR#:    [id number]     DATE OF RECORDING: 11/21/2018 REFERRING M.D.:             Melida Quitter, MD  Study Performed:   INSPIRE DEVICE TITRATION HISTORY:  VERNIE Schmidt is a 78 y.o. male patient who I had met before upon referral from ENT specialist Dr. Redmond Baseman, MD. The patient carries a diagnosis of mild mitral valve regurgitation and has a mildly dilated left atrium. After the heart catheterization he had a brief delirium.  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 [name] reported that he had been previously multiple times assessed for sleep apnea (Baseline AHI was 23.4/h.) and that he started using CPAP 5 years ago but could not tolerate positive airway pressure therapy. His sleep was too interrupted, he did not feel restored or refreshed and therefore discontinued the use.  He also attempted to improve with a dental device which also has failed.  In December I saw him briefly in the sleep lab outside the regular sleep clinic appointments and he had just been implanted with an inspire device at [facility], date    The device was turned on in January 2020.  The patient reports tolerating the device very well his only concern was that he may already wore out the batteries of the remote control.   Dr. Peter Martinique is his cardiologist; Dr. Melida Quitter his ENT physician.   He has a longstanding history of known coronary artery disease diagnosed first in 1998 according to my records. By November 2015 he had a cardiac catheterization and percutaneous stent placement for LAD and left circumflex with a Biotronic device.    The patient endorsed the Epworth Sleepiness Scale at 4 points.   The patient's weight 161 pounds with a height of 67 (inches), resulting in a BMI of 25.3 kg/m2. The patient's neck circumference  measured 17 inches.  CURRENT MEDICATIONS: Norvasc, Aspirin, Plavix, Zetia, Hydrodiuril, Cozaar, Metformin, Toprol, Pravachol,   PROCEDURE:  This is a multichannel digital polysomnogram utilizing the SomnoStar 11.2 system.  Electrodes and sensors were applied and monitored per AASM Specifications.   EEG, EOG, Chin and Limb EMG, were sampled at 200 Hz.  ECG, Snore and Nasal Pressure, Thermal Airflow, Respiratory Effort, Inspire device voltage control- Oximetry was sampled at 50 Hz. Digital video and audio were recorded.     Inspire device was initiated at 2.2 volt and voltage was advanced to 2.7 because of hypopneas, UARS and desaturations. The patient could not resume sleeping after 5AM and therefor did not reach the 2.7 V setting while asleep.   At an [place] setting of 2.6 V, there was a reduction of the AHI to 5.1 with improvement of sleep apnea. Lights Out was at 21:16 and Lights On at 05:01. Total recording time (TRT) was 465 minutes, with a total sleep time (TST) of 340.5 minutes. The patient's sleep latency was 28 minutes. REM latency was 80 minutes.  The sleep efficiency was 73.2 %.    SLEEP ARCHITECTURE: WASO (Wake after sleep onset) was prolonged at 74 minutes.  There were 57 minutes in Stage N1, 65 minutes Stage N2, 141.5 minutes Stage N3 and 77 minutes in Stage REM.  The percentage of Stage N1 was 16.7%, Stage N2 was 19.1%, Stage N3 was 41.6% and Stage R (REM  sleep) was 22.6%.   RESPIRATORY ANALYSIS:  There was a total of 24 respiratory events: 0 obstructive apneas, 1 central apnea and 0 mixed apneas with a total of 1 apnea and 23 hypopneas. The patient also had 3 respiratory event related arousals (RERAs).     The total APNEA/HYPOPNEA INDEX (AHI) was 4.2 /hour and the total RESPIRATORY DISTURBANCE INDEX was 4.8 /hour. 22 events occurred in REM sleep and 2 events in NREM. The REM AHI was 17.1 /hour versus a non-REM AHI of 0.5 /hour.  The patient spent 8.5 minutes of total sleep time in the  supine position and 332 minutes in non-supine. The supine AHI was 0.0/h, versus a non-supine AHI of 4.4/h.  OXYGEN SATURATION & C02:  The baseline 02 saturation was 92%, with the lowest being 84%. Time spent below 89% saturation equaled 122 minutes. The arousals were noted as: 35 were spontaneous, 0 were associated with PLMs, and 8 were associated with respiratory events. The patient had a total of 0 Periodic Limb Movements. The Periodic Limb Movement (PLM) index was 0 and the PLM Arousal index was 0 /hour.  Audio and video analysis did not show any abnormal or unusual movements, behaviors, phonations or vocalizations.  Only mild Snoring was noted. EKG was in keeping with normal sinus rhythm (NSR). Post-study, the patient indicated that sleep was the same as usual.   DIAGNOSIS 1. Obstructive Sleep Apnea improved under Inspire use (AHI to 4.2/h), but REM apneas were still noted, AHI was 17.1/h in REM. 2. 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   3. 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    PLANS/RECOMMENDATIONS: 1. Follow-up with Dr. Brett Fairy and INSPIRE technologist in 8-21 days. We will adjust the device to a setting above the current one ( 2.4 V)  2. Educate patient in sleep hygiene measures.   3. Maintain lean body weight. 4. The patient should avoid evening sedatives, hypnotics, and alcohol beverage consumption  A follow up appointment will be scheduled in the Sleep Clinic at [facility].   Please call [phone] with any questions. I certify that I have reviewed the entire raw data recording prior to the issuance of this report in accordance with the Standards of Accreditation of the American Academy of Sleep Medicine (AASM)  Larey Seat, M.D. 11-22-2018 Diplomat, American Board of Psychiatry and Neurology  Diplomat, [place] of Sleep Medicine Medical Director, [place] [facility] at [facility]

## 2018-11-22 NOTE — Telephone Encounter (Signed)
Pt is scheduled to come in 6/11 at 4:00 pm  To discuss and make changes as necessary

## 2018-11-22 NOTE — Telephone Encounter (Signed)
-----   Message from Larey Seat, MD sent at 11/22/2018 11:41 AM EDT ----- Incomplete titration on INSPIRE - Device but indication that the patient needs 2.6 V or higher voltage. There was still hypoxemia - and some REM sleep dependent apneas persist, but overall AHI reduced by approx. 80%.  DIAGNOSIS 1. Obstructive Sleep Apnea improved under Inspire use (AHI to  4.2/h), but REM apneas were still noted, AHI was 17.1/h in REM. 2. 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  3. 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    PLANS/RECOMMENDATIONS: 1. Follow-up with Dr. Brett Fairy and INSPIRE technologist in 8-21  days. We will adjust the device to a setting above the current  one ( 2.4 V)

## 2018-12-07 ENCOUNTER — Ambulatory Visit (INDEPENDENT_AMBULATORY_CARE_PROVIDER_SITE_OTHER): Payer: Medicare Other | Admitting: Neurology

## 2018-12-07 ENCOUNTER — Other Ambulatory Visit: Payer: Self-pay

## 2018-12-07 ENCOUNTER — Encounter: Payer: Self-pay | Admitting: Neurology

## 2018-12-07 VITALS — BP 127/69 | HR 58 | Temp 96.9°F | Ht 66.75 in | Wt 165.4 lb

## 2018-12-07 DIAGNOSIS — I2510 Atherosclerotic heart disease of native coronary artery without angina pectoris: Secondary | ICD-10-CM | POA: Diagnosis not present

## 2018-12-07 DIAGNOSIS — Z789 Other specified health status: Secondary | ICD-10-CM

## 2018-12-07 DIAGNOSIS — I200 Unstable angina: Secondary | ICD-10-CM

## 2018-12-07 DIAGNOSIS — G4733 Obstructive sleep apnea (adult) (pediatric): Secondary | ICD-10-CM | POA: Diagnosis not present

## 2018-12-07 NOTE — Progress Notes (Signed)
SLEEP MEDICINE CLINIC   Provider:  Larey Schmidt, M D  Primary Care Physician:  Jeffery Erp, MD   Referring Provider: Levin Erp, MD      Interval history : Jeffery Schmidt is a 78 y.o. male patient  , whom I had met before upon referral from ENT specialist Dr Jeffery Baseman, MD. patient was here overnight for the titration of Inspire- has not reached full potential.    He woke up at 4 AM and couldn't finish the titration. In the meantime he felt benefits form Inspire and is  less sleepy. The total APNEA/HYPOPNEA INDEX (AHI) was 4.2 /hour and the total RESPIRATORY DISTURBANCE INDEX was 4.8 /hour. 22 events occurred in REM sleep and 2 events in NREM. The REM AHI was 17.1 /hour versus a non-REM AHI of 0.5 /hour.  The patient spent 8.5 minutes of total sleep time in the supine position and 332 minutes in non-supine. The supine AHI was 0.0/h, versus a non-supine AHI of 4.4/h.  OXYGEN SATURATION & C02:  The baseline 02 saturation was 92%, with the lowest being 84%. Time spent below 89% saturation equaled 122 minutes. The arousals were noted as: 35 were spontaneous, 0 were associated with PLMs, and 8 were associated with respiratory events. The patient had a total of 0 Periodic Limb Movements. The Periodic Limb Movement (PLM) index was 0 and the PLM Arousal index was 0 /hour.  Audio and video analysis did not show any abnormal or unusual movements, behaviors, phonations or vocalizations.  Only mild Snoring was noted. EKG was in keeping with normal sinus rhythm (NSR). Post-study, the patient indicated that sleep was the same as usual.   DIAGNOSIS 1. Obstructive Sleep Apnea improved under Inspire use (AHI to 4.2/h), but REM apneas were still noted, AHI was 17.1/h in REM. 2. 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   3. 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    PLANS/RECOMMENDATIONS: 1. Follow-up with Jeffery Schmidt and  INSPIRE technologist in 8-21 days. We will adjust the device to a setting above the current one ( 2.4 V)  2. Educate patient in sleep hygiene measures.   3. Maintain lean body weight. 4. The patient should avoid evening sedatives, hypnotics, and alcohol beverage consumption..     We are now meeting to see if higher voltage will help to reduce the AHI further - and may be improving hypoxia and REM AHI . The patient will be followed by a HST in 4-6 weeks to check for hypoxemia.; Today's December 07, 2018 4 PM the patient's adjusted stimulation will go from 2.2 V stimulation to 2.7 today. Level 6. The patient also has used his machine for instrument 45 hours/week on average current total use of usage is 96 hours.  Amplitude involved is 2.4, start delay is 20 minutes, pulse time 15 minutes therapy duration per night 8 hours, sensing exhalation was -4 off.  38% inverted signal is off.  Plan to perform HST in 4-6  Weeks.   Jeffery Seat, MD       Addendum - HST ordered.    PS : His daughter died of Covid 34 he learnt , he was infected, too. Tested AB  Poitive.  HPI:  Jeffery Schmidt is a 78 y.o. male patient  , whom I had met before upon referral from ENT specialist Dr Jeffery Baseman, MD.  Jeffery Schmidt reported that he had  been previously multiple times assessed for sleep apnea he started using CPAP 5 years ago but could not tolerate positive airway pressure therapy.  His sleep was too interrupted he did not feel restored or refreshed and therefore discontinued the use.  He also attempted to improve with a dental device which also has failed.  In December I saw him briefly in the sleep lab outside the regular sleep clinic appointments and he had just been implanted with an inspire device.  The device was turned on in January 2020.  The patient reports tolerating the device very well his only concern was that he had already wore out the batteries of the remote control.   Sleep and medical history; see  above. Dr. Peter Schmidt is his cardiologist; Dr. Melida Schmidt his ENT physician.   He has a longstanding history of known coronary artery disease diagnosed first in 1998 according to my records.  In 2014 he had an ejection fraction of 68 no signs of ischemia in November 2015 he had a cardiac catheterization and percutaneous stent placement.  LAD and left circumflex were stented right coronary artery, with a Biotronik posterior device.  The patient carries a diagnosis of mild mitral valve regurgitation and a mildly dilated  left atrium.  After the heart catheterization he had a brief delirium.  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  I reviewed the patient's current medications in detail. OR note reviewed. Cardiac visit note reviewed.  Family sleep history: mother with HTN, CAD and CVA   Social history:  Very remote smoking history, never more than 5 a day. Caffeine occasionally, ETOH : none.  Sleep habits are as follows: Bedtime is between 10-11 Pm and going to sleep is not a problem. With the inspire device he has been snoring less and sleeping deeper, feels refreshed and restored, averaging 7 hours of sleep with fewer nocturia.     Review of Systems: Out of a complete 14 system review, the patient complains of only the following symptoms, and all other reviewed systems are negative.   How likely are you to doze in the following situations: 0 = not likely, 1 = slight chance, 2 = moderate chance, 3 = high chance  Sitting and Reading? Watching Television? Sitting inactive in a public place (theater or meeting)? Lying down in the afternoon when circumstances permit? Sitting and talking to someone? Sitting quietly after lunch without alcohol? In a car, while stopped for a few minutes in traffic? As a passenger in a car for an hour without a break?   Total = 6     Social History   Socioeconomic History  . Marital status: Married    Spouse name: Jeffery Schmidt  . Number of children: 6  . Years of education: Not on file  . Highest education level: Not on file  Occupational History  . Occupation: retired  Scientific laboratory technician  . Financial resource strain: Not on file  . Food insecurity    Worry: Not on file    Inability: Not on file  . Transportation needs    Medical: Not on file    Non-medical: Not on file  Tobacco Use  . Smoking status: Former Research scientist (life sciences)  . Smokeless tobacco: Never Used  . Tobacco comment: "smoked cigarettes a thousand years ago; maybe a cigarette qod for a time"  Substance and Sexual Activity  . Alcohol use: No  . Drug use: No  .  Sexual activity: Not Currently  Lifestyle  . Physical activity    Days per week: Not on file    Minutes per session: Not on file  . Stress: Not on file  Relationships  . Social Herbalist on phone: Not on file    Gets together: Not on file    Attends religious service: Not on file    Active member of club or organization: Not on file    Attends meetings of clubs or organizations: Not on file    Relationship status: Not on file  . Intimate partner violence    Fear of current or ex partner: Not on file    Emotionally abused: Not on file    Physically abused: Not on file    Forced sexual activity: Not on file  Other Topics Concern  . Not on file  Social History Narrative  . Not on file    Family History  Problem Relation Age of Onset  . Hypertension Mother   . Coronary artery disease Other   . Heart disease Other   . Hypertension Other   . Stroke Other   . Other Father        unknown    Past Medical History:  Diagnosis Date  . Blood transfusion without reported diagnosis   . CAD (coronary artery disease)    a. 1998 s/p BMS to LCX;  b. 12/2012 MV: EF 68%, no ischemia;  c. 04/2014 Cath/PCI: LM nl, LAD 90p (3.0x18 Biotronik Orsiro DES - 3.5), LCX 40m patent stent,  RCA  90 (2.5x18 Biotronik Orsiro DES - 2.75)  . Complication of anesthesia    anesthesia delirium after heart cath   . [place]   . Diastolic dysfunction    a. 04/2014 Echo: EF 60-65%, no rwma, Gr1 DD, mild MR, mildly dil LA.  .Marland KitchenHistory of kidney stones    had nephrostomy for this about 20 years ago  . HLD (hyperlipidemia)   . HTN (hypertension)   . Kidney stones   . Obstructive sleep apnea    a. on CPAP  . Sleep apnea     Past Surgical History:  Procedure Laterality Date  . BLEPHAROPLASTY Bilateral 2000's  . COLONOSCOPY    . CORONARY ANGIOPLASTY  1998  . CORONARY ANGIOPLASTY WITH STENT PLACEMENT  05/07/14   stents to LAD and RCA, patent LCX stent   . DRUG INDUCED ENDOSCOPY N/A 02/22/2018   Procedure: DRUG INDUCED ENDOSCOPY;  Surgeon: BMelida Quitter MD;  [place] [facility]  Service: ENT;  Laterality: N/A;  . LEFT HEART CATHETERIZATION WITH CORONARY ANGIOGRAM N/A 05/07/2014   Procedure: LEFT HEART CATHETERIZATION WITH CORONARY ANGIOGRAM;  Surgeon: Jeffery M JMartinique MD;  [facility];  Service: Cardiovascular;  Laterality: N/A;  . LITHOTRIPSY  1990's  . NEPHROSTOMY  1990's  . PERCUTANEOUS CORONARY STENT INTERVENTION (PCI-S)  05/07/2014   Procedure: PERCUTANEOUS CORONARY STENT INTERVENTION (PCI-S);  Surgeon: Jeffery M JMartinique MD;  [facility];  Service: Cardiovascular;;  Drug eluting stent Prox LAD (3.0/18 Orsiro) and drug eluting stent to Mid RCA (2.5/18 Orsiro)    Current Outpatient Medications  Medication Sig Dispense Refill  . amLODipine (NORVASC) 10 MG tablet Take 10 mg by mouth daily.    . Ascorbic Acid (VITAMIN C PO) Take 500 mg by mouth daily.     .Marland Kitchenaspirin 81 MG tablet Take 81 mg by mouth daily.    . clopidogrel (PLAVIX) 75 MG  tablet Take 1 tablet (75 mg total) by mouth daily. 30 tablet 11  . Coenzyme Q10 (COQ10 PO) Take 1 capsule by mouth daily.     [name] etodolac (LODINE) 400 MG tablet Take 400 mg by mouth  daily as needed for mild pain.    [name] ezetimibe (ZETIA) 10 MG tablet Take 10 mg by mouth daily.    . Glucos-Chondroit-Hyaluron-MSM (GLUCOSAMINE CHONDROITIN JOINT PO) Take 1 tablet by mouth daily.    . hydrochlorothiazide (HYDRODIURIL) 25 MG tablet Take 25 mg by mouth daily.    [name] losartan (COZAAR) 100 MG tablet Take 100 mg by mouth daily.    . metFORMIN (GLUCOPHAGE) 500 MG tablet Take 500 mg by mouth daily with breakfast.     . metoprolol succinate (TOPROL-XL) 100 MG 24 hr tablet Take 100 mg by mouth daily.     . Omega-3 Fatty Acids (FISH OIL) 1200 MG CAPS Take 1,200 mg by mouth daily.    . pravastatin (PRAVACHOL) 20 MG tablet Take 10 mg by mouth daily.    [name] VITAMIN D PO Take 400 Units by mouth daily.      No current facility-administered medications for this visit.     Allergies as of 12/07/2018 - Review Complete 12/07/2018  Allergen Reaction Noted  . Brilinta [ticagrelor] Shortness Of Breath 06/30/2016  . Epinephrine Anaphylaxis and Other (See Comments) 10/19/2011    Vitals: BP 127/69   Pulse (!) 58   Temp (!) 96.9 F (36.1 C) (Oral) Comment (Src): temp was taken by check in staff  Ht 5' 6.75" (1.695 m)   Wt 165 lb 6.4 oz (75 kg)   BMI 26.10 kg/m  Last Weight:  Wt Readings from Last 1 Encounters:  12/07/18 165 lb 6.4 oz (75 kg)   IWL:NLGX mass index is 26.1 kg/m.       Last Height:   Ht Readings from Last 1 Encounters:  12/07/18 5' 6.75" (1.695 m)    Physical exam:  General: The patient is awake, alert and appears not in acute distress. The patient is well groomed. Head: Normocephalic, atraumatic. Neck is supple. Mallampati 3 ,  neck circumference:17"  Nasal airflow patent,  .  Cardiovascular: deferred Respiratory: no SOB Skin:  Without evidence of facial edema, or rash Trunk:The patient's posture is erect  Neurologic exam : The patient is awake and alert, oriented to place and time.   Memory subjective described as intact.   Attention span & concentration ability  appears normal.  Speech is fluent,  without  dysarthria, dysphonia or aphasia.  Mood and affect are appropriate.  Cranial nerves: Pupils apppear equal in size, EOM full, no facial asymmetry noted, vision perimetry reportedly intact. symmetric and tongue and uvula move midline. Shoulder shrug was symmetrical.   Motor exam:  Normal muscle bulk and symmetric ROM in upper extremities. without evidence of ataxia, dysmetria or tremor.  Gait and station: Patient walks without assistive device.  I discussed the limitations of evaluation and management by telemedicine and the availability of in person appointments. The patient expressed understanding and agreed to proceed.  The patient was advised to call back or seek an in-person evaluation if the symptoms worsen or if the condition fails to improve as anticipated.  I provided 20 minutes of non-face-to-face time during this encounter   We are now meeting to see if higher voltage will help to reduce the [facility] further - and may be improving hypoxia and REM AHI . The patient will be followed by a HST  in 4-6 weeks to check for hypoxemia.  "Titration to INSPIRE", I was personally present.  Goal is to increase the hypoglossal simulation to the best tolerated and effective setting now that the device is implanted, the scars well healed and he already has noted benefit .  I suggested an appointment in the first week of July so I can be present. HST to be  performed.    Jeffery Seat, MD    Jeffery Seat, MD 6/65/9935, 7:01 PM  Certified in Neurology by ABPN Certified in Sleep Medicine by [facility] [address] [place], [place] [address]              PATIENT'S NAME:  Jeffery, Schmidt DOB:      Jun 28, 1941      MR#:    [id number]     DATE OF RECORDING: 11/21/2018 REFERRING M.D.:             Jeffery Quitter, MD  Study Performed:   INSPIRE DEVICE TITRATION HISTORY:  Jeffery Schmidt is a 79 y.o. male  patient who I had met before upon referral from ENT specialist Dr. Redmond Baseman, MD. The patient carries a diagnosis of mild mitral valve regurgitation and has a mildly dilated left atrium. After the heart catheterization he had a brief delirium.  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 Jeffery Schmidt reported that he had been previously multiple times assessed for sleep apnea (Baseline AHI was 23.4/h.) and that he started using CPAP 5 years ago but could not tolerate positive airway pressure therapy. His sleep was too interrupted, he did not feel restored or refreshed and therefore discontinued the use.  He also attempted to improve with a dental device which also has failed.  In December I saw him briefly in the sleep lab outside the regular sleep clinic appointments and he had just been implanted with an inspire device [facility], date    The device was turned on in January 2020.  The patient reports tolerating the device very well his only concern was that he may already wore out the batteries of the remote control.   Dr. Peter Schmidt is his cardiologist; Dr. Melida Schmidt his ENT physician.   He has a longstanding history of known coronary artery disease diagnosed first in 1998 according to my records. By November 2015 he had a cardiac catheterization and percutaneous stent placement for LAD and left circumflex with a Biotronic device.    The patient endorsed the Epworth Sleepiness Scale at 4 points.   The patient's weight 161 pounds with a height of 67 (inches), resulting in a BMI of 25.3 kg/m2. The patient's neck circumference measured 17 inches.  CURRENT MEDICATIONS: Norvasc, Aspirin, Plavix, Zetia, Hydrodiuril, Cozaar, Metformin, Toprol, Pravachol,   PROCEDURE:  This is a multichannel digital polysomnogram utilizing the SomnoStar 11.2 system.  Electrodes and sensors  were applied and monitored per AASM Specifications.   EEG, EOG, Chin and Limb EMG, were sampled at 200 Hz.  ECG, Snore and Nasal Pressure, Thermal Airflow, Respiratory Effort, Inspire device voltage control- Oximetry was sampled at 50 Hz. Digital video and audio were recorded.     Inspire device was initiated at 2.2 volt and voltage was advanced to 2.7 because of hypopneas, UARS and desaturations. The patient could not resume sleeping after 5AM and therefor did not reach the 2.7 V setting while asleep.   At an  Inspire setting of 2.6 V, there was a reduction of the AHI to 5.1 with improvement of sleep apnea. Lights Out was at 21:16 and Lights On at 05:01. Total recording time (TRT) was 465 minutes, with a total sleep time (TST) of 340.5 minutes. The patient's sleep latency was 28 minutes. REM latency was 80 minutes.  The sleep efficiency was 73.2 %.    SLEEP ARCHITECTURE: WASO (Wake after sleep onset) was prolonged at 74 minutes.  There were 57 minutes in Stage N1, 65 minutes Stage N2, 141.5 minutes Stage N3 and 77 minutes in Stage REM.  The percentage of Stage N1 was 16.7%, Stage N2 was 19.1%, Stage N3 was 41.6% and Stage R (REM sleep) was 22.6%.   RESPIRATORY ANALYSIS:  There was a total of 24 respiratory events: 0 obstructive apneas, 1 central apnea and 0 mixed apneas with a total of 1 apnea and 23 hypopneas. The patient also had 3 respiratory event related arousals (RERAs).     The total APNEA/HYPOPNEA INDEX (AHI) was 4.2 /hour and the total RESPIRATORY DISTURBANCE INDEX was 4.8 /hour. 22 events occurred in REM sleep and 2 events in NREM. The REM AHI was 17.1 /hour versus a non-REM AHI of 0.5 /hour.  The patient spent 8.5 minutes of total sleep time in the supine position and 332 minutes in non-supine. The supine AHI was 0.0/h, versus a non-supine AHI of 4.4/h.  OXYGEN SATURATION & C02:  The baseline 02 saturation was 92%, with the lowest being 84%. Time spent below 89% saturation equaled 122  minutes. The arousals were noted as: 35 were spontaneous, 0 were associated with PLMs, and 8 were associated with respiratory events. The patient had a total of 0 Periodic Limb Movements. The Periodic Limb Movement (PLM) index was 0 and the PLM Arousal index was 0 /hour.  Audio and video analysis did not show any abnormal or unusual movements, behaviors, phonations or vocalizations.  Only mild Snoring was noted. EKG was in keeping with normal sinus rhythm (NSR). Post-study, the patient indicated that sleep was the same as usual.   DIAGNOSIS 4. Obstructive Sleep Apnea improved under Inspire use (AHI to 4.2/h), but REM apneas were still noted, AHI was 17.1/h in REM. 5. 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   6. 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    PLANS/RECOMMENDATIONS: 5. Follow-up with Jeffery Schmidt and INSPIRE technologist in 8-21 days. We will adjust the device to a setting above the current one ( 2.4 V)  6. Educate patient in sleep hygiene measures.   7. Maintain lean body weight. 8. The patient should avoid evening sedatives, hypnotics, and alcohol beverage consumption  A follow up appointment will be scheduled in the Sleep Clinic at [facility].   Please call [phone] with any questions. I certify that I have reviewed the entire raw data recording prior to the issuance of this report in accordance with the Standards of Accreditation of the American Academy of Sleep Medicine (AASM)  Jeffery Schmidt, M.D. 11-22-2018 Diplomat, American Board of Psychiatry and Neurology  Diplomat, [place] of Sleep Medicine Medical Director, [place] [facility] at [facility]

## 2019-01-29 ENCOUNTER — Ambulatory Visit (INDEPENDENT_AMBULATORY_CARE_PROVIDER_SITE_OTHER): Payer: Medicare Other | Admitting: Neurology

## 2019-01-29 ENCOUNTER — Other Ambulatory Visit: Payer: Self-pay | Admitting: Internal Medicine

## 2019-01-29 ENCOUNTER — Ambulatory Visit
Admission: RE | Admit: 2019-01-29 | Discharge: 2019-01-29 | Disposition: A | Discharge status: Home / Self Care | Payer: Medicare Other | Source: Ambulatory Visit | Attending: Internal Medicine | Admitting: Internal Medicine

## 2019-01-29 DIAGNOSIS — R52 Pain, unspecified: Secondary | ICD-10-CM

## 2019-01-29 DIAGNOSIS — I200 Unstable angina: Secondary | ICD-10-CM

## 2019-01-29 DIAGNOSIS — G4733 Obstructive sleep apnea (adult) (pediatric): Secondary | ICD-10-CM | POA: Diagnosis not present

## 2019-01-29 DIAGNOSIS — Z789 Other specified health status: Secondary | ICD-10-CM

## 2019-01-29 DIAGNOSIS — R94130 Abnormal response to nerve stimulation, unspecified: Secondary | ICD-10-CM

## 2019-01-29 DIAGNOSIS — I2510 Atherosclerotic heart disease of native coronary artery without angina pectoris: Secondary | ICD-10-CM

## 2019-02-12 NOTE — Procedures (Signed)
Patient Information     First Name: Jeffery Last Name: Schmidt ID: [id number]  Birth Date: 1941-06-01 Age: 78 Gender: Male  Referring Provider: Levin Erp, MD BMI: 26.6 (W=165 lb, H=5' 6'')  Neck Circ.:  17" Epworth: 4/24  Sleep Study Information    Study Date: Jan 29, 2019 S/H/A Version: 001.001.001.001 / 4.1.1528 / 77  History:    Interval history: Jeffery Schmidt is a 78 y.o. male patient, whom I had met before upon referral from ENT specialist Dr Redmond Baseman, MD. Patient was here overnight for the titration of Inspire- has not reached full potential.  He woke up at 4 AM and couldn't finish the titration. In the meantime he felt benefits form Inspire and is less sleepy. The total APNEA/HYPOPNEA INDEX (AHI) was 4.2 /hour and the total RESPIRATORY DISTURBANCE INDEX was 4.8 /hour. 22 events occurred in REM sleep and 2 events in NREM. The REM AHI was 17.1 /hour versus a non-REM AHI of 0.5 /hour. The patient spent 8.5 minutes of total sleep time in the supine position and 332 minutes in non-supine. The supine AHI was 0.0/h, versus a non-supine AHI of 4.4/h.  Summary & Diagnosis:     This Inspire patient's HST was according to the patient not representative of his usual sleep.  The patient had hand pain making it difficult to tolerate the finger probe. We also suspect that he spent more time in supine sleep (that is what the HST device tells [place]). Thus developing a different apnea pattern by position.   Recommendations:      I will ask for a repeated sleep study to titrate the device fully. We ordered a repeat HST. I will contact the inspire technologist to increase the settings on the device before repeating this study.   Physician Name: Larey Seat, MD               Sleep Summary  Oxygen Saturation Statistics   Start Study Time: End Study Time: Total Recording Time:  9:26:39 PM 6:18:03 AM    8 h, 51 min  Total Sleep Time % REM of Sleep Time:  7 h, 21 min 13.6    Mean: 93  Minimum: 87 Maximum: 99  Mean of Desaturations Nadirs (%):   91  Oxygen Desaturation. %:   4-9 10-20 >20 Total  Events Number Total   237  1 99.6 0.4  0 0.0  238 100.0  Oxygen Saturation: <90 <=88 <85 <80 <70  Duration (minutes): Sleep % 0.8 0.2  0.1 0.0  0.0 0.0 0.0 0.0 0.0 0.0     Respiratory Indices      Total Events REM NREM All Night  pRDI:  364 pAHI:  363 ODI:  238 pAHIc:  57 % CSR: 0.0 49.8 49.8 27.4 5.1 50.0 49.8 33.5 8.3 49.9 49.8 32.7 7.8       Pulse Rate Statistics during Sleep (BPM)      Mean: 49 Minimum: 36 Maximum:  70    Indices are calculated using technically valid sleep time of  7 hrs, 17 min. pRDI/pAHI are calculated using oxi desaturations ? 3%  Body Position Statistics  Position Supine Prone Right Left Non-Supine  Sleep (min) 339.4 0.0 28.5 73.5 102.0  Sleep % 76.9 0.0 6.5 16.7 23.1  pRDI 52.3 N/A 27.7 47.7 42.1  pAHI 52.1 N/A 27.7 47.7 42.1  ODI 36.8 N/A 6.4 23.8 19.0     Snoring Statistics Snoring Level (dB) >40 >50 >60 >70 >80 >Threshold (45)  Sleep (min) 67.6 8.8 2.8 0.0 0.0 19.5  Sleep % 15.3 2.0 0.6 0.0 0.0 4.4    Mean: 41 dB Sleep Stages Chart                                                                            pAHI=49.8                                                                                        Mild              Moderate                    Severe                                                 5              15                    30

## 2019-02-12 NOTE — Addendum Note (Signed)
Addended by: Larey Seat on: 02/12/2019 12:09 PM   Modules accepted: Orders

## 2019-02-13 ENCOUNTER — Telehealth: Payer: Self-pay

## 2019-02-13 ENCOUNTER — Other Ambulatory Visit: Payer: Self-pay | Admitting: Neurology

## 2019-02-13 DIAGNOSIS — G4733 Obstructive sleep apnea (adult) (pediatric): Secondary | ICD-10-CM

## 2019-02-13 NOTE — Telephone Encounter (Signed)
I need an order for In lab psg for pt. I have cancelled out HST order. Thanks!

## 2019-02-13 NOTE — Telephone Encounter (Signed)
Order placed

## 2019-02-26 ENCOUNTER — Encounter: Payer: Self-pay | Admitting: *Deleted

## 2019-02-26 DIAGNOSIS — Z006 Encounter for examination for normal comparison and control in clinical research program: Secondary | ICD-10-CM

## 2019-02-26 NOTE — Research (Signed)
BIOFLOW-V Stent research study 5 year telephone follow up completed. Patient states he is doing well and has not had any medication changes or hopitalizations since last telephone follow up. He denies any chest pain or any other adverse events. I informed him the Adalberto Cole has been approved. I thanked him for his participation in the research study. This concludes the follow up for the BIOFLOW V Study. Con medications are as follows: Current Outpatient Medications on File Prior to Visit  Medication Sig Dispense Refill  . amLODipine (NORVASC) 10 MG tablet Take 10 mg by mouth daily.    . Ascorbic Acid (VITAMIN C PO) Take 500 mg by mouth daily.     [name] aspirin 81 MG tablet Take 81 mg by mouth daily.    . clopidogrel (PLAVIX) 75 MG tablet Take 1 tablet (75 mg total) by mouth daily. 30 tablet 11  . Coenzyme Q10 (COQ10 PO) Take 1 capsule by mouth daily.     [name] etodolac (LODINE) 400 MG tablet Take 400 mg by mouth daily as needed for mild pain.    [name] ezetimibe (ZETIA) 10 MG tablet Take 10 mg by mouth daily.    . Glucos-Chondroit-Hyaluron-MSM (GLUCOSAMINE CHONDROITIN JOINT PO) Take 1 tablet by mouth daily.    . hydrochlorothiazide (HYDRODIURIL) 25 MG tablet Take 25 mg by mouth daily.    [name] losartan (COZAAR) 100 MG tablet Take 100 mg by mouth daily.    . metFORMIN (GLUCOPHAGE) 500 MG tablet Take 500 mg by mouth daily with breakfast.     . metoprolol succinate (TOPROL-XL) 100 MG 24 hr tablet Take 100 mg by mouth daily.     . Omega-3 Fatty Acids (FISH OIL) 1200 MG CAPS Take 1,200 mg by mouth daily.    . pravastatin (PRAVACHOL) 20 MG tablet Take 10 mg by mouth daily.    [name] VITAMIN D PO Take 400 Units by mouth daily.      No current facility-administered medications on file prior to visit.

## 2019-02-27 ENCOUNTER — Ambulatory Visit (INDEPENDENT_AMBULATORY_CARE_PROVIDER_SITE_OTHER): Payer: Medicare Other | Admitting: Neurology

## 2019-02-27 DIAGNOSIS — G4733 Obstructive sleep apnea (adult) (pediatric): Secondary | ICD-10-CM | POA: Diagnosis not present

## 2019-02-27 DIAGNOSIS — Z789 Other specified health status: Secondary | ICD-10-CM

## 2019-03-15 ENCOUNTER — Telehealth: Payer: Self-pay | Admitting: Neurology

## 2019-03-15 ENCOUNTER — Other Ambulatory Visit: Payer: Self-pay | Admitting: Neurology

## 2019-03-15 DIAGNOSIS — I2510 Atherosclerotic heart disease of native coronary artery without angina pectoris: Secondary | ICD-10-CM

## 2019-03-15 DIAGNOSIS — G4734 Idiopathic sleep related nonobstructive alveolar hypoventilation: Secondary | ICD-10-CM

## 2019-03-15 DIAGNOSIS — G4733 Obstructive sleep apnea (adult) (pediatric): Secondary | ICD-10-CM

## 2019-03-15 DIAGNOSIS — I200 Unstable angina: Secondary | ICD-10-CM

## 2019-03-15 NOTE — Telephone Encounter (Signed)
No need to give oxygen as long as he is avoiding sleeping on his back.

## 2019-03-15 NOTE — Telephone Encounter (Signed)
Called and advised the patient of the sleep study results. Patient advised to avoid sleeping on his back. Patient informed of the low oxygen concerns and the time spent below 89%. Advised the patient inspire will not treat the low oxygen concern but the inspire is treating the apnea at the current setting. Patient inquired on if he needs to complete anything as far as treatment for the low oxygen. He states he is not established with a pulmonologist of any sort and advised that he feels well rested and doesn't feel like its affecting his sleep. States that he is open to recommendation if he needs to address this hypoxia. Informed the patient I would address with Dr Brett Fairy and see if she recommends anything. Pt verbalized understanding and was appreciative for the call.

## 2019-03-15 NOTE — Telephone Encounter (Signed)
----- Message from Larey Seat, MD sent at 03/15/2019  1:09 PM EDT ----- PATIENT'S NAME:  Jeffery Schmidt, Jeffery Schmidt DOB:      October 26, 1940      MR#:    [id number]     DATE OF RECORDING: 02/27/2019 AL REFERRING M.D.:  Levin Erp MD Study Performed:   Titration to INSPIRE device  HISTORY:  Pt here for re-titration of his inspire device due to last HST revealing still a high residual AHI at the current settings.  The patient endorsed the Epworth Sleepiness Scale at 4 points.   The patient's weight 165 pounds with a height of 66 (inches), resulting in a BMI of 26.6 kg/m2. The patient's neck circumference measured 17 inches.  CURRENT MEDICATIONS: Norvasc, Aspirin, Plavix, Zetia, Hydroduril, Cozaar, Metformin, Toprol, Pravachol,    PROCEDURE:  This is a multichannel digital polysomnogram utilizing the SomnoStar 11.2 system.  Electrodes and sensors were applied and monitored per AASM Specifications.   EEG, EOG, Chin and Limb EMG, were sampled at 200 Hz.  ECG, Snore and Nasal Pressure, Thermal Airflow, Respiratory Effort, CPAP Flow and Pressure, Oximetry was sampled at 50 Hz. Digital video and audio were recorded.      Hypoglossal Nerve Stimulator was initiated at 2.5 V and was advanced to 2.6 V because of residual hypopneas, low oxygen saturation in REM sleep.  At a stimulus of 2.6 V, there was a reduction of the AHI to 1.8/h with improvement of sleep apnea. Interestingly, central apneas emerged under 2.8 and 2.9 V stimulation.  In lateral sleep position, the patient had overcome all AHI events after reaching 2.6 V.   Lights Out was at 22:17 and Lights On at 05:30. Total recording time (TRT) was 404 minutes, with a total sleep time (TST) of 235 minutes. The patient's sleep latency was 26 minutes. REM latency was 118 minutes.  The sleep efficiency was again only 58.2 %.    SLEEP ARCHITECTURE: WASO (Wake after sleep onset) was 181 minutes.  There were 60.5 minutes in Stage N1, 81 minutes Stage N2, 55.5 minutes  Stage N3 and 46 minutes in Stage REM.  The percentage of Stage N1 was 24.9%, Stage N2 was 33.3%, Stage N3 was 22.8% and Stage R (REM sleep) was 18.9%.   RESPIRATORY ANALYSIS:  There was a total of 85 respiratory events: 0 obstructive apneas, 38 central apneas and 18 mixed apneas with a total of 56 apneas and an apnea index (AI) of 14.3 /hour. There were 29 hypopneas with a hypopnea index of 7.4/hour. The patient also had 0 respiratory event related arousals (RERAs).     The total APNEA/HYPOPNEA INDEX  (AHI) was 21.7 /hour and the total RESPIRATORY DISTURBANCE INDEX was 21.7 /hour  7 events occurred in REM sleep and 78 events in NREM. The REM AHI was 9.1 /hour versus a non-REM AHI of 24.8 /hour.  The patient spent 197.5 minutes of total sleep time in the supine position and 46 minutes in non-supine.  The supine AHI was 26.6, versus a non-supine AHI of 1.3.  OXYGEN SATURATION & C02:  The baseline 02 saturation was 0%, with the lowest being 83%. Time spent below 89% saturation equaled 158 minutes. Mostly in supine REM sleep. The arousals were noted as: 43 were spontaneous, 0 were associated with PLMs, and 29 were associated with respiratory events. The patient had a total of 0 Periodic Limb Movements.  Audio and video analysis did not show any abnormal or unusual movements, behaviors, phonations or vocalizations. One coughing spell let  to prolonged period of wakefulness.   The patient took bathroom breaks. He also slept in a recliner for a brief period of time without significant benefit to AHI.  EKG was in keeping with normal sinus rhythm (NSR).    DIAGNOSIS 1. 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  2. Sleep Related Hypoxemia, unaffected by INSPIRE device.   PLANS/RECOMMENDATIONS: Inspire setting of 2.6 V is the goal, but response to reduction in AHI is only seen when not in SUPINE POSITION,  Underlying sleep  hypoxia remains prominent in supine and REM sleep.     DISCUSSION: 1) AHI reduction to 1.5/h is a good result, achieved at 2.6V. Stimulus by INSPIRE device 2) Avoid supine sleep.  3) Inspire cannot treat hypoxia.    A follow up appointment will be scheduled in the Sleep Clinic at [facility].   Please call [phone] with any questions.      I certify that I have reviewed the entire raw data recording prior to the issuance of this report in accordance with the Standards of Accreditation of the American Academy of Sleep Medicine (AASM)   Larey Seat, M.D.  03-15-2019 Diplomat, American Board of Psychiatry and Neurology  Diplomat, [place] of Sleep Medicine Medical Director, [place] [facility] at [facility]

## 2019-03-15 NOTE — Procedures (Signed)
PATIENT'S NAME:  Jeffery Schmidt, Jeffery Schmidt DOB:      01-21-1941      MR#:    [id number]     DATE OF RECORDING: 02/27/2019 AL REFERRING M.D.:  Levin Erp MD Study Performed:   Titration to INSPIRE device  HISTORY:  Pt here for re-titration of his inspire device due to last HST revealing still a high residual AHI at the current settings.  The patient endorsed the Epworth Sleepiness Scale at 4 points.   The patient's weight 165 pounds with a height of 66 (inches), resulting in a BMI of 26.6 kg/m2. The patient's neck circumference measured 17 inches.  CURRENT MEDICATIONS: Norvasc, Aspirin, Plavix, Zetia, Hydroduril, Cozaar, Metformin, Toprol, Pravachol,    PROCEDURE:  This is a multichannel digital polysomnogram utilizing the SomnoStar 11.2 system.  Electrodes and sensors were applied and monitored per AASM Specifications.   EEG, EOG, Chin and Limb EMG, were sampled at 200 Hz.  ECG, Snore and Nasal Pressure, Thermal Airflow, Respiratory Effort, CPAP Flow and Pressure, Oximetry was sampled at 50 Hz. Digital video and audio were recorded.      Hypoglossal Nerve Stimulator was initiated at 2.5 V and was advanced to 2.6 V because of residual hypopneas, low oxygen saturation in REM sleep.  At a stimulus of 2.6 V, there was a reduction of the AHI to 1.8/h with improvement of sleep apnea. Interestingly, central apneas emerged under 2.8 and 2.9 V stimulation.  In lateral sleep position, the patient had overcome all AHI events after reaching 2.6 V.   Lights Out was at 22:17 and Lights On at 05:30. Total recording time (TRT) was 404 minutes, with a total sleep time (TST) of 235 minutes. The patient's sleep latency was 26 minutes. REM latency was 118 minutes.  The sleep efficiency was again only 58.2 %.    SLEEP ARCHITECTURE: WASO (Wake after sleep onset) was 181 minutes.  There were 60.5 minutes in Stage N1, 81 minutes Stage N2, 55.5 minutes Stage N3 and 46 minutes in Stage REM.  The percentage of Stage N1 was 24.9%,  Stage N2 was 33.3%, Stage N3 was 22.8% and Stage R (REM sleep) was 18.9%.   RESPIRATORY ANALYSIS:  There was a total of 85 respiratory events: 0 obstructive apneas, 38 central apneas and 18 mixed apneas with a total of 56 apneas and an apnea index (AI) of 14.3 /hour. There were 29 hypopneas with a hypopnea index of 7.4/hour. The patient also had 0 respiratory event related arousals (RERAs).     The total APNEA/HYPOPNEA INDEX  (AHI) was 21.7 /hour and the total RESPIRATORY DISTURBANCE INDEX was 21.7 /hour  7 events occurred in REM sleep and 78 events in NREM. The REM AHI was 9.1 /hour versus a non-REM AHI of 24.8 /hour.  The patient spent 197.5 minutes of total sleep time in the supine position and 46 minutes in non-supine.  The supine AHI was 26.6, versus a non-supine AHI of 1.3.  OXYGEN SATURATION & C02:  The baseline 02 saturation was 0%, with the lowest being 83%. Time spent below 89% saturation equaled 158 minutes. Mostly in supine REM sleep. The arousals were noted as: 43 were spontaneous, 0 were associated with PLMs, and 29 were associated with respiratory events. The patient had a total of 0 Periodic Limb Movements.  Audio and video analysis did not show any abnormal or unusual movements, behaviors, phonations or vocalizations. One coughing spell let to prolonged period of wakefulness.   The patient took bathroom breaks. He also  slept in a recliner for a brief period of time without significant benefit to AHI.  EKG was in keeping with normal sinus rhythm (NSR).    DIAGNOSIS 1. 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  2. Sleep Related Hypoxemia, unaffected by INSPIRE device.   PLANS/RECOMMENDATIONS: Inspire setting of 2.6 V is the goal, but response to reduction in AHI is only seen when not in SUPINE POSITION,  Underlying sleep hypoxia remains prominent in supine and REM sleep.     DISCUSSION: 1) AHI  reduction to 1.5/h is a good result, achieved at 2.6V. Stimulus by INSPIRE device 2) Avoid supine sleep.  3) Inspire cannot treat hypoxia.    A follow up appointment will be scheduled in the Sleep Clinic at [facility].   Please call [phone] with any questions.      I certify that I have reviewed the entire raw data recording prior to the issuance of this report in accordance with the Standards of Accreditation of the American Academy of Sleep Medicine (AASM)   Larey Seat, M.D.  03-15-2019 Diplomat, American Board of Psychiatry and Neurology  Diplomat, [place] of Sleep Medicine Medical Director, [place] [facility] at [facility]

## 2019-03-19 ENCOUNTER — Encounter: Payer: Self-pay | Admitting: Neurology

## 2019-04-17 ENCOUNTER — Other Ambulatory Visit: Payer: Self-pay | Admitting: Internal Medicine

## 2019-05-07 ENCOUNTER — Other Ambulatory Visit: Payer: Self-pay | Admitting: Internal Medicine

## 2019-05-07 DIAGNOSIS — R1084 Generalized abdominal pain: Secondary | ICD-10-CM

## 2019-05-07 DIAGNOSIS — R079 Chest pain, unspecified: Secondary | ICD-10-CM

## 2019-05-07 DIAGNOSIS — R0602 Shortness of breath: Secondary | ICD-10-CM

## 2019-05-22 ENCOUNTER — Other Ambulatory Visit: Payer: Self-pay

## 2019-05-22 DIAGNOSIS — Z20822 Contact with and (suspected) exposure to covid-19: Secondary | ICD-10-CM

## 2019-05-23 ENCOUNTER — Other Ambulatory Visit: Payer: Medicare Other

## 2019-05-23 LAB — NOVEL CORONAVIRUS, NAA: SARS-CoV-2, NAA: NOT DETECTED

## 2019-06-01 ENCOUNTER — Other Ambulatory Visit: Payer: Self-pay

## 2019-06-01 ENCOUNTER — Ambulatory Visit
Admission: RE | Admit: 2019-06-01 | Discharge: 2019-06-01 | Disposition: A | Discharge status: Home / Self Care | Payer: Medicare Other | Source: Ambulatory Visit | Attending: Internal Medicine | Admitting: Internal Medicine

## 2019-06-01 DIAGNOSIS — R079 Chest pain, unspecified: Secondary | ICD-10-CM

## 2019-06-01 DIAGNOSIS — R1084 Generalized abdominal pain: Secondary | ICD-10-CM

## 2019-06-01 DIAGNOSIS — R0602 Shortness of breath: Secondary | ICD-10-CM

## 2019-06-01 MED ORDER — IOPAMIDOL (ISOVUE-300) INJECTION 61%
100.0000 mL | Freq: Once | INTRAVENOUS | Status: AC | PRN
  Administered 2019-06-01: 100 mL via INTRAVENOUS

## 2019-07-23 ENCOUNTER — Other Ambulatory Visit: Payer: Self-pay | Admitting: Internal Medicine

## 2019-07-23 DIAGNOSIS — N281 Cyst of kidney, acquired: Secondary | ICD-10-CM

## 2019-08-01 ENCOUNTER — Other Ambulatory Visit: Payer: Self-pay

## 2019-08-01 ENCOUNTER — Other Ambulatory Visit: Payer: Self-pay | Admitting: Internal Medicine

## 2019-08-01 ENCOUNTER — Ambulatory Visit
Admission: RE | Admit: 2019-08-01 | Discharge: 2019-08-01 | Disposition: A | Discharge status: Home / Self Care | Payer: Medicare Other | Source: Ambulatory Visit | Attending: Internal Medicine | Admitting: Internal Medicine

## 2019-08-01 DIAGNOSIS — M25559 Pain in unspecified hip: Secondary | ICD-10-CM

## 2019-08-01 DIAGNOSIS — N281 Cyst of kidney, acquired: Secondary | ICD-10-CM

## 2019-08-01 MED ORDER — IOPAMIDOL (ISOVUE-300) INJECTION 61%
100.0000 mL | Freq: Once | INTRAVENOUS | Status: AC | PRN
  Administered 2019-08-01: 11:00:00 100 mL via INTRAVENOUS

## 2019-08-06 ENCOUNTER — Ambulatory Visit: Payer: Medicare Other | Admitting: Cardiology

## 2019-09-03 ENCOUNTER — Ambulatory Visit: Payer: Medicare Other | Admitting: Cardiology

## 2019-10-02 ENCOUNTER — Other Ambulatory Visit: Payer: Self-pay | Admitting: Urology

## 2019-10-02 ENCOUNTER — Telehealth: Payer: Self-pay | Admitting: Cardiology

## 2019-10-02 NOTE — Telephone Encounter (Signed)
   Primary Cardiologist:Peter Martinique, MD  Chart reviewed as part of pre-operative protocol coverage. Because of Jeffery Schmidt's past medical history and time since last visit, he/she will require a follow-up visit in order to better assess preoperative cardiovascular risk.  Pre-op covering staff: - Please schedule appointment and call patient to inform them. - Please contact requesting surgeon's office via preferred method (i.e, phone, fax) to inform them of need for appointment prior to surgery.  Per chart review, previously cleared to hold DAPT in the past. Would be reasonable to hold again if no anginal complaints given no interval change in cardiac history.   Abigail Butts, PA-C  10/02/2019, 2:37 PM

## 2019-10-02 NOTE — Telephone Encounter (Signed)
   [facility] Pre-operative Risk Assessment    Request for surgical clearance:  1. What type of surgery is being performed? Ureteroscopy   2. When is this surgery scheduled? 10/12/2019   3. What type of clearance is required (medical clearance vs. Pharmacy clearance to hold med vs. Both)? both  4. Are there any medications that need to be held prior to surgery and how long? Asprin 5 days prior, Plavix 1 week prior  5. Practice name and name of physician performing surgery? [facility], Dr. Alexis Frock  6. What is your office phone number: [phone]   7.   What is your office fax number: [phone]  8.   Anesthesia type (None, local, MAC, general) ? general   Jeffery Schmidt 10/02/2019, 2:05 PM  _________________________________________________________________   (provider comments below)

## 2019-10-02 NOTE — Telephone Encounter (Signed)
Pt have appointment with Dr Martinique 04/09

## 2019-10-04 NOTE — Progress Notes (Signed)
Cardiology Office Note:    Date:  10/05/2019   ID:  Jeffery Schmidt, DOB January 24, 1941, MRN [id number]  PCP:  Jeffery Erp, MD  Cardiologist:  Jeffery Pondexter Martinique, MD   Referring MD: Jeffery Erp, MD   Chief Complaint  Patient presents with  . Coronary Artery Disease    History of Present Illness:    Jeffery Schmidt is a 79 y.o. male with a hx of CAD, hypertension, OSA, and hyperlipidemia.  He had a bare-metal stent placed to the left circumflex in 1998 with subsequent stenting of the LAD and RCA in November 2015.  He has a history of diabetes type 2 and chronic diastolic dysfunction.  He was  seen  on 06/30/2016 after some brief episodes of chest discomfort.  Lexiscan Myoview was ordered and showed no signs of reversible ischemia and normal EF.    He was maintained on aspirin, beta-blocker,  ARB, statin, and Zetia.  He is s/p hypoglossal nerve stimulator (Inspire) placement for OSA.   He is scheduled for ureteroscopy for a kidney stone and needs clearance.   He is doing very well. No chest pain or SOB. He is very active on his [place]. Has some seasonal allergies. No edema or palpitations. Reports BP at home well controlled at [id number] systolic. Dr Nyoka Cowden is now retired and he is seeing [name] for primary care now.     Past Medical History:  Diagnosis Date  . Blood transfusion without reported diagnosis   . CAD (coronary artery disease)    a. 1998 s/p BMS to LCX;  b. 12/2012 MV: EF 68%, no ischemia;  c. 04/2014 Cath/PCI: LM nl, LAD 90p (3.0x18 Biotronik Orsiro DES - 3.5), LCX 26m, patent stent, RCA  90 (2.5x18 Biotronik Orsiro DES - 2.75)  . Complication of anesthesia    anesthesia delirium after heart cath   . Diabetes mellitus without complication ([place])   . Diastolic dysfunction    a. 04/2014 Echo: EF 60-65%, no rwma, Gr1 DD, mild MR, mildly dil LA.  Jeffery Schmidt History of kidney stones    had nephrostomy for this about 20 years ago  . HLD (hyperlipidemia)   . HTN (hypertension)   . Kidney stones    . Obstructive sleep apnea    a. on CPAP  . Sleep apnea     Past Surgical History:  Procedure Laterality Date  . BLEPHAROPLASTY Bilateral 2000's  . COLONOSCOPY    . CORONARY ANGIOPLASTY  1998  . CORONARY ANGIOPLASTY WITH STENT PLACEMENT  05/07/14   stents to LAD and RCA, patent LCX stent   . DRUG INDUCED ENDOSCOPY N/A 02/22/2018   Procedure: DRUG INDUCED ENDOSCOPY;  Surgeon: Jeffery Quitter, MD;  Location: [facility];  Service: ENT;  Laterality: N/A;  . LEFT HEART CATHETERIZATION WITH CORONARY ANGIOGRAM N/A 05/07/2014   Procedure: LEFT HEART CATHETERIZATION WITH CORONARY ANGIOGRAM;  Surgeon: Jeffery Kestler Schmidt Martinique, MD;  Location: [facility] CATH LAB;  Service: Cardiovascular;  Laterality: N/A;  . LITHOTRIPSY  1990's  . NEPHROSTOMY  1990's  . PERCUTANEOUS CORONARY STENT INTERVENTION (PCI-S)  05/07/2014   Procedure: PERCUTANEOUS CORONARY STENT INTERVENTION (PCI-S);  Surgeon: Jeffery Lucero Schmidt Martinique, MD;  Location: [facility] CATH LAB;  Service: Cardiovascular;;  Drug eluting stent Prox LAD (3.0/18 Orsiro) and drug eluting stent to Mid RCA (2.5/18 Orsiro)    Current Medications: No outpatient medications have been marked as taking for the 10/05/19 encounter (Office Visit) with Schmidt, Micala Saltsman M, MD.     Allergies:   Brilinta [ticagrelor] and  Epinephrine   Social History   Socioeconomic History  . Marital status: Married    Spouse name: Jeffery Schmidt  . Number of children: 6  . Years of education: Not on file  . Highest education level: Not on file  Occupational History  . Occupation: retired  Tobacco Use  . Smoking status: Former Research scientist (life sciences)  . Smokeless tobacco: Never Used  . Tobacco comment: "smoked cigarettes a thousand years ago; maybe a cigarette qod for a time"  Substance and Sexual Activity  . Alcohol use: No  . Drug use: No  . Sexual activity: Not Currently  Other Topics Concern  . Not on file  Social History Narrative  . Not on file   Social Determinants of Health   Financial Resource Strain:    . Difficulty of Paying Living Expenses:   Food Insecurity:   . Worried About Charity fundraiser in the Last Year:   . Arboriculturist in the Last Year:   Transportation Needs:   . Film/video editor (Medical):   Jeffery Schmidt Lack of Transportation (Non-Medical):   Physical Activity:   . Days of Exercise per Week:   . Minutes of Exercise per Session:   Stress:   . Feeling of Stress :   Social Connections:   . Frequency of Communication with Friends and Family:   . Frequency of Social Gatherings with Friends and Family:   . Attends Religious Services:   . Active Member of Clubs or Organizations:   . Attends Archivist Meetings:   Jeffery Schmidt Marital Status:      Family History: The patient's family history includes Coronary artery disease in an other family member; Heart disease in an other family member; Hypertension in his mother and another family member; Other in his father; Stroke in an other family member.  ROS:   Please see the history of present illness.     All other systems reviewed and are negative.  EKGs/Labs/Other Studies Reviewed:    The following studies were reviewed today:  Myoview 07/02/16:  The left ventricular ejection fraction is normal (55-65%).  Nuclear stress EF: 60%.  There was no ST segment deviation noted during stress.  The study is normal.  This is a low risk study.   Normal resting and stress perfusion. No ischemia or infarction EF 60%   EKG:  EKG is  ordered today.  Sinus brady rate 52. First degree AV block. Nonspecific TWA. I have personally reviewed and interpreted this study.   Recent Labs: No results found for requested labs within last 8760 hours.  Recent Lipid Panel    Component Value Date/Time   CHOL 173 05/09/2014 0244   TRIG 222 (H) 05/09/2014 0244   HDL 36 (L) 05/09/2014 0244   CHOLHDL 4.8 05/09/2014 0244   VLDL 44 (H) 05/09/2014 0244   LDLCALC 93 05/09/2014 0244  dated 03/31/18: cholesterol 175, triglycerides 167, HDL 53,  LDL 95. A1c 6.1%. Hgb and chemistries normal.  Dated 04/03/19: CBC normal. Dated 07/17/19: cholesterol 147, triglycerides 167, HDL 42, LDL 72. A1c 6%. Creatinine 1.28. otherwise CMET normal.   Physical Exam:    VS:  BP (!) 150/72   Pulse (!) 52   Ht 5\' 6"  (1.676 Schmidt)   Wt 164 lb 3.2 oz (74.5 kg)   BMI 26.50 kg/Schmidt     Wt Readings from Last 3 Encounters:  10/05/19 164 lb 3.2 oz (74.5 kg)  12/07/18 165 lb 6.4 oz (75 kg)  06/12/18 161 lb (  73 kg)    GENERAL:  Well appearing WM in NAD HEENT:  PERRL, EOMI, sclera are clear. Oropharynx is clear. NECK:  No jugular venous distention, carotid upstroke brisk and symmetric, no bruits, no thyromegaly or adenopathy LUNGS:  Clear to auscultation bilaterally CHEST:  Unremarkable HEART:  RRR,  PMI not displaced or sustained,S1 and S2 within normal limits, no S3, no S4: no clicks, no rubs, no murmurs ABD:  Soft, nontender. BS +, no masses or bruits. No hepatomegaly, no splenomegaly EXT:  2 + pulses throughout, no edema, no cyanosis no clubbing SKIN:  Warm and dry.  No rashes NEURO:  Alert and oriented x 3. Cranial nerves II through XII intact. PSYCH:  Cognitively intact    ASSESSMENT:    1. Coronary artery disease due to lipid rich plaque   2. Essential hypertension   3. Type 2 diabetes mellitus with other specified complication, without long-term current use of insulin ([place])   4. Hypercholesterolemia    PLAN:    In order of problems listed above:  1. Coronary artery disease. S/p multiple stents in the past. Last in 2015.  Myoview in January 2018 was negative for reversible ischemia with normal LVEF.  He is asymptomatic. Recommend he stop taking Plavix now. Continue ASA long term.   2. Essential hypertension BP is well controlled per patient report, no medication changes.    3. Obstructive sleep apnea Unable to tolerate CPAP or dental appliance.  s/p placement of Inspire device implanted per ENT.OK to hold Plavix 5 days for the pr.  4.  Type 2 diabetes mellitus with other specified complication, without long-term current use of insulin ([place]) Per primary. A1c 6%. On metformin  5. Hypercholesterolemia. Intolerant of high dose statin in the past. On Zetia, pravastatin, Lovaza. LDL 72  6. Kidney stone. Cleared for urologic procedure. May hold ASA for 5 day. Plavix stopped permanently.   Follow up in one  Year.  Medication Adjustments/Labs and Tests Ordered: Current medicines are reviewed at length with the patient today.  Concerns regarding medicines are outlined above.  No orders of the defined types were placed in this encounter.  No orders of the defined types were placed in this encounter.   Signed, Whittaker Lenis Martinique, MD  10/05/2019 8:33 AM    [facility]

## 2019-10-05 ENCOUNTER — Encounter (HOSPITAL_BASED_OUTPATIENT_CLINIC_OR_DEPARTMENT_OTHER): Payer: Self-pay | Admitting: Urology

## 2019-10-05 ENCOUNTER — Other Ambulatory Visit: Payer: Self-pay

## 2019-10-05 ENCOUNTER — Ambulatory Visit (INDEPENDENT_AMBULATORY_CARE_PROVIDER_SITE_OTHER): Payer: Medicare Other | Admitting: Cardiology

## 2019-10-05 ENCOUNTER — Encounter: Payer: Self-pay | Admitting: Cardiology

## 2019-10-05 VITALS — BP 150/72 | HR 52 | Ht 66.0 in | Wt 164.2 lb

## 2019-10-05 DIAGNOSIS — I2510 Atherosclerotic heart disease of native coronary artery without angina pectoris: Secondary | ICD-10-CM | POA: Diagnosis not present

## 2019-10-05 DIAGNOSIS — E1169 Type 2 diabetes mellitus with other specified complication: Secondary | ICD-10-CM | POA: Diagnosis not present

## 2019-10-05 DIAGNOSIS — I2583 Coronary atherosclerosis due to lipid rich plaque: Secondary | ICD-10-CM | POA: Diagnosis not present

## 2019-10-05 DIAGNOSIS — I10 Essential (primary) hypertension: Secondary | ICD-10-CM | POA: Diagnosis not present

## 2019-10-05 DIAGNOSIS — E7800 Pure hypercholesterolemia, unspecified: Secondary | ICD-10-CM | POA: Diagnosis not present

## 2019-10-05 NOTE — Patient Instructions (Signed)
Stop taking Clopidogrel - Plavix  You  May hold ASA for your procedure  I will see you in one year.

## 2019-10-05 NOTE — Addendum Note (Signed)
Addended by: Kathyrn Lass on: 10/05/2019 08:38 AM   Modules accepted: Orders

## 2019-10-09 ENCOUNTER — Other Ambulatory Visit (HOSPITAL_COMMUNITY)
Admission: RE | Admit: 2019-10-09 | Discharge: 2019-10-09 | Disposition: A | Discharge status: Home / Self Care | Payer: Medicare Other | Source: Ambulatory Visit | Attending: Urology | Admitting: Urology

## 2019-10-09 DIAGNOSIS — Z20822 Contact with and (suspected) exposure to covid-19: Secondary | ICD-10-CM | POA: Insufficient documentation

## 2019-10-09 DIAGNOSIS — Z01812 Encounter for preprocedural laboratory examination: Secondary | ICD-10-CM | POA: Insufficient documentation

## 2019-10-09 LAB — SARS CORONAVIRUS 2 (TAT 6-24 HRS): SARS Coronavirus 2: NEGATIVE

## 2019-10-10 ENCOUNTER — Encounter (HOSPITAL_BASED_OUTPATIENT_CLINIC_OR_DEPARTMENT_OTHER): Payer: Self-pay | Admitting: Urology

## 2019-10-10 ENCOUNTER — Other Ambulatory Visit: Payer: Self-pay

## 2019-10-10 NOTE — Progress Notes (Addendum)
ADDENDUM:  Chart reviewed by anesthesia, Konrad Felix PA, ok to proceed.   Spoke w/ via phone for pre-op interview--- PT Lab needs dos---- Istat 8              Lab results------ current ekg in chart/ epic COVID test ------ done 10-09-2019 @1200 , result in epic Arrive at -------  0715 NPO after ------ [place] Medications to take morning of surgery ----- Metoprolol, Norvasc w/ sips of water. Diabetic medication ----- do not take metformin morning of surgery Patient Special Instructions ----- asked to bring hypoglossal nerve stimulator control with home dos Pre-Op special Istructions ----- cardiac clearance w/ lov note 10-05-2019 in epic/ chart Patient verbalized understanding of instructions that were given at this phone interview. Patient denies shortness of breath, chest pain, fever, cough a this phone interview.   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 Chart to be reviewed by Konrad Felix PA.  PCP:  [facility] [place] Cardiologist :  Dr Martinique [facility] 10-05-2019 with clearance, epic) Chest x-ray :  Chest CT 06-01-2019 epic EKG : 10-05-2019 epic Echo : 05-06-2014 epic Stress test:  Nuclear 07-02-2016 epic Cardiac Cath :  Sleep Study/ CPAP : Fasting Blood Sugar :   89-95   / Checks Blood Sugar -- times a day:   3 times weekly in am  Blood Thinner/ Instructions /Last Dose:  NO (pt given instructions by dr Martinique to discontinue plavix permanently  / last dose 10-04-2019 ASA / Instructions/ Last Dose :  AS 81MG /  Pt given instructions by dr Martinique to stop 5 days prior to surgery/  Per pt last dose 10-04-2019

## 2019-10-12 ENCOUNTER — Encounter (HOSPITAL_BASED_OUTPATIENT_CLINIC_OR_DEPARTMENT_OTHER): Payer: Self-pay | Admitting: Urology

## 2019-10-12 ENCOUNTER — Ambulatory Visit (HOSPITAL_BASED_OUTPATIENT_CLINIC_OR_DEPARTMENT_OTHER): Payer: Medicare Other | Admitting: Physician Assistant

## 2019-10-12 ENCOUNTER — Ambulatory Visit (HOSPITAL_BASED_OUTPATIENT_CLINIC_OR_DEPARTMENT_OTHER)
Admission: RE | Admit: 2019-10-12 | Discharge: 2019-10-12 | Disposition: A | Discharge status: Home / Self Care | Payer: Medicare Other | Attending: Urology | Admitting: Urology

## 2019-10-12 ENCOUNTER — Other Ambulatory Visit: Payer: Self-pay

## 2019-10-12 ENCOUNTER — Encounter (HOSPITAL_BASED_OUTPATIENT_CLINIC_OR_DEPARTMENT_OTHER)
Admission: RE | Disposition: A | Discharge status: Home / Self Care | Payer: Self-pay | Source: Home / Self Care | Attending: Urology

## 2019-10-12 DIAGNOSIS — Z888 Allergy status to other drugs, medicaments and biological substances status: Secondary | ICD-10-CM | POA: Diagnosis not present

## 2019-10-12 DIAGNOSIS — E119 Type 2 diabetes mellitus without complications: Secondary | ICD-10-CM | POA: Diagnosis not present

## 2019-10-12 DIAGNOSIS — Z79899 Other long term (current) drug therapy: Secondary | ICD-10-CM | POA: Insufficient documentation

## 2019-10-12 DIAGNOSIS — Z955 Presence of coronary angioplasty implant and graft: Secondary | ICD-10-CM | POA: Insufficient documentation

## 2019-10-12 DIAGNOSIS — N132 Hydronephrosis with renal and ureteral calculous obstruction: Secondary | ICD-10-CM | POA: Insufficient documentation

## 2019-10-12 DIAGNOSIS — E785 Hyperlipidemia, unspecified: Secondary | ICD-10-CM | POA: Diagnosis not present

## 2019-10-12 DIAGNOSIS — I2510 Atherosclerotic heart disease of native coronary artery without angina pectoris: Secondary | ICD-10-CM | POA: Insufficient documentation

## 2019-10-12 DIAGNOSIS — M1990 Unspecified osteoarthritis, unspecified site: Secondary | ICD-10-CM | POA: Insufficient documentation

## 2019-10-12 DIAGNOSIS — Z7982 Long term (current) use of aspirin: Secondary | ICD-10-CM | POA: Insufficient documentation

## 2019-10-12 DIAGNOSIS — G4733 Obstructive sleep apnea (adult) (pediatric): Secondary | ICD-10-CM | POA: Diagnosis not present

## 2019-10-12 DIAGNOSIS — Z7984 Long term (current) use of oral hypoglycemic drugs: Secondary | ICD-10-CM | POA: Insufficient documentation

## 2019-10-12 DIAGNOSIS — I110 Hypertensive heart disease with heart failure: Secondary | ICD-10-CM | POA: Insufficient documentation

## 2019-10-12 DIAGNOSIS — I5032 Chronic diastolic (congestive) heart failure: Secondary | ICD-10-CM | POA: Insufficient documentation

## 2019-10-12 DIAGNOSIS — Z8249 Family history of ischemic heart disease and other diseases of the circulatory system: Secondary | ICD-10-CM | POA: Insufficient documentation

## 2019-10-12 DIAGNOSIS — Z87442 Personal history of urinary calculi: Secondary | ICD-10-CM | POA: Diagnosis not present

## 2019-10-12 HISTORY — PX: CYSTOSCOPY WITH RETROGRADE PYELOGRAM, URETEROSCOPY AND STENT PLACEMENT: SHX5789

## 2019-10-12 HISTORY — DX: Unspecified osteoarthritis, unspecified site: M19.90

## 2019-10-12 HISTORY — DX: Atrioventricular block, first degree: I44.0

## 2019-10-12 HISTORY — DX: Post-traumatic stress disorder, unspecified: F43.10

## 2019-10-12 HISTORY — DX: Presence of external hearing-aid: Z97.4

## 2019-10-12 HISTORY — DX: Frequency of micturition: R35.0

## 2019-10-12 HISTORY — DX: Type 2 diabetes mellitus without complications: E11.9

## 2019-10-12 HISTORY — DX: Chronic diastolic (congestive) heart failure: I50.32

## 2019-10-12 HISTORY — DX: Calculus of ureter: N20.1

## 2019-10-12 HISTORY — DX: Nocturia: R35.1

## 2019-10-12 HISTORY — DX: Presence of spectacles and contact lenses: Z97.3

## 2019-10-12 HISTORY — PX: HOLMIUM LASER APPLICATION: SHX5852

## 2019-10-12 LAB — POCT I-STAT, CHEM 8
BUN: 26 mg/dL — ABNORMAL HIGH (ref 8–23)
Calcium, Ion: 1.23 mmol/L (ref 1.15–1.40)
Chloride: 102 mmol/L (ref 98–111)
Creatinine, Ser: 1.2 mg/dL (ref 0.61–1.24)
Glucose, Bld: 143 mg/dL — ABNORMAL HIGH (ref 70–99)
HCT: 42 % (ref 39.0–52.0)
Hemoglobin: 14.3 g/dL (ref 13.0–17.0)
Potassium: 3.5 mmol/L (ref 3.5–5.1)
Sodium: 140 mmol/L (ref 135–145)
TCO2: 28 mmol/L (ref 22–32)

## 2019-10-12 LAB — GLUCOSE, CAPILLARY: Glucose-Capillary: 148 mg/dL — ABNORMAL HIGH (ref 70–99)

## 2019-10-12 SURGERY — CYSTOURETEROSCOPY, WITH RETROGRADE PYELOGRAM AND STENT INSERTION
Anesthesia: General | Site: Renal | Laterality: Left

## 2019-10-12 MED ORDER — DEXAMETHASONE SODIUM PHOSPHATE 10 MG/ML IJ SOLN
INTRAMUSCULAR | Status: AC
  Filled 2019-10-12: qty 1

## 2019-10-12 MED ORDER — EPHEDRINE 5 MG/ML INJ
INTRAVENOUS | Status: AC
  Filled 2019-10-12: qty 10

## 2019-10-12 MED ORDER — PHENYLEPHRINE 40 MCG/ML (10ML) SYRINGE FOR IV PUSH (FOR BLOOD PRESSURE SUPPORT)
PREFILLED_SYRINGE | INTRAVENOUS | Status: AC
  Filled 2019-10-12: qty 10

## 2019-10-12 MED ORDER — KETOROLAC TROMETHAMINE 30 MG/ML IJ SOLN
INTRAMUSCULAR | Status: AC
  Filled 2019-10-12: qty 1

## 2019-10-12 MED ORDER — KETOROLAC TROMETHAMINE 15 MG/ML IJ SOLN
INTRAMUSCULAR | Status: DC | PRN
  Administered 2019-10-12: 15 mg via INTRAVENOUS

## 2019-10-12 MED ORDER — EPHEDRINE SULFATE 50 MG/ML IJ SOLN
INTRAMUSCULAR | Status: DC | PRN
  Administered 2019-10-12: 20 mg via INTRAVENOUS

## 2019-10-12 MED ORDER — GLYCOPYRROLATE PF 0.2 MG/ML IJ SOSY
PREFILLED_SYRINGE | INTRAMUSCULAR | Status: AC
  Filled 2019-10-12: qty 1

## 2019-10-12 MED ORDER — OXYCODONE-ACETAMINOPHEN 5-325 MG PO TABS: 1.0000 | ORAL_TABLET | Freq: Four times a day (QID) | ORAL | 0 refills | Status: AC | PRN

## 2019-10-12 MED ORDER — LIDOCAINE HCL (CARDIAC) PF 100 MG/5ML IV SOSY
PREFILLED_SYRINGE | INTRAVENOUS | Status: DC | PRN
  Administered 2019-10-12: 60 mg via INTRAVENOUS

## 2019-10-12 MED ORDER — FENTANYL CITRATE (PF) 100 MCG/2ML IJ SOLN
INTRAMUSCULAR | Status: DC | PRN
  Administered 2019-10-12: 12.5 ug via INTRAVENOUS
  Administered 2019-10-12: 25 ug via INTRAVENOUS
  Administered 2019-10-12: 12.5 ug via INTRAVENOUS
  Administered 2019-10-12: 25 ug via INTRAVENOUS
  Administered 2019-10-12 (×2): 12.5 ug via INTRAVENOUS

## 2019-10-12 MED ORDER — GENTAMICIN SULFATE 40 MG/ML IJ SOLN: 5.0000 mg/kg | INTRAVENOUS | Status: DC

## 2019-10-12 MED ORDER — SODIUM CHLORIDE 0.9 % IV SOLN
INTRAVENOUS | Status: DC | PRN
  Administered 2019-10-12 (×2): 80 ug via INTRAVENOUS

## 2019-10-12 MED ORDER — SODIUM CHLORIDE 0.9 % IR SOLN
Status: DC | PRN
  Administered 2019-10-12: 3000 mL

## 2019-10-12 MED ORDER — KETOROLAC TROMETHAMINE 10 MG PO TABS: 10.0000 mg | ORAL_TABLET | Freq: Three times a day (TID) | ORAL | 0 refills | Status: DC | PRN

## 2019-10-12 MED ORDER — ACETAMINOPHEN 500 MG PO TABS
1000.0000 mg | ORAL_TABLET | Freq: Once | ORAL | Status: DC
  Filled 2019-10-12: qty 2

## 2019-10-12 MED ORDER — LACTATED RINGERS IV SOLN
INTRAVENOUS | Status: DC
  Administered 2019-10-12: 50 mL/h via INTRAVENOUS
  Filled 2019-10-12 (×2): qty 1000

## 2019-10-12 MED ORDER — FENTANYL CITRATE (PF) 100 MCG/2ML IJ SOLN
INTRAMUSCULAR | Status: AC
  Filled 2019-10-12: qty 2

## 2019-10-12 MED ORDER — GLYCOPYRROLATE 0.2 MG/ML IJ SOLN
INTRAMUSCULAR | Status: DC | PRN
  Administered 2019-10-12: .2 mg via INTRAVENOUS

## 2019-10-12 MED ORDER — PROPOFOL 10 MG/ML IV BOLUS
INTRAVENOUS | Status: DC | PRN
  Administered 2019-10-12: 140 mg via INTRAVENOUS

## 2019-10-12 MED ORDER — IOHEXOL 300 MG/ML  SOLN
INTRAMUSCULAR | Status: DC | PRN
  Administered 2019-10-12: 09:00:00 20 mL

## 2019-10-12 MED ORDER — ONDANSETRON HCL 4 MG/2ML IJ SOLN
INTRAMUSCULAR | Status: AC
  Filled 2019-10-12: qty 2

## 2019-10-12 MED ORDER — ONDANSETRON HCL 4 MG/2ML IJ SOLN
INTRAMUSCULAR | Status: DC | PRN
  Administered 2019-10-12: 4 mg via INTRAVENOUS

## 2019-10-12 MED ORDER — DEXAMETHASONE SODIUM PHOSPHATE 4 MG/ML IJ SOLN
INTRAMUSCULAR | Status: DC | PRN
  Administered 2019-10-12: 5 mg via INTRAVENOUS

## 2019-10-12 MED ORDER — ACETAMINOPHEN 500 MG PO TABS
ORAL_TABLET | ORAL | Status: AC
  Filled 2019-10-12: qty 2

## 2019-10-12 MED ORDER — FENTANYL CITRATE (PF) 100 MCG/2ML IJ SOLN
25.0000 ug | INTRAMUSCULAR | Status: DC | PRN
  Filled 2019-10-12: qty 1

## 2019-10-12 MED ORDER — SENNOSIDES-DOCUSATE SODIUM 8.6-50 MG PO TABS: 1.0000 | ORAL_TABLET | Freq: Two times a day (BID) | ORAL | 0 refills | Status: DC

## 2019-10-12 MED ORDER — LIDOCAINE 2% (20 MG/ML) 5 ML SYRINGE
INTRAMUSCULAR | Status: AC
  Filled 2019-10-12: qty 5

## 2019-10-12 MED ORDER — GENTAMICIN SULFATE 40 MG/ML IJ SOLN
5.0000 mg/kg | Freq: Once | INTRAVENOUS | Status: AC
  Administered 2019-10-12: 372.5 mg via INTRAVENOUS
  Filled 2019-10-12: qty 9.25

## 2019-10-12 MED ORDER — CEPHALEXIN 500 MG PO CAPS: 500.0000 mg | ORAL_CAPSULE | Freq: Two times a day (BID) | ORAL | 0 refills | Status: DC

## 2019-10-12 MED ORDER — PROPOFOL 10 MG/ML IV BOLUS
INTRAVENOUS | Status: AC
  Filled 2019-10-12: qty 20

## 2019-10-12 SURGICAL SUPPLY — 27 items
BAG DRAIN URO-CYSTO SKYTR STRL (DRAIN) ×3 IMPLANT
BAG DRN UROCATH (DRAIN) ×1
BASKET LASER NITINOL 1.9FR (BASKET) IMPLANT
BSKT STON RTRVL 120 1.9FR (BASKET)
CATH INTERMIT  6FR 70CM (CATHETERS) IMPLANT
CLOTH BEACON ORANGE TIMEOUT ST (SAFETY) ×3 IMPLANT
FIBER LASER FLEXIVA 365 (UROLOGICAL SUPPLIES) IMPLANT
FIBER LASER TRAC TIP (UROLOGICAL SUPPLIES) ×2 IMPLANT
GLOVE BIO SURGEON STRL SZ7.5 (GLOVE) ×3 IMPLANT
GLOVE BIOGEL PI IND STRL 7.5 (GLOVE) IMPLANT
GLOVE BIOGEL PI INDICATOR 7.5 (GLOVE) ×4
GOWN STRL REUS W/TWL LRG LVL3 (GOWN DISPOSABLE) ×3 IMPLANT
GUIDEWIRE ANG ZIPWIRE 038X150 (WIRE) ×3 IMPLANT
GUIDEWIRE STR DUAL SENSOR (WIRE) ×3 IMPLANT
IV NS 1000ML (IV SOLUTION)
IV NS 1000ML BAXH (IV SOLUTION) IMPLANT
IV NS IRRIG 3000ML ARTHROMATIC (IV SOLUTION) ×3 IMPLANT
KIT TURNOVER CYSTO (KITS) ×3 IMPLANT
MANIFOLD NEPTUNE II (INSTRUMENTS) ×3 IMPLANT
NS IRRIG 500ML POUR BTL (IV SOLUTION) ×3 IMPLANT
PACK CYSTO (CUSTOM PROCEDURE TRAY) ×3 IMPLANT
STENT POLARIS 5FRX26 (STENTS) ×2 IMPLANT
SYR 10ML LL (SYRINGE) ×3 IMPLANT
TUBE CONNECTING 12'X1/4 (SUCTIONS) ×1
TUBE CONNECTING 12X1/4 (SUCTIONS) ×2 IMPLANT
TUBE FEEDING 8FR 16IN STR KANG (MISCELLANEOUS) ×2 IMPLANT
TUBING UROLOGY SET (TUBING) ×3 IMPLANT

## 2019-10-12 NOTE — Transfer of Care (Signed)
Immediate Anesthesia Transfer of Care Note  Patient: Jeffery Schmidt  Procedure(s) Performed: Procedure(s) (LRB): CYSTOSCOPY WITH RETROGRADE PYELOGRAM, URETEROSCOPY AND STENT PLACEMENT (Left) HOLMIUM LASER APPLICATION (Left)  Patient Location: PACU  Anesthesia Type: General  Level of Consciousness: awake, sedated, patient cooperative and responds to stimulation  Airway & Oxygen Therapy: Patient Spontanous Breathing and Patient connected to [place] 02 and soft FM   Post-op Assessment: Report given to PACU RN, Post -op Vital signs reviewed and stable and Patient moving all extremities  Post vital signs: Reviewed and stable  Complications: No apparent anesthesia complications

## 2019-10-12 NOTE — Anesthesia Preprocedure Evaluation (Addendum)
Anesthesia Evaluation  Patient identified by MRN, date of birth, ID band Patient awake    Reviewed: Allergy & Precautions, NPO status , Patient's Chart, lab work & pertinent test results, reviewed documented beta blocker date and time   Airway Mallampati: III  TM Distance: >3 FB Neck ROM: Full    Dental no notable dental hx. (+) Teeth Intact, Dental Advisory Given   Pulmonary sleep apnea (Aspire device for OSA, deactivated) ,    Pulmonary exam normal breath sounds clear to auscultation       Cardiovascular hypertension, Pt. on medications and Pt. on home beta blockers + angina + CAD, + Cardiac Stents (DES to LCx and RCA 2015) and +CHF  Normal cardiovascular exam Rhythm:Regular Rate:Normal  EKG 1st degree AV block  TTE 2015 EF 60-65%, G1DD, mild MR  Stress Test 2018 The left ventricular ejection fraction is normal (55-65%). Nuclear stress EF: 60%. There was no ST segment deviation noted during stress. The study is normal. This is a low risk study   Neuro/Psych PSYCHIATRIC DISORDERS Anxiety negative neurological ROS     GI/Hepatic negative GI ROS, Neg liver ROS,   Endo/Other  negative endocrine ROSdiabetes, Oral Hypoglycemic Agents  Renal/GU negative Renal ROS  negative genitourinary   Musculoskeletal  (+) Arthritis ,   Abdominal   Peds  Hematology  (+) Blood dyscrasia (on plavix, last dose 1 week ago), ,   Anesthesia Other Findings   Reproductive/Obstetrics                           Anesthesia Physical Anesthesia Plan  ASA: III  Anesthesia Plan: General   Post-op Pain Management:    Induction: Intravenous  PONV Risk Score and Plan: 2 and Ondansetron, Dexamethasone and Treatment may vary due to age or medical condition  Airway Management Planned: LMA  Additional Equipment:   Intra-op Plan:   Post-operative Plan: Extubation in OR  Informed Consent: I have reviewed the  patients History and Physical, chart, labs and discussed the procedure including the risks, benefits and alternatives for the proposed anesthesia with the patient or authorized representative who has indicated his/her understanding and acceptance.     Dental advisory given  Plan Discussed with: CRNA  Anesthesia Plan Comments:         Anesthesia Quick Evaluation

## 2019-10-12 NOTE — Anesthesia Postprocedure Evaluation (Signed)
Anesthesia Post Note  Patient: Jeffery Schmidt  Procedure(s) Performed: CYSTOSCOPY WITH RETROGRADE PYELOGRAM, URETEROSCOPY AND STENT PLACEMENT (Left Renal) HOLMIUM LASER APPLICATION (Left Renal)     Patient location during evaluation: PACU Anesthesia Type: General Level of consciousness: awake and alert Pain management: pain level controlled Vital Signs Assessment: post-procedure vital signs reviewed and stable Respiratory status: spontaneous breathing, nonlabored ventilation, respiratory function stable and patient connected to nasal cannula oxygen Cardiovascular status: blood pressure returned to baseline and stable Postop Assessment: no apparent nausea or vomiting Anesthetic complications: no    Last Vitals:  Vitals:   10/12/19 1045 10/12/19 1135  BP:  134/66  Pulse: (!) 58 (!) 52  Resp: 18 14  Temp:  (!) 36.3 C  SpO2: 94% 94%    Last Pain:  Vitals:   10/12/19 1135  TempSrc: Oral  PainSc: 0-No pain                 Jalesa Thien L Kenneshia Rehm

## 2019-10-12 NOTE — Discharge Instructions (Signed)
1 - You may have urinary urgency (bladder spasms) and bloody urine on / off with stent in place. This is normal.  2 - Remove tethered stent on Monday morning at home by pulling on string, then blue-white plastic tubing, and discarding. Office is open Monday if any problems arise.   3 - Call MD or go to ER for fever >102, severe pain / nausea / vomiting not relieved by medications, or acute change in medical status    [facility] [phone] Post Ureteroscopy With or Without Stent Instructions  Definitions:  Ureter: The duct that transports urine from the kidney to the bladder. Stent:   A plastic hollow tube that is placed into the ureter, from the kidney to the bladder to prevent the ureter from swelling shut.  GENERAL INSTRUCTIONS:  Despite the fact that no skin incisions were used, the area around the ureter and bladder is raw and irritated. The stent is a foreign body which will further irritate the bladder wall. This irritation is manifested by increased frequency of urination, both day and night, and by an increase in the urge to urinate. In some, the urge to urinate is present almost always. Sometimes the urge is strong enough that you may not be able to stop yourself from urinating. The only real cure is to remove the stent and then give time for the bladder wall to heal which can't be done until the danger of the ureter swelling shut has passed, which varies.  You may see some blood in your urine while the stent is in place and a few days afterwards. Do not be alarmed, even if the urine was clear for a while. Get off your feet and drink lots of fluids until clearing occurs. If you start to pass clots or don't improve, call us.  DIET: You may return to your normal diet immediately. Because of the raw surface of your bladder, alcohol, spicy foods, acid type foods and drinks with caffeine may cause irritation or frequency and should be used in moderation. To keep your  urine flowing freely and to avoid constipation, drink plenty of fluids during the day ( 8-10 glasses ). Tip: Avoid cranberry juice because it is very acidic.  ACTIVITY: Your physical activity doesn't need to be restricted. However, if you are very active, you may see some blood in your urine. We suggest that you reduce your activity under these circumstances until the bleeding has stopped.  BOWELS: It is important to keep your bowels regular during the postoperative period. Straining with bowel movements can cause bleeding. A bowel movement every other day is reasonable. Use a mild laxative if needed, such as Milk of Magnesia 2-3 tablespoons, or 2 Dulcolax tablets. Call if you continue to have problems. If you have been taking narcotics for pain, before, during or after your surgery, you may be constipated. Take a laxative if necessary.   MEDICATION: You should resume your pre-surgery medications unless told not to. In addition you will often be given an antibiotic to prevent infection. These should be taken as prescribed until the bottles are finished unless you are having an unusual reaction to one of the drugs.  PROBLEMS YOU SHOULD REPORT TO US:  Fevers over 100.5 Fahrenheit.  Heavy bleeding, or clots ( See above notes about blood in urine ).  Inability to urinate.  Drug reactions ( hives, rash, nausea, vomiting, diarrhea ).  Severe burning or pain with urination that is not improving.  FOLLOW-UP: You will   Severe burning or pain with urination that is not improving.  FOLLOW-UP: You will need a follow-up appointment to monitor your progress. Call for this appointment at the number listed above. Usually the first appointment will be about three to fourteen days after your surgery.      Post Anesthesia Home Care Instructions  Activity: Get plenty of rest for the remainder of the day. A responsible adult should stay with you for 24 hours following the procedure.  For the next 24 hours, DO NOT: -Drive a car -Paediatric nurse -Drink alcoholic  beverages -Take any medication unless instructed by your physician -Make any legal decisions or sign important papers.  Meals: Start with liquid foods such as gelatin or soup. Progress to regular foods as tolerated. Avoid greasy, spicy, heavy foods. If nausea and/or vomiting occur, drink only clear liquids until the nausea and/or vomiting subsides. Call your physician if vomiting continues.  Special Instructions/Symptoms: Your throat may feel dry or sore from the anesthesia or the breathing tube placed in your throat during surgery. If this causes discomfort, gargle with warm salt water. The discomfort should disappear within 24 hours.  If you had a scopolamine patch placed behind your ear for the management of post- operative nausea and/or vomiting:  1. The medication in the patch is effective for 72 hours, after which it should be removed.  Wrap patch in a tissue and discard in the trash. Wash hands thoroughly with soap and water. 2. You may remove the patch earlier than 72 hours if you experience unpleasant side effects which may include dry mouth, dizziness or visual disturbances. 3. Avoid touching the patch. Wash your hands with soap and water after contact with the patch.

## 2019-10-12 NOTE — Brief Op Note (Signed)
10/12/2019  9:30 AM  PATIENT:  Jeffery Schmidt  79 y.o. male  PRE-OPERATIVE DIAGNOSIS:  LEFT URETERAL STONE  POST-OPERATIVE DIAGNOSIS:  LEFT URETERAL STONE  PROCEDURE:  Procedure(s): CYSTOSCOPY WITH RETROGRADE PYELOGRAM, URETEROSCOPY AND STENT PLACEMENT (Left) HOLMIUM LASER APPLICATION (Left)  SURGEON:  Surgeon(s) and Role:    Alexis Frock, MD - Primary  PHYSICIAN ASSISTANT:   ASSISTANTS: none   ANESTHESIA:   general  EBL:  0 mL   BLOOD ADMINISTERED:none  DRAINS: none   LOCAL MEDICATIONS USED:  NONE  SPECIMEN:  Source of Specimen:  left ureteral stone fragments  DISPOSITION OF SPECIMEN:  [facility] for compositional analysis  COUNTS:  YES  TOURNIQUET:  * No tourniquets in log *  DICTATION: .Other Dictation: Dictation Number [id number]  PLAN OF CARE: Discharge to home after PACU  PATIENT DISPOSITION:  PACU - hemodynamically stable.   Delay start of Pharmacological VTE agent (>24hrs) due to surgical blood loss or risk of bleeding: yes

## 2019-10-12 NOTE — Op Note (Signed)
NAME: Jeffery Schmidt, Jeffery Schmidt MEDICAL RECORD [id number] ACCOUNT [id number] DATE OF BIRTH:[birth year] FACILITY: [facility] LOCATION: [facility] PHYSICIAN:Peretz Thieme Tresa Moore, MD  OPERATIVE REPORT  DATE OF PROCEDURE:  10/12/2019  SURGEON:  Alexis Frock, MD  PREOPERATIVE DIAGNOSIS:  Left distal ureteral stone with hydronephrosis.  PROCEDURE: 1.  Cystoscopy, left retrograde pyelogram with interpretation. 2.  Left ureteroscopy with laser lithotripsy. 3.  Insertion of left ureteral stent, 5 x 26 Polaris with tether.  ESTIMATED BLOOD LOSS:  Nil.  MEDICATIONS:  None.  SPECIMENS:  Left distal ureteral stone fragments for composition analysis.  FINDINGS:   1.  Mild left hydronephrosis.   2.  Distal left ureteral stone. 3.  Complete resolution of all stone fragments larger than one-third mm following laser lithotripsy and basket extraction. 4.  Successful placement of left ureteral stent proximal end in renal pelvis, distal end in urinary bladder.  INDICATIONS:  The patient is a very pleasant 79 year old man who was found incidentally to have a left distal ureteral stone with hydronephrosis.  Options were discussed for management including medical therapy versus shockwave lithotripsy versus  ureteroscopy, with the latter being favored given the stone size and location and he wished to proceed.  Informed consent was obtained and placed in the medical record.  DESCRIPTION OF PROCEDURE:  The patient being identified, the procedure being left ureteroscopic stent placement was confirmed.  Procedure timeout was performed.  Intravenous antibiotics administered.  General anesthesia induced.  The patient was placed  into a low lithotomy position.  A sterile field was created, prepping and draping base of the penis, perineum and proximal thighs using iodine.  Cystourethroscopy was performed using 21-French rigid cystoscope with offset lens.  Inspection of bladder  revealed no diverticula, calcifications or  papillary lesions.  There was mild trabeculation.  The left ureteral orifice was cannulated with a 6-French renal catheter and left retrograde pyelogram was obtained.  Left retrograde pyelogram demonstrated a single left ureter and single system left kidney.  There was a mobile filling defect in the distal ureter consistent with known stone.  There was mild hydronephrosis above this.  A 0.03 ZIPwire was advanced to the  lower pole and set aside as a safety wire.  An 8-French feeding tube was placed in the urinary bladder for pressure release. Semirigid ureteroscopy performed of distal left ureter.  Alongside a separate sensor working wire, there in fact was a left  distal ureteral stone.  It was approximately 1 cm above the intramural ureter.  There was mild hydronephrosis in this area.  It was too large for simple basketing.  Holmium laser energy was then applied to the stone using setting 0.2 joules and 20 Hz.   The stone was broken into approximately 3 smaller pieces that were then sequentially grasped with the long axis, removed in their entirety, set aside for composition analysis.  Following this, there was complete resolution of all accessible stone  fragments larger than one-third mm in the ureter.  There was some mild mucosal edema at the site of prior stone impaction.  Given this, it was felt that brief interval stenting with a tethered stent would be warranted.  As such, a new 5 x 26 Polaris-type  stent was placed with remaining safety wire using fluoroscopic guidance.  Good proximal and distal planes were noted.  Tether was left in place and fashioned to the dorsum of the penis.  The procedure was terminated.  The patient tolerated the procedure  well.  No immediate perioperative complications.  The patient  was taken to postanesthesia care unit in stable condition.  Plan for discharge home.  VN/NUANCE  D:10/12/2019 T:10/12/2019 JOB:[id number]

## 2019-10-12 NOTE — Anesthesia Procedure Notes (Signed)
Procedure Name: LMA Insertion Date/Time: 10/12/2019 9:07 AM Performed by: Justice Rocher, CRNA Pre-anesthesia Checklist: Patient identified, Emergency Drugs available, Suction available, Patient being monitored and Timeout performed Patient Re-evaluated:Patient Re-evaluated prior to induction Oxygen Delivery Method: Circle system utilized Preoxygenation: Pre-oxygenation with 100% oxygen Induction Type: IV induction Ventilation: Mask ventilation without difficulty LMA: LMA inserted LMA Size: 4.0 Number of attempts: 1 Airway Equipment and Method: Bite block Placement Confirmation: positive ETCO2,  CO2 detector and breath sounds checked- equal and bilateral Tube secured with: Tape Dental Injury: Teeth and Oropharynx as per pre-operative assessment

## 2019-10-12 NOTE — H&P (Signed)
Jeffery Schmidt is an 79 y.o. male.    Chief Complaint: Pre-Op LEFT Ureteroscopic Stone Manipulation  HPI:   1 - Minimally Complex Bilateral Renal Cysts - Rt 9cm upper pole with single thin calcified septum and left <2cm peri-pelvic and cortical cysts incidental on abd CT 08/2019. NO enhancing noduels or mass effect.   2 - Incidetnal Left Ureteral Stone - 27mm left distal stone with mild hydro incidetnal on abd CT 08/2019. Stone is solitary. Cr <1.5. One stone treated in 1980s with SWL that required nephrostomy.   PMH sig for CAD/Stents (no limitations follows. P. Martinique MD cards), HTN. Retired Music therapist. His PCP is Cristie Hem MD with [facility].   Today " Jeffery Schmidt " is seen to proceed with LEFT ureteroscopy for incidetnal obstructing stone. No interval fevers. UA without infectiuos parameters. C19 screen negative. Has cards clearance.    Past Medical History:  Diagnosis Date  . CAD (coronary artery disease) cardiologist---dr Martinique   a. 1998 s/p BMS to LCX;  b. 12/2012 MV: EF 68%, no ischemia;  c. 04/2014 Cath/PCI: LM nl, LAD 90p (3.0x18 Biotronik Orsiro DES - 3.5), LCX 57m, patent stent, RCA  90 (2.5x18 Biotronik Orsiro DES - 2.75);   nuclear stress test 07-02-2016 -- low risk with no evidence reversible ischemia, nuclear ef 60%  . Complication of anesthesia    anesthesia delirium after heart cath (hx ptsd)  . Diastolic CHF, chronic ([place])    followed by dr Martinique--- last echo 05-06-2014 in epic, ef 60-65%, G1DD, mild MR, mild LAE  . First degree heart block   . Frequency of urination   . History of kidney stones   . HLD (hyperlipidemia)   . HTN (hypertension)   . Left ureteral calculus   . Nocturia   . OA (osteoarthritis)   . Obstructive sleep apnea followed by dr dohmeier   study 11-18-2014 in epic, moderate osa on cpap;  pt intolerant to cpap, pt is s/p hypoglossal nerver stimulator implantation (Inspire) on 06-12-2018 (pt re-titated 02-27-2019)  PT HAS A CONTROL TO DEVICE   . PTSD (post-traumatic stress disorder)   . S/p bare metal coronary artery stent 1998   to LCx  . S/P drug eluting coronary stent placement 05/07/2014   to pLAD and mRCA  . Type 2 diabetes mellitus ([place])    followed by pcp--- (10-10-2019 checks blood sugar 3 times per week in am,  fasting sugar-- 89-95  . Wears glasses   . Wears hearing aid in both ears     Past Surgical History:  Procedure Laterality Date  . BLEPHAROPLASTY Bilateral 2000's   upper  . COLONOSCOPY  last one 04-21-2017 in epic  . CORONARY ANGIOPLASTY WITH STENT PLACEMENT  1998   PTCA w/ BMS to LCx  . CORONARY ANGIOPLASTY WITH STENT PLACEMENT  05/07/14   PCI w/ DES  to proximal LAD and mid RCA, patent LCX stent   . DRUG INDUCED ENDOSCOPY N/A 02/22/2018   Procedure: DRUG INDUCED ENDOSCOPY;  Surgeon: Melida Quitter, MD;  Location: [facility];  Service: ENT;  Laterality: N/A;  . [place]  . LEFT HEART CATHETERIZATION WITH CORONARY ANGIOGRAM N/A 05/07/2014   Procedure: LEFT HEART CATHETERIZATION WITH CORONARY ANGIOGRAM;  Surgeon: Peter M Martinique, MD;  Location: [facility] CATH LAB;  Service: Cardiovascular;  Laterality: N/A;  . PERCUTANEOUS CORONARY STENT INTERVENTION (PCI-S)  05/07/2014   Procedure: PERCUTANEOUS CORONARY STENT INTERVENTION (PCI-S);  Surgeon: Peter M Martinique, MD;  Location: [facility]  CATH LAB;  Service: Cardiovascular;;  Drug eluting stent Prox LAD (3.0/18 Orsiro) and drug eluting stent to Mid RCA (2.5/18 Orsiro)    Family History  Problem Relation Age of Onset  . Hypertension Mother   . Coronary artery disease Other   . Heart disease Other   . Hypertension Other   . Stroke Other   . Other Father        unknown   Social History:  reports that he has never smoked. He has never used smokeless tobacco. He reports that he does not drink alcohol or use drugs.  Allergies:  Allergies  Allergen Reactions  . Brilinta [Ticagrelor] Shortness Of Breath  . Epinephrine  Anaphylaxis and Other (See Comments)    Pt states he is anaphyactic    Medications Prior to Admission  Medication Sig Dispense Refill  . amLODipine (NORVASC) 10 MG tablet Take 10 mg by mouth daily.     . Ascorbic Acid (VITAMIN C PO) Take 500 mg by mouth daily.     . Cholecalciferol (VITAMIN D3) 125 MCG (5000 UT) CAPS Take 1 capsule by mouth every other day.    . Coenzyme Q10 (COQ10 PO) Take 1 capsule by mouth daily.     [name] ezetimibe (ZETIA) 10 MG tablet Take 10 mg by mouth daily.     . Glucos-Chondroit-Hyaluron-MSM (GLUCOSAMINE CHONDROITIN JOINT PO) Take 1 tablet by mouth daily.    . hydrochlorothiazide (HYDRODIURIL) 25 MG tablet Take 25 mg by mouth daily.     [name] losartan (COZAAR) 100 MG tablet Take 100 mg by mouth daily.     . metFORMIN (GLUCOPHAGE) 500 MG tablet Take 500 mg by mouth daily with breakfast.     . metoprolol succinate (TOPROL-XL) 100 MG 24 hr tablet Take 100 mg by mouth daily.     . Omega-3 Fatty Acids (FISH OIL) 1200 MG CAPS Take 1,200 mg by mouth daily.    . pravastatin (PRAVACHOL) 20 MG tablet Take 10 mg by mouth daily.     [name] aspirin 81 MG tablet Take 81 mg by mouth daily.    [name] etodolac (LODINE) 400 MG tablet Take 400 mg by mouth daily as needed for mild pain.      No results found for this or any previous visit (from the past 48 hour(s)). No results found.  Review of Systems  Constitutional: Negative for chills and fever.  All other systems reviewed and are negative.   Height 5\' 6"  (1.676 m), weight 74.5 kg. Physical Exam  Constitutional: He appears well-developed.  Eyes: Pupils are equal, round, and reactive to light.  Cardiovascular: Normal rate.  Respiratory: Effort normal.  GI: Soft.  Genitourinary:    Genitourinary Comments: No CVAT at present.    Musculoskeletal:        General: Normal range of motion.     Cervical back: Normal range of motion.  Neurological: He is alert.  Skin: Skin is warm.  Psychiatric: He has a normal mood and affect.      Assessment/Plan  Proceed as planned with LEFT ureteroscopy with goal of stone free and prevent chronic left renal damage. Risks, benefits, alternatives, expected peri-op course discussed previously and reiterated today.   Alexis Frock, MD 10/12/2019, 6:44 AM

## 2019-11-19 ENCOUNTER — Telehealth: Payer: Self-pay

## 2019-11-19 ENCOUNTER — Other Ambulatory Visit: Payer: Self-pay | Admitting: Neurology

## 2019-11-19 DIAGNOSIS — G4734 Idiopathic sleep related nonobstructive alveolar hypoventilation: Secondary | ICD-10-CM

## 2019-11-19 DIAGNOSIS — G4733 Obstructive sleep apnea (adult) (pediatric): Secondary | ICD-10-CM

## 2019-11-19 NOTE — Telephone Encounter (Signed)
Order has been placed for the patient. 

## 2019-11-19 NOTE — Telephone Encounter (Signed)
Its time for patients one year sleep study post inspire activation. Can I get a PSG order? He is scheduled for July 11th. D.J and Vania Rea will be here that night to set up our new programmer. Thanks

## 2020-01-03 ENCOUNTER — Encounter: Payer: Self-pay | Admitting: Gastroenterology

## 2020-01-20 ENCOUNTER — Ambulatory Visit (INDEPENDENT_AMBULATORY_CARE_PROVIDER_SITE_OTHER): Payer: Medicare Other | Admitting: Neurology

## 2020-01-20 DIAGNOSIS — G4733 Obstructive sleep apnea (adult) (pediatric): Secondary | ICD-10-CM | POA: Diagnosis not present

## 2020-01-20 DIAGNOSIS — G4734 Idiopathic sleep related nonobstructive alveolar hypoventilation: Secondary | ICD-10-CM

## 2020-02-11 DIAGNOSIS — G4734 Idiopathic sleep related nonobstructive alveolar hypoventilation: Secondary | ICD-10-CM | POA: Insufficient documentation

## 2020-02-11 NOTE — Procedures (Signed)
PATIENT'S NAME:  Jeffery Schmidt, Jeffery Schmidt DOB:      Apr 02, 1941      MR#:    [id number]     DATE OF RECORDING: 01/20/2020  AL REFERRING M.D.:  Levin Erp, MD Study Performed:   Inspire Device Titration HISTORY:  This 79 -year- old gentleman is here for one -year check -up of his Inspire device.  The patient endorsed the Epworth Sleepiness Scale at 4 points.   The patient's weight 165 pounds with a height of 66 (inches), resulting in a BMI of 26.6 kg/m2. The patient's neck circumference measured 17 inches.  CURRENT MEDICATIONS: Norvasc, Aspirin, Plavix, Zetia, Hydrodiuril, Cozaar, Metformin, Toprol, Pravachol,   PROCEDURE:  This is a multichannel digital polysomnogram utilizing the SomnoStar 11.2 system.  Electrodes and sensors were applied and monitored per AASM Specifications.   EEG, EOG, Chin and Limb EMG, were sampled at 200 Hz.  ECG, Snore and Nasal Pressure, Thermal Airflow, Respiratory Effort, CPAP Flow and Pressure, Oximetry was sampled at 50 Hz. Digital video and audio were recorded.       Inspire was initiated at 2.5Volt and was advanced to 2.8Volt because of hypopneas, apneas and desaturations. Cheyne stokes breathing persisted.   At 2.8 V, there was a reduction of the AHI to 5.6/h with improvement of sleep apnea over a 76- minute sleep recording.  Lights Out was at 22:41 and Lights On at 05:01. Total recording time (TRT) was 372 minutes, with a total sleep time (TST) of 293.5 minutes. The patient's sleep latency was 3.5 minutes. REM latency was 122 minutes.  The sleep efficiency was 78.9 %.    SLEEP ARCHITECTURE: WASO (Wake after sleep onset) was 78 minutes.  There were 41 minutes in Stage N1, 103 minutes Stage N2, 79 minutes Stage N3 and 72.5 minutes in Stage REM.  The percentage of Stage N1 was 13.9%, Stage N2 was 34.9%, Stage N3 was 26.7% and Stage R (REM sleep) was 24.5%.   RESPIRATORY ANALYSIS:  There was a total of 37 respiratory events: 0 obstructive apneas, 35 central apneas and 2  mixed apneas with a total of 37 apneas and an apnea index (AI) of 7.6 /hour. There were 0 hypopneas with a hypopnea index of 0/hour. The patient also had 0 respiratory event related arousals (RERAs).      The total APNEA/HYPOPNEA INDEX  (AHI) was 7.6 /hour and the total RESPIRATORY DISTURBANCE INDEX was 7.6 /hour  6 events occurred in REM sleep and 31 events in NREM. The REM AHI was 5.0 /hour versus a non-REM AHI of 8.4 /hour.  The patient spent 0 minutes of total sleep time in the supine position and 296 minutes in non-supine. The supine AHI was 0.0, versus a non-supine AHI of 7.6.  OXYGEN SATURATION & C02:  The baseline 02 saturation was 0%, with the lowest being 85%. Time spent below 89% saturation equaled 28 minutes.  The arousals were noted as: 90 were spontaneous, 1 were associated with PLMs, 2 were associated with respiratory events. The PLM Arousal index was 0.2 /hour. Audio and video analysis did not show any abnormal or unusual movements, behaviors, phonations or vocalizations.   The patient took bathroom breaks. Mild snoring was present.  EKG was in keeping with normal sinus rhythm (NSR).   DIAGNOSIS 1. Persistent mild Central Sleep Apnea, after all obstructive apnea events were controlled under inspire settings of 2.8 Volt.   2. Borderline Sleep Related Hypoxemia.   PLANS/RECOMMENDATIONS: Mr. Pizzuto tolerated the settings up to 2.7 Volts better  than the last explored setting of 2.8V. The patient has a reduced AHI under each setting.  Ultimate Goal should be a setting of 2.8 or 2.9 volt.   DISCUSSION: slow progression will help to tolerate and achieve the higher voltage settings.   A follow up appointment will be scheduled in the Sleep Clinic at [facility].   Please call [phone] with any questions.      I certify that I have reviewed the entire raw data recording prior to the issuance of this report in accordance with the Standards of Accreditation of the  American Academy of Sleep Medicine (AASM)    Larey Seat, M.D. Diplomat, Tax adviser of Psychiatry and Neurology  Diplomat, Tax adviser of Sleep Medicine Market researcher, [employer] [facility] [employer]

## 2020-02-11 NOTE — Progress Notes (Signed)
  DIAGNOSIS 1. Persistent mild Central Sleep Apnea, after all obstructive apnea events were controlled under inspire settings of 2.8 Volt.   2. Borderline Sleep Related Hypoxemia.   PLANS/RECOMMENDATIONS: Jeffery Schmidt tolerated the settings up to 2.7 Volts better than the last explored setting of 2.8V. The patient has a reduced AHI under each setting.  Ultimate Goal should be a setting of 2.8 or 2.9 volt.   DISCUSSION: slow progression will help to tolerate and achieve the higher voltage settings.

## 2020-02-12 ENCOUNTER — Encounter: Payer: Self-pay | Admitting: Neurology

## 2020-03-05 ENCOUNTER — Ambulatory Visit: Payer: Medicare Other | Admitting: Gastroenterology

## 2020-03-27 ENCOUNTER — Other Ambulatory Visit: Payer: Self-pay

## 2020-03-27 ENCOUNTER — Encounter: Payer: Self-pay | Admitting: Neurology

## 2020-03-27 ENCOUNTER — Ambulatory Visit (INDEPENDENT_AMBULATORY_CARE_PROVIDER_SITE_OTHER): Payer: Medicare Other | Admitting: Neurology

## 2020-03-27 VITALS — BP 130/74 | HR 52 | Ht 66.0 in | Wt 163.0 lb

## 2020-03-27 DIAGNOSIS — G4733 Obstructive sleep apnea (adult) (pediatric): Secondary | ICD-10-CM

## 2020-03-27 DIAGNOSIS — I2583 Coronary atherosclerosis due to lipid rich plaque: Secondary | ICD-10-CM

## 2020-03-27 DIAGNOSIS — I2510 Atherosclerotic heart disease of native coronary artery without angina pectoris: Secondary | ICD-10-CM

## 2020-03-27 NOTE — Patient Instructions (Signed)
INSPIRE now set to 2.8 V.

## 2020-03-27 NOTE — Progress Notes (Addendum)
[place]   Provider:  Larey Seat, M D  Primary Care Physician: New:   Michael Boston, MD Referring Provider: Dr. Jacalyn Lefevre, MD      04/03/20-  Interval history : Jeffery Schmidt is a 79 y.o. male patient  , whom I had met before upon referral from ENT specialist Dr Redmond Baseman, MD. patient was here overnight for the titration of Inspire- has not reached full potential. I have the pleasure of meeting Mr. Jeffery Schmidt today who has by now seen [place] for about 18 months he was titrated on 01/20/2020 in the office setting an overnight sleep lab stay to a setting up to 2.8 V.  The patient has experienced a reduced apnea index on the each setting the ultimate goal is now defined to be a setting of 2.8 or maybe 2.9 V.  He tolerated the higher settings without difficulty at night and he was told that he can at home slowly change and advance the settings.  His AHI for this inspire titration was 7.6 overall but his AHI at the 2.8 multilevel was 5/h which is a fine resolution of apnea.  He does have Cheyne-Stokes breathing of form of central apnea but is not responsive to the inspire device but his obstructive apneas have all resolved.  Mr. Delrae Alfred has been very happy with the inspire therapy, he no longer falls asleep at red lights he noted.  His Epworth sleepiness score was today endorsed at 2 out of 24 points very great reduction his fatigue severity scale used to be in the 50s and now is 9 points.  This is the lowest achievable.  So overall he has clear clinical benefit, his obstructive sleep apnea is resolved, he is planning to get the Covid booster shot and may be combining it with the flu shot .  Larey Seat, MD  2020-04-03.    PS : His daughter died of Covid 15 he learnt , he was infected, too. Tested AB  Poitive.  HPI:  Jeffery Schmidt is a 79 y.o. male patient  , whom I had met before upon referral from ENT specialist Dr Redmond Baseman, MD.  Mr. Jeffery Schmidt reported that he had  been previously multiple times assessed for sleep apnea he started using CPAP 5 years ago but could not tolerate positive airway pressure therapy.  His sleep was too interrupted he did not feel restored or refreshed and therefore discontinued the use.  He also attempted to improve with a dental device which also has failed.  In December I saw him briefly in the sleep lab outside the regular sleep clinic appointments and he had just been implanted with an inspire device.  The device was turned on in January 2020.  The patient reports tolerating the device very well his only concern was that he had already wore out the batteries of the remote control.   Sleep and medical history; see above. Dr. Peter Martinique is his cardiologist; Dr. Melida Quitter his ENT physician.   He has a longstanding history of known coronary artery disease diagnosed first in 1998 according to my records.  In 2014 he had an ejection fraction of 68 no signs of ischemia in November 2015 he had a cardiac catheterization and percutaneous stent placement.  LAD and left circumflex were stented right coronary artery, with a Biotronik posterior device.  The patient carries a diagnosis of mild mitral valve regurgitation and a mildly  dilated  left atrium.  After the heart catheterization he had a brief delirium.  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  I reviewed the patient's current medications in detail. OR note reviewed. Cardiac visit note reviewed.  Family sleep history: mother with HTN, CAD and CVA   Social history:  Very remote smoking history, never more than 5 a day. Caffeine occasionally, ETOH : none.  Sleep habits are as follows: Bedtime is between 10-11 Pm and going to sleep is not a problem. With the inspire device he has been snoring less and sleeping deeper, feels refreshed and restored,  averaging 7 hours of sleep with fewer nocturia.     Review of Systems: Out of a complete 14 system review, the patient complains of only the following symptoms, and all other reviewed systems are negative.   How likely are you to doze in the following situations: 0 = not likely, 1 = slight chance, 2 = moderate chance, 3 = high chance  Sitting and Reading? Watching Television? Sitting inactive in a public place (theater or meeting)? Lying down in the afternoon when circumstances permit? Sitting and talking to someone? Sitting quietly after lunch without alcohol? In a car, while stopped for a few minutes in traffic? As a passenger in a car for an hour without a break?  Total = 2/ 24 from last visit 6 / 24. And 12 / 24 pre INSPIRE.      Social History   Socioeconomic History  . Marital status: Married    Spouse name: [name]  . Number of children: 6  . Years of education: Not on file  . Highest education level: Not on file  Occupational History  . Occupation: retired  Tobacco Use  . Smoking status: Never Smoker  . Smokeless tobacco: Never Used  Vaping Use  . Vaping Use: Never used  Substance and Sexual Activity  . Alcohol use: No  . Drug use: Never  . Sexual activity: Not on file  Other Topics Concern  . Not on file  Social History Narrative  . Not on file   Social Determinants of Health   Financial Resource Strain:   . Difficulty of Paying Living Expenses: Not on file  Food Insecurity:   . Worried About Charity fundraiser in the Last Year: Not on file  . Ran Out of Food in the Last Year: Not on file  Transportation Needs:   . Lack of Transportation (Medical): Not on file  . Lack of Transportation (Non-Medical): Not on file  Physical Activity:   . Days of Exercise per Week: Not on file  . Minutes of Exercise per Session: Not on file  Stress:   . Feeling of Stress : Not on file  Social Connections:   . Frequency of Communication with Friends and Family: Not  on file  . Frequency of Social Gatherings with Friends and Family: Not on file  . Attends Religious Services: Not on file  . Active Member of Clubs or Organizations: Not on file  . Attends Archivist Meetings: Not on file  . Marital Status: Not on file  Intimate Partner Violence:   . Fear of Current or Ex-Partner: Not on file  . Emotionally Abused: Not on file  . Physically Abused: Not on file  . Sexually Abused: Not on file    Family History  Problem Relation Age of Onset  . Hypertension  Mother   . Coronary artery disease Other   . Heart disease Other   . Hypertension Other   . Stroke Other   . Other Father        unknown    Past Medical History:  Diagnosis Date  . CAD (coronary artery disease) cardiologist---dr Martinique   a. 1998 s/p BMS to LCX;  b. 12/2012 MV: EF 68%, no ischemia;  c. 04/2014 Cath/PCI: LM nl, LAD 90p (3.0x18 Biotronik Orsiro DES - 3.5), LCX 61m patent stent, RCA  90 (2.5x18 Biotronik Orsiro DES - 2.75);   nuclear stress test 07-02-2016 -- low risk with no evidence reversible ischemia, nuclear ef 60%  . Complication of anesthesia    anesthesia delirium after heart cath (hx ptsd)  . Diastolic CHF, chronic ([place]    followed by dr jMartinique-- last echo 05-06-2014 in epic, ef 60-65%, G1DD, mild MR, mild LAE  . First degree heart block   . Frequency of urination   . History of kidney stones   . HLD (hyperlipidemia)   . HTN (hypertension)   . Left ureteral calculus   . Nocturia   . OA (osteoarthritis)   . Obstructive sleep apnea followed by dr dohmeier   study 11-18-2014 in epic, moderate osa on cpap;  pt intolerant to cpap, pt is s/p hypoglossal nerver stimulator implantation (Inspire) on 06-12-2018 (pt re-titated 02-27-2019)  PT HAS A CONTROL TO DEVICE  . PTSD (post-traumatic stress disorder)   . S/p bare metal coronary artery stent 1998   to LCx  . S/P drug eluting coronary stent placement 05/07/2014   to pLAD and mRCA  . Type 2 diabetes mellitus  ([place]    followed by pcp--- (10-10-2019 checks blood sugar 3 times per week in am,  fasting sugar-- 89-95  . Wears glasses   . Wears hearing aid in both ears     Past Surgical History:  Procedure Laterality Date  . BLEPHAROPLASTY Bilateral 2000's   upper  . COLONOSCOPY  last one 04-21-2017 in epic  . CORONARY ANGIOPLASTY WITH STENT PLACEMENT  1998   PTCA w/ BMS to LCx  . CORONARY ANGIOPLASTY WITH STENT PLACEMENT  05/07/14   PCI w/ DES  to proximal LAD and mid RCA, patent LCX stent   . CYSTOSCOPY WITH RETROGRADE PYELOGRAM, URETEROSCOPY AND STENT PLACEMENT Left 10/12/2019   Procedure: CYSTOSCOPY WITH RETROGRADE PYELOGRAM, URETEROSCOPY AND STENT PLACEMENT;  Surgeon: MAlexis Frock MD;  [facility]  Service: Urology;  Laterality: Left;  . DRUG INDUCED ENDOSCOPY N/A 02/22/2018   Procedure: DRUG INDUCED ENDOSCOPY;  Surgeon: BMelida Quitter MD;  [place]  Service: E[place];  Laterality: N/A;  . EBelle Meade . HOLMIUM LASER APPLICATION Left 44/08/4740  Procedure: HOLMIUM LASER APPLICATION;  Surgeon: MAlexis Frock MD;  [facility]  Service: Urology;  Laterality: Left;  . LEFT HEART CATHETERIZATION WITH CORONARY ANGIOGRAM N/A 05/07/2014   Procedure: LEFT HEART CATHETERIZATION WITH CORONARY ANGIOGRAM;  Surgeon: Peter M JMartinique MD;  [facility];  Service: Cardiovascular;  Laterality: N/A;  . PERCUTANEOUS CORONARY STENT INTERVENTION (PCI-S)  05/07/2014   Procedure: PERCUTANEOUS CORONARY STENT INTERVENTION (PCI-S);  Surgeon: Peter M JMartinique MD;  [facility];  Service: Cardiovascular;;  Drug eluting stent Prox LAD (3.0/18 Orsiro) and drug eluting stent to Mid RCA (2.5/18 Orsiro)    Current Outpatient Medications  Medication Sig Dispense Refill  . amLODipine (NORVASC) 10 MG tablet Take 10  mg by mouth daily.     . Ascorbic Acid (VITAMIN C PO) Take 500 mg by mouth daily.       [name] aspirin 81 MG tablet Take 81 mg by mouth daily.    . cephALEXin (KEFLEX) 500 MG capsule Take 1 capsule (500 mg total) by mouth 2 (two) times daily. X 3 days to prevent infection with tethered stent. 6 capsule 0  . Cholecalciferol (VITAMIN D3) 125 MCG (5000 UT) CAPS Take 1 capsule by mouth every other day.    . Coenzyme Q10 (COQ10 PO) Take 1 capsule by mouth daily.     [name] ezetimibe (ZETIA) 10 MG tablet Take 10 mg by mouth daily.     . Glucos-Chondroit-Hyaluron-MSM (GLUCOSAMINE CHONDROITIN JOINT PO) Take 1 tablet by mouth daily.    . hydrochlorothiazide (HYDRODIURIL) 25 MG tablet Take 25 mg by mouth daily.     [name] ketorolac (TORADOL) 10 MG tablet Take 1 tablet (10 mg total) by mouth every 8 (eight) hours as needed for moderate pain. Or stent discomfort post-operatively 20 tablet 0  . losartan (COZAAR) 100 MG tablet Take 100 mg by mouth daily.     . metFORMIN (GLUCOPHAGE) 500 MG tablet Take 500 mg by mouth daily with breakfast.     . metoprolol succinate (TOPROL-XL) 100 MG 24 hr tablet Take 100 mg by mouth daily.     . Omega-3 Fatty Acids (FISH OIL) 1200 MG CAPS Take 1,200 mg by mouth daily.    [name] oxyCODONE-acetaminophen (PERCOCET) 5-325 MG tablet Take 1 tablet by mouth every 6 (six) hours as needed for severe pain. Post-operatively 10 tablet 0  . pravastatin (PRAVACHOL) 20 MG tablet Take 10 mg by mouth daily.     [name] senna-docusate (SENOKOT-S) 8.6-50 MG tablet Take 1 tablet by mouth 2 (two) times daily. While taking strongest pain meds to prevent costipation. 10 tablet 0   No current facility-administered medications for this visit.    Allergies as of 03/27/2020 - Review Complete 03/27/2020  Allergen Reaction Noted  . Brilinta [ticagrelor] Shortness Of Breath 06/30/2016  . Epinephrine Anaphylaxis and Other (See Comments) 10/19/2011    Vitals: BP 130/74   Pulse (!) 52   Ht 5' 6" (1.676 m)   Wt 163 lb (73.9 kg)   BMI 26.31 kg/m  Last Weight:  Wt Readings from Last 1 Encounters:  03/27/20  163 lb (73.9 kg)   IZT:IWPY mass index is 26.31 kg/m.       Last Height:   Ht Readings from Last 1 Encounters:  03/27/20 5' 6" (1.676 m)    Physical exam:  General: The patient is awake, alert and appears not in acute distress. The patient is well groomed. Head: Normocephalic, atraumatic. Neck is supple. Mallampati 3 ,  neck circumference:17"  Nasal airflow patent,  .  Cardiovascular: deferred Respiratory: no SOB Skin:  Without evidence of facial edema, or rash Trunk:The patient's posture is erect  Neurologic exam : The patient is awake and alert, oriented to place and time.   Memory subjective described as intact.Attention span & concentration ability appears normal.  Speech is fluent,  without  dysarthria, dysphonia or aphasia.  Mood and affect are appropriate.  Cranial nerves: no loss of smell or taste-  Pupils apppear equal in size, EOM full, no facial asymmetry noted, vision perimetry reportedly intact. symmetric and tongue and uvula move midline. Shoulder shrug was symmetrical.  Tongue in midline. Impaired hearing.    I discussed the limitations of evaluation and management by  telemedicine and the availability of in person appointments. The patient expressed understanding and agreed to proceed.  The patient was advised to call back or seek an in-person evaluation if the symptoms worsen or if the condition fails to improve as anticipated.   His AHI for this inspire titration was 7.6 overall but his AHI at the 2.8 multilevel was 5/h which is a fine resolution of apnea.  He does have Cheyne-Stokes breathing of form of central apnea but is not responsive to the inspire device but his obstructive apneas have all resolved.   I provided 20 minutes of a post titration -face-to-face visit . "Titration to INSPIRE",  Larey Seat, MD    Larey Seat, MD 2/77/8242, 3:53 PM  Certified in Neurology by ABPN Certified in [place] by [facility] [address], [place], [place] [address]              PATIENT'S NAME:  Kannen, Moxey DOB:      [birth year]      MR#:    [id number]     DATE OF RECORDING: 11/21/2018 REFERRING M.D.:             Melida Quitter, MD  Study Performed:   INSPIRE DEVICE TITRATION HISTORY:  WILLIES LAVIOLETTE is a 79 y.o. male patient who I had met before upon referral from ENT specialist Dr. Redmond Baseman, MD. The patient carries a diagnosis of mild mitral valve regurgitation and has a mildly dilated left atrium. After the heart catheterization he had a brief delirium.  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 Mr. Jeffery Schmidt reported that he had been previously multiple times assessed for sleep apnea (Baseline AHI was 23.4/h.) and that he started using CPAP 5 years ago but could not tolerate positive airway pressure therapy. His sleep was too interrupted, he did not feel restored or refreshed and therefore discontinued the use.  He also attempted to improve with a dental device which also has failed.  In December I saw him briefly in the sleep lab outside the regular sleep clinic appointments and he had just been implanted with an inspire device at [facility], date    The device was turned on in January 2020.  The patient reports tolerating the device very well his only concern was that he may already wore out the batteries of the remote control.   Dr. Peter Martinique is his cardiologist; Dr. Melida Quitter his ENT physician.   He has a longstanding history of known coronary artery disease diagnosed first in 1998 according to my records. By November 2015 he had a cardiac catheterization and percutaneous stent placement for LAD and left circumflex with a Biotronic device.    The patient endorsed the Epworth Sleepiness Scale at 4 points.   The patient's weight 161 pounds with a  height of 67 (inches), resulting in a BMI of 25.3 kg/m2. The patient's neck circumference measured 17 inches.  CURRENT MEDICATIONS: Norvasc, Aspirin, Plavix, Zetia, Hydrodiuril, Cozaar, Metformin, Toprol, Pravachol,   PROCEDURE:  This is a multichannel digital polysomnogram utilizing the SomnoStar 11.2 system.  Electrodes and sensors were applied and monitored per AASM Specifications.   EEG, EOG, Chin and Limb EMG, were sampled at 200 Hz.  ECG, Snore and Nasal Pressure, Thermal Airflow, Respiratory Effort, Inspire device voltage control- Oximetry was sampled at 50 Hz. Digital video and audio were recorded.     Inspire  device was initiated at 2.2 volt and voltage was advanced to 2.7 because of hypopneas, UARS and desaturations. The patient could not resume sleeping after 5AM and therefor did not reach the 2.7 V setting while asleep.   At an [place] setting of 2.6 V, there was a reduction of the AHI to 5.1 with improvement of sleep apnea. Lights Out was at 21:16 and Lights On at 05:01. Total recording time (TRT) was 465 minutes, with a total sleep time (TST) of 340.5 minutes. The patient's sleep latency was 28 minutes. REM latency was 80 minutes.  The sleep efficiency was 73.2 %.    SLEEP ARCHITECTURE: WASO (Wake after sleep onset) was prolonged at 74 minutes.  There were 57 minutes in Stage N1, 65 minutes Stage N2, 141.5 minutes Stage N3 and 77 minutes in Stage REM.  The percentage of Stage N1 was 16.7%, Stage N2 was 19.1%, Stage N3 was 41.6% and Stage R (REM sleep) was 22.6%.   RESPIRATORY ANALYSIS:  There was a total of 24 respiratory events: 0 obstructive apneas, 1 central apnea and 0 mixed apneas with a total of 1 apnea and 23 hypopneas. The patient also had 3 respiratory event related arousals (RERAs).     The total APNEA/HYPOPNEA INDEX (AHI) was 4.2 /hour and the total RESPIRATORY DISTURBANCE INDEX was 4.8 /hour. 22 events occurred in REM sleep and 2 events in NREM. The REM AHI was 17.1 /hour  versus a non-REM AHI of 0.5 /hour.  The patient spent 8.5 minutes of total sleep time in the supine position and 332 minutes in non-supine. The supine AHI was 0.0/h, versus a non-supine AHI of 4.4/h.  OXYGEN SATURATION & C02:  The baseline 02 saturation was 92%, with the lowest being 84%. Time spent below 89% saturation equaled 122 minutes. The arousals were noted as: 35 were spontaneous, 0 were associated with PLMs, and 8 were associated with respiratory events. The patient had a total of 0 Periodic Limb Movements. The Periodic Limb Movement (PLM) index was 0 and the PLM Arousal index was 0 /hour.  Audio and video analysis did not show any abnormal or unusual movements, behaviors, phonations or vocalizations.  Only mild Snoring was noted. EKG was in keeping with normal sinus rhythm (NSR). Post-study, the patient indicated that sleep was the same as usual.   DIAGNOSIS 1. Obstructive Sleep Apnea improved under Inspire use (AHI to 4.2/h), but REM apneas were still noted, AHI was 17.1/h in REM. 2. 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   3. 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    PLANS/RECOMMENDATIONS: 1. Follow-up with Dr. Brett Fairy and INSPIRE technologist in 8-21 days. We will adjust the device to a setting above the current one ( 2.4 V)  2. Educate patient in sleep hygiene measures.   3. Maintain lean body weight. 4. The patient should avoid evening sedatives, hypnotics, and alcohol beverage consumption  A follow up appointment will be scheduled in the Sleep Clinic at [facility].   Please call [phone] with any questions. I certify that I have reviewed the entire raw data recording prior to the issuance of this report in accordance with the Standards of Accreditation of the American Academy of Sleep Medicine (AASM)  Larey Seat, M.D. 11-22-2018 Diplomat, American Board of Psychiatry and Neurology  Diplomat,  [place] of Sleep Medicine Medical Director, [place] [facility] at [facility]

## 2020-09-24 ENCOUNTER — Ambulatory Visit: Payer: Medicare Other | Admitting: Neurology

## 2021-03-23 ENCOUNTER — Ambulatory Visit: Payer: Medicare Other | Admitting: Cardiology

## 2021-07-14 ENCOUNTER — Telehealth: Payer: Self-pay | Admitting: Cardiology

## 2021-07-14 NOTE — Telephone Encounter (Signed)
Patient calling in to get Dr, Martinique recommendation one a pulmonary dr and a good physician. Please advise

## 2021-07-14 NOTE — Telephone Encounter (Signed)
Lots of good options in pulmonary- Dr Valeta Harms, Dr Lamonte Sakai, Dr Wonda Amis and others. Is he asking for PCP?   PJ

## 2021-07-15 NOTE — Telephone Encounter (Signed)
Spoke to patient Dr.Jordan's advice given.He will call [place] for a PCP.

## 2021-08-02 NOTE — Progress Notes (Signed)
Cardiology Office Note:    Date:  08/05/2021   ID:  Jeffery Schmidt, DOB [birth year], MRN [id number]  PCP:  Jeffery Boston, MD  Cardiologist:  Jeffery Witz [place], MD   Referring MD: Jeffery Boston, MD   Chief Complaint  Patient presents with   Coronary Artery Disease    History of Present Illness:    Jeffery Schmidt is a 81 y.o. male with a hx of CAD, hypertension, OSA, and hyperlipidemia.  He had a bare-metal stent placed to the left circumflex in 1998 with subsequent stenting of the LAD and RCA in November 2015.  He has a history of diabetes type 2 and chronic diastolic dysfunction.  He was  seen  on 06/30/2016 after some brief episodes of chest discomfort.  Lexiscan Myoview was ordered and showed no signs of reversible ischemia and normal EF.    He was maintained on aspirin, beta-blocker,  ARB, statin, and Zetia.  He is s/p hypoglossal nerve stimulator (Inspire) placement for OSA.   He did get Covid 19 last month. His illness was fairly mild but his wife got quite ill with PNA.   He denies any chest pain, SOB, palpitations. 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 Has labs are followed at the [place]. Went on a [place] for awhile and A1c improved but potassium went up. In general he feels very.     Past Medical History:  Diagnosis Date   CAD (coronary artery disease) cardiologist---dr Schmidt   a. 1998 s/p BMS to LCX;  b. 12/2012 MV: EF 68%, no ischemia;  c. 04/2014 Cath/PCI: LM nl, LAD 90p (3.0x18 Biotronik Orsiro DES - 3.5), LCX 60m, patent stent, RCA  90 (2.5x18 Biotronik Orsiro DES - 2.75);   nuclear stress test 07-02-2016 -- low risk with no evidence reversible ischemia, nuclear ef 25%   Complication of anesthesia    anesthesia delirium after heart cath (hx ptsd)   Diastolic CHF, chronic ([place])    followed by dr Schmidt--- last echo 05-06-2014 in epic, ef 60-65%, G1DD, mild MR, mild LAE   First degree heart block    Frequency of urination    History of kidney stones    HLD (hyperlipidemia)    HTN  (hypertension)    Left ureteral calculus    Nocturia    OA (osteoarthritis)    Obstructive sleep apnea followed by dr dohmeier   study 11-18-2014 in epic, moderate osa on cpap;  pt intolerant to cpap, pt is s/p hypoglossal nerver stimulator implantation (Inspire) on 06-12-2018 (pt re-titated 02-27-2019)  PT HAS A CONTROL TO DEVICE   PTSD (post-traumatic stress disorder)    S/p bare metal coronary artery stent 1998   to LCx   S/P drug eluting coronary stent placement 05/07/2014   to pLAD and mRCA   Type 2 diabetes mellitus ([place])    followed by pcp--- (10-10-2019 checks blood sugar 3 times per week in am,  fasting sugar-- 89-95   Wears glasses    Wears hearing aid in both ears     Past Surgical History:  Procedure Laterality Date   BLEPHAROPLASTY Bilateral 2000's   upper   COLONOSCOPY  last one 04-21-2017 in [place]   PTCA w/ BMS to LCx   [place]  05/07/14   PCI w/ DES  to proximal LAD and mid RCA, patent LCX stent    CYSTOSCOPY WITH RETROGRADE PYELOGRAM, URETEROSCOPY AND STENT PLACEMENT Left 10/12/2019  Procedure: CYSTOSCOPY WITH RETROGRADE PYELOGRAM, URETEROSCOPY AND STENT PLACEMENT;  Surgeon: Jeffery Frock, MD;  Location: [facility];  Service: Urology;  Laterality: Left;   DRUG INDUCED ENDOSCOPY N/A 02/22/2018   Procedure: DRUG INDUCED ENDOSCOPY;  Surgeon: Melida Quitter, MD;  Location: [place];  Service: ENT;  Laterality: N/A;   EXTRACORPOREAL SHOCK WAVE LITHOTRIPSY  1990s   HOLMIUM LASER APPLICATION Left 3/53/2992   Procedure: HOLMIUM LASER APPLICATION;  Surgeon: Jeffery Frock, MD;  Location: [facility];  Service: Urology;  Laterality: Left;   LEFT HEART CATHETERIZATION WITH CORONARY ANGIOGRAM N/A 05/07/2014   Procedure: LEFT HEART CATHETERIZATION WITH CORONARY ANGIOGRAM;  Surgeon: Jeffery Schmidt M Martin[place], MD;  Location: [facility] CATH LAB;  Service:  Cardiovascular;  Laterality: N/A;   PERCUTANEOUS CORONARY STENT INTERVENTION (PCI-S)  05/07/2014   Procedure: PERCUTANEOUS CORONARY STENT INTERVENTION (PCI-S);  Surgeon: Jeffery Schmidt M [place], MD;  Location: [facility] CATH LAB;  Service: Cardiovascular;;  Drug eluting stent Prox LAD (3.0/18 Orsiro) and drug eluting stent to Mid RCA (2.5/18 Orsiro)    Current Medications: Current Meds  Medication Sig   amLODipine (NORVASC) 10 MG tablet Take 10 mg by mouth daily.    Ascorbic Acid (VITAMIN C PO) Take 500 mg by mouth daily.    aspirin 81 MG tablet Take 81 mg by mouth daily.   Cholecalciferol (VITAMIN D3) 125 MCG (5000 UT) CAPS Take 1 capsule by mouth every other day.   Coenzyme Q10 (COQ10 PO) Take 1 capsule by mouth daily.    ezetimibe (ZETIA) 10 MG tablet Take 10 mg by mouth daily.    Glucos-Chondroit-Hyaluron-MSM (GLUCOSAMINE CHONDROITIN JOINT PO) Take 1 tablet by mouth daily.   hydrochlorothiazide (HYDRODIURIL) 25 MG tablet Take 25 mg by mouth daily.    ketorolac (TORADOL) 10 MG tablet Take 1 tablet (10 mg total) by mouth every 8 (eight) hours as needed for moderate pain. Or stent discomfort post-operatively   losartan (COZAAR) 100 MG tablet Take 100 mg by mouth daily.    metFORMIN (GLUCOPHAGE) 500 MG tablet Take 500 mg by mouth daily with breakfast.    metoprolol succinate (TOPROL-XL) 100 MG 24 hr tablet Take 100 mg by mouth daily.    Omega-3 Fatty Acids (FISH OIL) 1200 MG CAPS Take 1,200 mg by mouth daily.   rosuvastatin (CRESTOR) 10 MG tablet TAKE ONE-HALF TABLET BY MOUTH ONCE A DAY FOR CHOLESTEROL   senna-docusate (SENOKOT-S) 8.6-50 MG tablet Take 1 tablet by mouth 2 (two) times daily. While taking strongest pain meds to prevent costipation.     Allergies:   Brilinta [ticagrelor] and Epinephrine   Social History   Socioeconomic History   Marital status: Married    Spouse name: Jeffery Schmidt   Number of children: 6   Years of education: Not on file   Highest education level: Not on file  Occupational  History   Occupation: retired  Tobacco Use   Smoking status: Never   Smokeless tobacco: Never  Vaping Use   Vaping Use: Never used  Substance and Sexual Activity   Alcohol use: No   Drug use: Never   Sexual activity: Not on file  Other Topics Concern   Not on file  Social History Narrative   Not on file   Social Determinants of Health   Financial Resource Strain: Not on file  Food Insecurity: Not on file  Transportation Needs: Not on file  Physical Activity: Not on file  Stress: Not on file  Social Connections: Not on file  Family History: The patient's family history includes Coronary artery disease in an other family member; Heart disease in an other family member; Hypertension in his mother and another family member; Other in his father; Stroke in an other family member.  ROS:   Please see the history of present illness.     All other systems reviewed and are negative.  EKGs/Labs/Other Studies Reviewed:    The following studies were reviewed today:  Myoview 07/02/16: The left ventricular ejection fraction is normal (55-65%). Nuclear stress EF: 60%. There was no ST segment deviation noted during stress. The study is normal. This is a low risk study.   Normal resting and stress perfusion. No ischemia or infarction EF 60%   EKG:  EKG is  ordered today.  Sinus brady rate 53. First degree AV block. Otherwise normal.  I have personally reviewed and interpreted this study.   Recent Labs: No results found for requested labs within last 8760 hours.  Recent Lipid Panel    Component Value Date/Time   CHOL 173 05/09/2014 0244   TRIG 222 (H) 05/09/2014 0244   HDL 36 (L) 05/09/2014 0244   CHOLHDL 4.8 05/09/2014 0244   VLDL 44 (H) 05/09/2014 0244   LDLCALC 93 05/09/2014 0244  dated 03/31/18: cholesterol 175, triglycerides 167, HDL 53, LDL 95. A1c 6.1%. Hgb and chemistries normal.  Dated 04/03/19: CBC normal. Dated 07/17/19: cholesterol 147, triglycerides 167, HDL 42,  LDL 72. A1c 6%. Creatinine 1.28. otherwise CMET normal.  Dated 07/11/20: cholesterol 121, triglycerides 127, HDL 45, LDL 51. Normal TSH Dated 01/28/21: CMET normal. A1c 6.9%.   Physical Exam:    VS:  BP 132/62    Pulse (!) 53    Ht 5\' 6"  (1.676 m)    Wt 165 lb (74.8 kg)    SpO2 95%    BMI 26.63 kg/m     Wt Readings from Last 3 Encounters:  08/05/21 165 lb (74.8 kg)  03/27/20 163 lb (73.9 kg)  10/12/19 170 lb 6.4 oz (77.3 kg)    GENERAL:  Well appearing WM in NAD HEENT:  PERRL, EOMI, sclera are clear. Oropharynx is clear. NECK:  No jugular venous distention, carotid upstroke brisk and symmetric, no bruits, no thyromegaly or adenopathy LUNGS:  Clear to auscultation bilaterally CHEST:  Unremarkable HEART:  RRR,  PMI not displaced or sustained,S1 and S2 within normal limits, no S3, no S4: no clicks, no rubs, no murmurs ABD:  Soft, nontender. BS +, no masses or bruits. No hepatomegaly, no splenomegaly EXT:  2 + pulses throughout, no edema, no cyanosis no clubbing SKIN:  Warm and dry.  No rashes NEURO:  Alert and oriented x 3. Cranial nerves II through XII intact. PSYCH:  Cognitively intact    ASSESSMENT:    1. Coronary artery disease due to lipid rich plaque   2. Essential hypertension   3. Hypercholesterolemia   4. Type 2 diabetes mellitus with other specified complication, without long-term current use of insulin (HCC)     PLAN:    In order of problems listed above:  1. Coronary artery disease. S/p multiple stents in the past. Last in 2015.  Myoview in January 2018 was negative for reversible ischemia with normal LVEF.  He is asymptomatic. Continue ASA  2. Essential hypertension BP is well controlled per patient report, no medication changes.    3. Obstructive sleep apnea Unable to tolerate CPAP or dental appliance.  s/p placement of Inspire device implanted per ENT.  4. Type 2 diabetes  mellitus with other specified complication, without long-term current use of insulin  ([place]) Per primary. On metformin  5. Hypercholesterolemia. Intolerant of high dose statin in the past. On Zetia, pravastatin, Lovaza. LDL 51  6. Kidney stone.   Follow up in one  Year.  Medication Adjustments/Labs and Tests Ordered: Current medicines are reviewed at length with the patient today.  Concerns regarding medicines are outlined above.  No orders of the defined types were placed in this encounter.  No orders of the defined types were placed in this encounter.   Signed, Sema Stangler [place], MD  08/05/2021 8:28 AM    [facility]

## 2021-08-05 ENCOUNTER — Ambulatory Visit (INDEPENDENT_AMBULATORY_CARE_PROVIDER_SITE_OTHER): Payer: Medicare Other | Admitting: Cardiology

## 2021-08-05 ENCOUNTER — Encounter: Payer: Self-pay | Admitting: Cardiology

## 2021-08-05 ENCOUNTER — Other Ambulatory Visit: Payer: Self-pay

## 2021-08-05 VITALS — BP 132/62 | HR 53 | Ht 66.0 in | Wt 165.0 lb

## 2021-08-05 DIAGNOSIS — I2510 Atherosclerotic heart disease of native coronary artery without angina pectoris: Secondary | ICD-10-CM

## 2021-08-05 DIAGNOSIS — E7800 Pure hypercholesterolemia, unspecified: Secondary | ICD-10-CM

## 2021-08-05 DIAGNOSIS — I2583 Coronary atherosclerosis due to lipid rich plaque: Secondary | ICD-10-CM

## 2021-08-05 DIAGNOSIS — I10 Essential (primary) hypertension: Secondary | ICD-10-CM | POA: Diagnosis not present

## 2021-08-05 DIAGNOSIS — E1169 Type 2 diabetes mellitus with other specified complication: Secondary | ICD-10-CM | POA: Diagnosis not present

## 2021-08-19 ENCOUNTER — Ambulatory Visit: Payer: Medicare Other | Admitting: Neurology

## 2022-09-07 NOTE — Progress Notes (Unsigned)
Cardiology Office Note:    Date:  08/05/2021   ID:  Jeffery Schmidt, DOB [birth year], MRN [id number]  PCP:  Michael Boston, MD  Cardiologist:  Denissa Cozart [place], MD   Referring MD: Michael Boston, MD   Chief Complaint  Patient presents with   Coronary Artery Disease    History of Present Illness:    Jeffery Schmidt is a 82 y.o. male with a hx of CAD, hypertension, OSA, and hyperlipidemia.  He had a bare-metal stent placed to the left circumflex in 1998 with subsequent stenting of the LAD and RCA in November 2015.  He has a history of diabetes type 2 and chronic diastolic dysfunction.  He was  seen  on 06/30/2016 after some brief episodes of chest discomfort.  Lexiscan Myoview was ordered and showed no signs of reversible ischemia and normal EF.    He was maintained on aspirin, beta-blocker,  ARB, statin, and Zetia.  He is s/p hypoglossal nerve stimulator (Inspire) placement for OSA.   He did get Covid 19 last month. His illness was fairly mild but his wife got quite ill with PNA.   He denies any chest pain, SOB, palpitations. 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 Has labs are followed at the [place]. Went on a [place] for awhile and A1c improved but potassium went up. In general he feels very.     Past Medical History:  Diagnosis Date   CAD (coronary artery disease) cardiologist---dr Martinique   a. 1998 s/p BMS to LCX;  b. 12/2012 MV: EF 68%, no ischemia;  c. 04/2014 Cath/PCI: LM nl, LAD 90p (3.0x18 Biotronik Orsiro DES - 3.5), LCX 38m patent stent, RCA  90 (2.5x18 Biotronik Orsiro DES - 2.75);   nuclear stress test 07-02-2016 -- low risk with no evidence reversible ischemia, nuclear ef [id number]  Complication of anesthesia    anesthesia delirium after heart cath (hx ptsd)   Diastolic CHF, chronic ([place]    followed by dr jMartinique-- last echo 05-06-2014 in epic, ef 60-65%, G1DD, mild MR, mild LAE   First degree heart block    Frequency of urination    History of kidney stones    HLD (hyperlipidemia)    HTN  (hypertension)    Left ureteral calculus    Nocturia    OA (osteoarthritis)    Obstructive sleep apnea followed by dr dohmeier   study 11-18-2014 in epic, moderate osa on cpap;  pt intolerant to cpap, pt is s/p hypoglossal nerver stimulator implantation (Inspire) on 06-12-2018 (pt re-titated 02-27-2019)  PT HAS A CONTROL TO DEVICE   PTSD (post-traumatic stress disorder)    S/p bare metal coronary artery stent 1998   to LCx   S/P drug eluting coronary stent placement 05/07/2014   to pLAD and mRCA   [place] 2 diabetes mellitus [place]    followed by pcp--- (10-10-2019 checks blood sugar 3 times per week in am,  fasting sugar-- 89-95   Wears glasses    Wears hearing aid in both ears     Past Surgical History:  Procedure Laterality Date   BLEPHAROPLASTY Bilateral 2000's   upper   [place]  [place]   [place]   PCI w/ DES  to proximal LAD and mid RCA, patent LCX stent    CYSTOSCOPY WITH RETROGRADE PYELOGRAM, URETEROSCOPY AND STENT PLACEMENT Left 10/12/2019  Procedure: CYSTOSCOPY WITH RETROGRADE PYELOGRAM, URETEROSCOPY AND STENT PLACEMENT;  Surgeon: Alexis Frock, MD;  Location: [facility];  Service: Urology;  Laterality: Left;   DRUG INDUCED ENDOSCOPY N/A 02/22/2018   Procedure: DRUG INDUCED ENDOSCOPY;  Surgeon: Melida Quitter, MD;  Location: [place];  Service: ENT;  Laterality: N/A;   EXTRACORPOREAL SHOCK WAVE LITHOTRIPSY  1990s   HOLMIUM LASER APPLICATION Left [id number]   Procedure: HOLMIUM LASER APPLICATION;  Surgeon: Alexis Frock, MD;  Location: [facility];  Service: Urology;  Laterality: Left;   LEFT HEART CATHETERIZATION WITH CORONARY ANGIOGRAM N/A 05/07/2014   Procedure: LEFT HEART CATHETERIZATION WITH CORONARY ANGIOGRAM;  Surgeon: Baelynn Schmuhl M [place], MD;  Location: [facility] CATH LAB;  Service:  Cardiovascular;  Laterality: N/A;   PERCUTANEOUS CORONARY STENT INTERVENTION (PCI-S)  05/07/2014   Procedure: PERCUTANEOUS CORONARY STENT INTERVENTION (PCI-S);  Surgeon: Aseel Truxillo M [place], MD;  Location[facility] C[facility] LAB;  Service: Cardiovascular;;  Drug eluting stent Prox LAD (3.0/18 Orsiro) and drug eluting stent to Mid RCA (2.5/18 Orsiro)    Current Medications: Current Meds  Medication Sig   amLODipine (NORVASC) 10 MG tablet Take 10 mg by mouth daily.    Ascorbic Acid (VITAMIN C PO) Take 500 mg by mouth daily.    aspirin 81 MG tablet Take 81 mg by mouth daily.   Cholecalciferol (VITAMIN D3) 125 MCG (5000 UT) CAPS Take 1 capsule by mouth every other day.   Coenzyme Q10 (COQ10 PO) Take 1 capsule by mouth daily.    ezetimibe (ZETIA) 10 MG tablet Take 10 mg by mouth daily.    Glucos-Chondroit-Hyaluron-MSM (GLUCOSAMINE CHONDROITIN JOINT PO) Take 1 tablet by mouth daily.   hydrochlorothiazide (HYDRODIURIL) 25 MG tablet Take 25 mg by mouth daily.    ketorolac (TORADOL) 10 MG tablet Take 1 tablet (10 mg total) by mouth every 8 (eight) hours as needed for moderate pain. Or stent discomfort post-operatively   losartan (COZAAR) 100 MG tablet Take 100 mg by mouth daily.    metFORMIN (GLUCOPHAGE) 500 MG tablet Take 500 mg by mouth daily with breakfast.    metoprolol succinate (TOPROL-XL) 100 MG 24 hr tablet Take 100 mg by mouth daily.    Omega-3 Fatty Acids (FISH OIL) 1200 MG CAPS Take 1,200 mg by mouth daily.   rosuvastatin (CRESTOR) 10 MG tablet TAKE ONE-HALF TABLET BY MOUTH ONCE A DAY FOR CHOLESTEROL   senna-docusate (SENOKOT-S) 8.6-50 MG tablet Take 1 tablet by mouth 2 (two) times daily. While taking strongest pain meds to prevent costipation.     Allergies:   Brilinta [ticagrelor] and Epinephrine   Social History   Socioeconomic History   Marital status: Married    Spouse name: [name]   Number of children: 6   Years of education: Not on file   Highest education level: Not on file  Occupational  History   Occupation: retired  Tobacco Use   Smoking status: Never   Smokeless tobacco: Never  Vaping Use   Vaping Use: Never used  Substance and Sexual Activity   Alcohol use: No   Drug use: Never   Sexual activity: Not on file  Other Topics Concern   Not on file  Social History Narrative   Not on file   Social Determinants of Health   Financial Resource Strain: Not on file  Food Insecurity: Not on file  Transportation Needs: Not on file  Physical Activity: Not on file  Stress: Not on file  Social Connections: Not on file  Family History: The patient's family history includes Coronary artery disease in an other family member; Heart disease in an other family member; Hypertension in his mother and another family member; Other in his father; Stroke in an other family member.  ROS:   Please see the history of present illness.     All other systems reviewed and are negative.  EKGs/Labs/Other Studies Reviewed:    The following studies were reviewed today:  Myoview 07/02/16: The left ventricular ejection fraction is normal (55-65%). Nuclear stress EF: 60%. There was no ST segment deviation noted during stress. The study is normal. This is a low risk study.   Normal resting and stress perfusion. No ischemia or infarction EF 60%   EKG:  EKG is  ordered today.  Sinus brady rate 53. First degree AV block. Otherwise normal.  I have personally reviewed and interpreted this study.   Recent Labs: No results found for requested labs within last 8760 hours.  Recent Lipid Panel    Component Value Date/Time   CHOL 173 05/09/2014 0244   TRIG 222 (H) 05/09/2014 0244   HDL 36 (L) 05/09/2014 0244   CHOLHDL 4.8 05/09/2014 0244   VLDL 44 (H) 05/09/2014 0244   LDLCALC 93 05/09/2014 0244  dated 03/31/18: cholesterol 175, triglycerides 167, HDL 53, LDL 95. A1c 6.1%. Hgb and chemistries normal.  Dated 04/03/19: CBC normal. Dated 07/17/19: cholesterol 147, triglycerides 167, HDL 42,  LDL 72. A1c 6%. Creatinine 1.28. otherwise CMET normal.  Dated 07/11/20: cholesterol 121, triglycerides 127, HDL 45, LDL 51. Normal TSH Dated 01/28/21: CMET normal. A1c 6.9%.   Physical Exam:    VS:  BP 132/62   Pulse (!) 53   Ht '5\' 6"'$  (1.676 m)   Wt 165 lb (74.8 kg)   SpO2 95%   BMI 26.63 kg/m     Wt Readings from Last 3 Encounters:  08/05/21 165 lb (74.8 kg)  03/27/20 163 lb (73.9 kg)  10/12/19 170 lb 6.4 oz (77.3 kg)    GENERAL:  Well appearing WM in NAD HEENT:  PERRL, EOMI, sclera are clear. Oropharynx is clear. NECK:  No jugular venous distention, carotid upstroke brisk and symmetric, no bruits, no thyromegaly or adenopathy LUNGS:  Clear to auscultation bilaterally CHEST:  Unremarkable HEART:  RRR,  PMI not displaced or sustained,S1 and S2 within normal limits, no S3, no S4: no clicks, no rubs, no murmurs ABD:  Soft, nontender. BS +, no masses or bruits. No hepatomegaly, no splenomegaly EXT:  2 + pulses throughout, no edema, no cyanosis no clubbing SKIN:  Warm and dry.  No rashes NEURO:  Alert and oriented x 3. Cranial nerves II through XII intact. PSYCH:  Cognitively intact    ASSESSMENT:    1. Coronary artery disease due to lipid rich plaque   2. Essential hypertension   3. Hypercholesterolemia   4. Type 2 diabetes mellitus with other specified complication, without long-term current use of insulin (HCC)     PLAN:    In order of problems listed above:  1. Coronary artery disease. S/p multiple stents in the past. Last in 2015.  Myoview in January 2018 was negative for reversible ischemia with normal LVEF.  He is asymptomatic. Continue ASA  2. Essential hypertension BP is well controlled per patient report, no medication changes.    3. Obstructive sleep apnea Unable to tolerate CPAP or dental appliance.  s/p placement of Inspire device implanted per ENT.  4. Type 2 diabetes mellitus with other specified complication,  without long-term current use of insulin  ([place]) Per primary. On metformin  5. Hypercholesterolemia. Intolerant of high dose statin in the past. On Zetia, pravastatin, Lovaza. LDL 51  6. Kidney stone.   Follow up in one  Year.  Medication Adjustments/Labs and Tests Ordered: Current medicines are reviewed at length with the patient today.  Concerns regarding medicines are outlined above.  No orders of the defined types were placed in this encounter.  No orders of the defined types were placed in this encounter.   Signed, Emmelia Holdsworth [place], MD  08/05/2021 8:28 AM    [facility]

## 2022-09-08 ENCOUNTER — Encounter: Payer: Self-pay | Admitting: Cardiology

## 2022-09-08 ENCOUNTER — Ambulatory Visit: Payer: Medicare Other | Attending: Cardiology | Admitting: Cardiology

## 2022-09-08 VITALS — BP 138/68 | HR 53 | Ht 66.0 in | Wt 162.2 lb

## 2022-09-08 DIAGNOSIS — I2510 Atherosclerotic heart disease of native coronary artery without angina pectoris: Secondary | ICD-10-CM

## 2022-09-08 DIAGNOSIS — E1169 Type 2 diabetes mellitus with other specified complication: Secondary | ICD-10-CM | POA: Diagnosis not present

## 2022-09-08 DIAGNOSIS — I2583 Coronary atherosclerosis due to lipid rich plaque: Secondary | ICD-10-CM | POA: Diagnosis not present

## 2022-09-08 DIAGNOSIS — I10 Essential (primary) hypertension: Secondary | ICD-10-CM | POA: Diagnosis not present

## 2022-09-08 DIAGNOSIS — E7800 Pure hypercholesterolemia, unspecified: Secondary | ICD-10-CM | POA: Diagnosis not present

## 2022-10-05 ENCOUNTER — Ambulatory Visit: Payer: Medicare Other | Admitting: Cardiology

## 2022-11-24 ENCOUNTER — Telehealth: Payer: Self-pay | Admitting: Neurology

## 2022-11-24 NOTE — Telephone Encounter (Signed)
Patient left a voicemail on my phone asking for me to call him back with no details.  I called the patient back but he stated that he has seen Dr. Vickey Huger in the past and needs to make an appointment with him but he has inspired now. But since he last saw Dr. Vickey Huger in 2021 I was not sure if he needed a new referral?

## 2022-11-25 NOTE — Telephone Encounter (Signed)
Called pt LVM to call office

## 2023-02-10 ENCOUNTER — Ambulatory Visit: Payer: Medicare Other | Admitting: Neurology

## 2023-03-08 ENCOUNTER — Ambulatory Visit: Payer: Medicare Other | Admitting: Neurology

## 2023-03-08 ENCOUNTER — Telehealth: Payer: Self-pay | Admitting: Neurology

## 2023-03-08 ENCOUNTER — Encounter: Payer: Self-pay | Admitting: Neurology

## 2023-03-08 VITALS — Ht 66.0 in | Wt 161.0 lb

## 2023-03-08 DIAGNOSIS — I25119 Atherosclerotic heart disease of native coronary artery with unspecified angina pectoris: Secondary | ICD-10-CM | POA: Diagnosis not present

## 2023-03-08 DIAGNOSIS — Z9682 Presence of neurostimulator: Secondary | ICD-10-CM | POA: Insufficient documentation

## 2023-03-08 DIAGNOSIS — G4733 Obstructive sleep apnea (adult) (pediatric): Secondary | ICD-10-CM | POA: Diagnosis not present

## 2023-03-08 DIAGNOSIS — G4734 Idiopathic sleep related nonobstructive alveolar hypoventilation: Secondary | ICD-10-CM

## 2023-03-08 NOTE — Telephone Encounter (Signed)
See below for the inspire information.

## 2023-03-08 NOTE — Progress Notes (Addendum)
SLEEP CLINIC VISIT     Provider:  Melvyn Novas, MD  Primary Care Physician:  Wilfred Curtis, MD [address] [place] [address]     Referring Provider: Wilfred Curtis, Md [address] [place],  [place] [address]          Chief Complaint according to patient   Patient presents with:     New Patient- 3 year hiatus in sleep clinic.            HISTORY OF PRESENT ILLNESS:  Jeffery Schmidt is a 82 y.o. male patient who is here for a new visit after a 3 year hiatus on  03/08/2023.  Chief concern according to patient :  patient here after a 3 year hiatus for inspire re investigation. Uses it nightly, he stated but download shows 2 nights out 21 skipped. 90% compliance is still excellent! Current setting  2.8 V ,  pulse width of 90 microseconds. Frequency 33 hz,  20 minutes start delay and 8 h therapy duration.   Wave form obtained.  Attached.    GDS 1/ 15 points. Epworth sleepiness score: 5/ 24. FSS at 9/ 63 points. No side effects or tolerance issues.   04-06-2020-   Interval history : Jeffery Schmidt is a 82 y.o. male patient  , whom I had met before upon referral from ENT specialist Dr Jenne Pane, MD. patient was here overnight for the titration of Inspire- has not reached full potential. I have the pleasure of meeting Mr. Lonell Grandchild today who has by now seen [place] for about 18 months he was titrated on 01/20/2020 in the office setting an overnight sleep lab stay to a setting up to 2.8 V.  The patient has experienced a reduced apnea index on the each setting the ultimate goal is now defined to be a setting of 2.8 or maybe 2.9 V.  He tolerated the higher settings without difficulty at night and he was told that he can at home slowly change and advance the settings.  His AHI for this inspire titration was 7.6 overall but his AHI at the 2.8 multilevel was 5/h which is a fine resolution of apnea.  He does have Cheyne-Stokes breathing of form of central apnea but is not responsive to the  inspire device but his obstructive apneas have all resolved.   Mr. Gaspar Garbe has been very happy with the inspire therapy, he no longer falls asleep at red lights he noted.  His Epworth sleepiness score was today endorsed at 2 out of 24 points very great reduction his fatigue severity scale used to be in the 50s and now is 9 points.  This is the lowest achievable.  So overall he has clear clinical benefit, his obstructive sleep apnea is resolved, he is planning to get the Covid booster shot and may be combining it with the flu shot .   Melvyn Novas, MD  04-06-20.      PS : His daughter died of Covid 92 he learnt , he was infected, too.  Tested Ab Positive.  HPI:  Jeffery Schmidt is a 82 y.o. male patient  , whom I had met before upon referral from ENT specialist Dr Jenne Pane, MD.  Mr. Lonell Grandchild reported that he had been previously multiple times assessed for sleep apnea he started using CPAP 5 years ago but could not tolerate positive airway pressure therapy.  His sleep was too interrupted he did not feel restored or refreshed and therefore discontinued the  use.  He also attempted to improve with a dental device which also has failed.  In December I saw him briefly in the sleep lab outside the regular sleep clinic appointments and he had just been implanted with an inspire device.  The device was turned on in January 2020.  The patient reports tolerating the device very well his only concern was that he had already wore out the batteries of the remote control.     Sleep and medical history; see above. Dr. Peter Swaziland is his cardiologist; Dr. Christia Reading his ENT physician.   He has a longstanding history of known coronary artery disease diagnosed first in 1998 according to my records.  In 2014 he had an ejection fraction of 68 no signs of ischemia in November 2015 he had a cardiac catheterization and percutaneous stent placement.  LAD and left circumflex were stented right coronary artery, with a  Biotronik posterior device.  The patient carries a diagnosis of mild mitral valve regurgitation and a mildly dilated  left atrium.  After the heart catheterization he had a brief delirium.  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   I reviewed the patient's current medications in detail. OR note reviewed. Cardiac visit note reviewed.  Family sleep history: mother with HTN, CAD and CVA    Social history:  Very remote smoking history, never more than 5 a day. Caffeine occasionally, ETOH : none.   Sleep habits are as follows: Bedtime is between 10-11 Pm and going to sleep is not a problem. With the inspire device he has been snoring less and sleeping deeper, feels refreshed and restored, averaging 7 hours of sleep with fewer nocturia.           Review of Systems: Out of a complete 14 system review, the patient complains of only the following symptoms, and all other reviewed systems are negative.:    Social History   Socioeconomic History   Marital status: Married    Spouse name: Jeffery Schmidt   Number of children: 6   Years of education: Not on file   Highest education level: Not on file  Occupational History   Occupation: retired  Tobacco Use   Smoking status: Never   Smokeless tobacco: Never  Vaping Use   Vaping status: Never Used  Substance and Sexual Activity   Alcohol use: No   Drug use: Never   Sexual activity: Not on file  Other Topics Concern   Not on file  Social History Narrative   Not on file   Social Determinants of Health   Financial Resource Strain: Patient Declined (07/07/2022)   Received from [facility], [facility]   Overall Financial Resource Strain (CARDIA)    Difficulty of Paying Living Expenses: Patient declined  Food Insecurity: Patient Declined (07/07/2022)   Received from [facility], [facility]   Hunger Vital  Sign    Worried About Running Out of Food in the Last Year: Patient declined    Ran Out of Food in the Last Year: Patient declined  Transportation Needs: Patient Declined (07/07/2022)   Received from [employer], [facility]   PRAPARE - Transportation    Lack of Transportation (Medical): Patient declined    Lack of Transportation (Non-Medical): Patient declined  Physical Activity: Insufficiently Active (01/04/2022)   [facility], [facility]   Exercise Vital Sign    Days of Exercise per Week: 3  days    Minutes of Exercise per Session: 30 min  Stress: Not on file  Social Connections: Unknown (01/05/2023)   Received from [facility]   Social Network    Social Network: Not on file    Family History  Problem Relation Age of Onset   Hypertension Mother    Coronary artery disease Other    Heart disease Other    Hypertension Other    Stroke Other    Other Father        unknown    Past Medical History:  Diagnosis Date   CAD (coronary artery disease) cardiologist---dr Swaziland   a. 1998 s/p BMS to LCX;  b. 12/2012 MV: EF 68%, no ischemia;  c. 04/2014 Cath/PCI: LM nl, LAD 90p (3.0x18 Biotronik Orsiro DES - 3.5), LCX 40m, patent stent, RCA  90 (2.5x18 Biotronik Orsiro DES - 2.75);   nuclear stress test 07-02-2016 -- low risk with no evidence reversible ischemia, nuclear ef 60%   Complication of anesthesia    anesthesia delirium after heart cath (hx ptsd)   Diastolic CHF, chronic (HCC)    followed by dr Swaziland--- last echo 05-06-2014 in epic, ef 60-65%, G1DD, mild MR, mild LAE   First degree heart block    Frequency of urination    History of kidney stones    HLD (hyperlipidemia)    HTN (hypertension)    Left ureteral calculus    Nocturia    OA (osteoarthritis)    Obstructive sleep apnea followed by dr Danalee Flath   study 11-18-2014 in epic, moderate osa on cpap;  pt intolerant to cpap, pt is s/p hypoglossal nerver stimulator implantation (Inspire) on 06-12-2018 (pt  re-titated 02-27-2019)  PT HAS A CONTROL TO DEVICE   PTSD (post-traumatic stress disorder)    S/p bare metal coronary artery stent 1998   to LCx   S/P drug eluting coronary stent placement 05/07/2014   to pLAD and mRCA   Type 2 diabetes mellitus (HCC)    followed by pcp--- (10-10-2019 checks blood sugar 3 times per week in am,  fasting sugar-- 89-95   Wears glasses    Wears hearing aid in both ears     Past Surgical History:  Procedure Laterality Date   BLEPHAROPLASTY Bilateral 2000's   upper   COLONOSCOPY  last one 04-21-2017 in epic   CORONARY ANGIOPLASTY WITH STENT PLACEMENT  1998   PTCA w/ BMS to LCx   CORONARY ANGIOPLASTY WITH STENT PLACEMENT  05/07/14   PCI w/ DES  to proximal LAD and mid RCA, patent LCX stent    CYSTOSCOPY WITH RETROGRADE PYELOGRAM, URETEROSCOPY AND STENT PLACEMENT Left 10/12/2019   Procedure: CYSTOSCOPY WITH RETROGRADE PYELOGRAM, URETEROSCOPY AND STENT PLACEMENT;  Surgeon: Sebastian Ache, MD;  Location: [facility];  Service: Urology;  Laterality: Left;   DRUG INDUCED ENDOSCOPY N/A 02/22/2018   Procedure: DRUG INDUCED ENDOSCOPY;  Surgeon: Christia Reading, MD;  Location: [facility];  Service: ENT;  Laterality: N/A;   EXTRACORPOREAL SHOCK WAVE LITHOTRIPSY  1990s   HOLMIUM LASER APPLICATION Left 10/12/2019   Procedure: HOLMIUM LASER APPLICATION;  Surgeon: Sebastian Ache, MD;  Location: [facility];  Service: Urology;  Laterality: Left;   LEFT HEART CATHETERIZATION WITH CORONARY ANGIOGRAM N/A 05/07/2014   Procedure: LEFT HEART CATHETERIZATION WITH CORONARY ANGIOGRAM;  Surgeon: Peter M Swaziland, MD;  Location: [facility] CATH LAB;  Service: Cardiovascular;  Laterality: N/A;   PERCUTANEOUS CORONARY STENT INTERVENTION (PCI-S)  05/07/2014   Procedure: PERCUTANEOUS CORONARY STENT  INTERVENTION (PCI-S);  Surgeon: Peter M Swaziland, MD;  Location: [facility] CATH LAB;  Service: Cardiovascular;;  Drug eluting stent Prox LAD (3.0/18 Orsiro) and drug  eluting stent to Mid RCA (2.5/18 Orsiro)     Current Outpatient Medications on File Prior to Visit  Medication Sig Dispense Refill   amLODipine (NORVASC) 10 MG tablet Take 10 mg by mouth daily.      aspirin 81 MG tablet Take 81 mg by mouth daily.     empagliflozin (JARDIANCE) 10 MG TABS tablet Take 10 mg by mouth daily.     ezetimibe (ZETIA) 10 MG tablet Take 10 mg by mouth daily.      hydrochlorothiazide (HYDRODIURIL) 25 MG tablet TAKE ONE-HALF TABLET BY MOUTH DAILY FOR BLOOD PRESSURE     losartan (COZAAR) 100 MG tablet Take 100 mg by mouth daily.      Melatonin 3 MG CAPS Take 3 mg by mouth at bedtime.     metFORMIN (GLUCOPHAGE) 500 MG tablet Take 500 mg by mouth daily with breakfast.      metoprolol succinate (TOPROL-XL) 100 MG 24 hr tablet Take 100 mg by mouth daily.      Multiple Vitamin tablet Take 1 tablet by mouth daily.     rosuvastatin (CRESTOR) 10 MG tablet TAKE ONE-HALF TABLET BY MOUTH ONCE A DAY FOR CHOLESTEROL     No current facility-administered medications on file prior to visit.    Allergies  Allergen Reactions   Brilinta [Ticagrelor] Shortness Of Breath   Epinephrine Anaphylaxis and Other (See Comments)    Pt states he is anaphyactic     DIAGNOSTIC DATA (LABS, IMAGING, TESTING) - I reviewed patient records, labs, notes, testing and imaging myself where available.  Lab Results  Component Value Date   WBC 6.0 06/06/2018   HGB 14.3 10/12/2019   HCT 42.0 10/12/2019   MCV 93.4 06/06/2018   PLT 228 06/06/2018      Component Value Date/Time   NA 140 10/12/2019 0709   K 3.5 10/12/2019 0709   CL 102 10/12/2019 0709   CO2 25 06/06/2018 1020   GLUCOSE 143 (H) 10/12/2019 0709   BUN 26 (H) 10/12/2019 0709   CREATININE 1.20 10/12/2019 0709   CALCIUM 9.6 06/06/2018 1020   PROT 7.3 05/12/2014 0020   ALBUMIN 3.8 05/12/2014 0020   AST 21 05/12/2014 0020   ALT 24 05/12/2014 0020   ALKPHOS 68 05/12/2014 0020   BILITOT 1.4 (H) 05/12/2014 0020   GFRNONAA NOT  CALCULATED 06/06/2018 1020   GFRAA NOT CALCULATED 06/06/2018 1020   Lab Results  Component Value Date   CHOL 173 05/09/2014   HDL 36 (L) 05/09/2014   LDLCALC 93 05/09/2014   TRIG 222 (H) 05/09/2014   CHOLHDL 4.8 05/09/2014   Lab Results  Component Value Date   HGBA1C 7.2 (H) 05/06/2014   No results found for: "VITAMINB12" No results found for: "TSH"  PHYSICAL EXAM:  Today's Vitals   03/08/23 1437  Weight: 161 lb (73 kg)  Height: 5\' 6"  (1.676 m)   Body mass index is 25.99 kg/m.   Wt Readings from Last 3 Encounters:  03/08/23 161 lb (73 kg)  09/08/22 162 lb 3.2 oz (73.6 kg)  08/05/21 165 lb (74.8 kg)     Ht Readings from Last 3 Encounters:  03/08/23 5\' 6"  (1.676 m)  09/08/22 5\' 6"  (1.676 m)  08/05/21 5\' 6"  (1.676 m)      General: The patient is awake, alert and appears not in acute distress.  The patient is well groomed. Head: Normocephalic, atraumatic. Neck is supple. Mallampati 2,  neck circumference:17 inches . Nasal airflow  patent.  Retrognathia is not seen. Tongue in midline.    This Earnest Bailey is  here after a 3 year hiatus for inspire re investigation. Uses it nightly, he stated but download shows 2 nights out 21 skipped. 90% compliance is still excellent! Current setting  2.8 V ,  pulse width of 90 microseconds. Frequency 33 hz,  20 minutes start delay and 8 h therapy duration.   Wave form obtained.  Attached.    GDS 1/ 15 points. Epworth sleepiness score: 5/ 24. FSS at 9/ 63 points. No side effects or tolerance issues.     ASSESSMENT AND PLAN 82 y.o. year old male  here with:    1) No changes in settings needed, patient feels happy with his sleep quality, duration.    RV in 12 months through our NP  or MD.   I would like to thank Dr Jenne Pane and  Wilfred Curtis, Md [address] [place],  [place] [address] for allowing me to meet with and to take care of this pleasant patient.     After spending a total time of  22  minutes face to face and additional  time for physical and neurologic examination, review of laboratory studies,  personal review of imaging studies, reports and results of other testing and review of referral information / records as far as provided in visit,   Electronically signed by: Melvyn Novas, MD 03/08/2023 3:31 PM  [facility] and [facility] Board certified by The [employer] of Sleep Medicine and Diplomate of the [employer] of Sleep Medicine. Board certified In Neurology through the ABPN, Fellow of the [employer] of Neurology.

## 2023-09-09 NOTE — Progress Notes (Unsigned)
 Cardiology Office Note:    Date:  09/14/2023   ID:  Jeffery Schmidt, DOB 04/06/1941, MRN [id number]  PCP:  Wilfred Curtis, MD  Cardiologist:  Karmah Potocki [place], MD   Referring MD: Wilfred Curtis, MD   Chief Complaint  Patient presents with   Coronary Artery Disease    History of Present Illness:    Jeffery Schmidt is a 83 y.o. male with a hx of CAD, hypertension, OSA, and hyperlipidemia.  He had a bare-metal stent placed to the left circumflex in 1998 with subsequent stenting of the LAD and RCA in November 2015.  He has a history of diabetes type 2 and chronic diastolic dysfunction.  He was  seen  on 06/30/2016 after some brief episodes of chest discomfort.  Lexiscan Myoview was ordered and showed no signs of reversible ischemia and normal EF.    He was maintained on aspirin, beta-blocker,  ARB, statin, and Zetia.  He is s/p hypoglossal nerve stimulator (Inspire) placement for OSA.   He is doing very well. Denies any chest pain, SOB, dizziness. Followed by PCP and at the [place].  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     Past Medical History:  Diagnosis Date   CAD (coronary artery disease) cardiologist---dr [place]   a. 1998 s/p BMS to LCX;  b. 12/2012 MV: EF 68%, no ischemia;  c. 04/2014 Cath/PCI: LM nl, LAD 90p (3.0x18 Biotronik Orsiro DES - 3.5), LCX 44m, patent stent, RCA  90 (2.5x18 Biotronik Orsiro DES - 2.75);   nuclear stress test 07-02-2016 -- low risk with no evidence reversible ischemia, nuclear ef 60%   Complication of anesthesia    anesthesia delirium after heart cath (hx ptsd)   Diastolic CHF, chronic (HCC)    followed by dr [place]--- last echo 05-06-2014 in epic, ef 60-65%, G1DD, mild MR, mild LAE   First degree heart block    Frequency of urination    History of kidney stones    HLD (hyperlipidemia)    HTN (hypertension)    Left ureteral calculus    Nocturia    OA (osteoarthritis)    Obstructive sleep apnea followed by dr dohmeier   study 11-18-2014 in epic, moderate  osa on cpap;  pt intolerant to cpap, pt is s/p hypoglossal nerver stimulator implantation (Inspire) on 06-12-2018 (pt re-titated 02-27-2019)  PT HAS A CONTROL TO DEVICE   PTSD (post-traumatic stress disorder)    S/p bare metal coronary artery stent 1998   to LCx   S/P drug eluting coronary stent placement 05/07/2014   to pLAD and mRCA   Type 2 diabetes mellitus (HCC)    followed by pcp--- (10-10-2019 checks blood sugar 3 times per week in am,  fasting sugar-- 89-95   Wears glasses    Wears hearing aid in both ears     Past Surgical History:  Procedure Laterality Date   BLEPHAROPLASTY Bilateral 2000's   upper   COLONOSCOPY  last one 04-21-2017 in epic   CORONARY ANGIOPLASTY WITH STENT PLACEMENT  1998   PTCA w/ BMS to LCx   CORONARY ANGIOPLASTY WITH STENT PLACEMENT  05/07/14   PCI w/ DES  to proximal LAD and mid RCA, patent LCX stent    CYSTOSCOPY WITH RETROGRADE PYELOGRAM, URETEROSCOPY AND STENT PLACEMENT Left 10/12/2019   Procedure: CYSTOSCOPY WITH RETROGRADE PYELOGRAM, URETEROSCOPY AND STENT PLACEMENT;  Surgeon: Sebastian Ache, MD;  Location: [facility];  Service: Urology;  Laterality: Left;   DRUG INDUCED ENDOSCOPY N/A 02/22/2018  Procedure: DRUG INDUCED ENDOSCOPY;  Surgeon: Christia Reading, MD;  Location: [facility];  Service: ENT;  Laterality: N/A;   EXTRACORPOREAL SHOCK WAVE LITHOTRIPSY  1990s   HOLMIUM LASER APPLICATION Left 10/12/2019   Procedure: HOLMIUM LASER APPLICATION;  Surgeon: Sebastian Ache, MD;  Location: [facility];  Service: Urology;  Laterality: Left;   LEFT HEART CATHETERIZATION WITH CORONARY ANGIOGRAM N/A 05/07/2014   Procedure: LEFT HEART CATHETERIZATION WITH CORONARY ANGIOGRAM;  Surgeon: Gloriana Piltz M [place], MD;  Location: [facility] CATH LAB;  Service: Cardiovascular;  Laterality: N/A;   PERCUTANEOUS CORONARY STENT INTERVENTION (PCI-S)  05/07/2014   Procedure: PERCUTANEOUS CORONARY STENT INTERVENTION (PCI-S);  Surgeon: Bonnita Newby M  [place], MD;  Location: [facility] CATH LAB;  Service: Cardiovascular;;  Drug eluting stent Prox LAD (3.0/18 Orsiro) and drug eluting stent to Mid RCA (2.5/18 Orsiro)    Current Medications: Current Meds  Medication Sig   amLODipine (NORVASC) 10 MG tablet Take 10 mg by mouth daily.    aspirin 81 MG tablet Take 81 mg by mouth daily.   empagliflozin (JARDIANCE) 10 MG TABS tablet Take 10 mg by mouth daily.   ezetimibe (ZETIA) 10 MG tablet Take 10 mg by mouth daily.    hydrochlorothiazide (HYDRODIURIL) 25 MG tablet TAKE ONE-HALF TABLET BY MOUTH DAILY FOR BLOOD PRESSURE   losartan (COZAAR) 100 MG tablet Take 100 mg by mouth daily.    Melatonin 3 MG CAPS Take 3 mg by mouth at bedtime.   metFORMIN (GLUCOPHAGE) 500 MG tablet Take 500 mg by mouth daily with breakfast.    metoprolol succinate (TOPROL-XL) 100 MG 24 hr tablet Take 100 mg by mouth daily.    Multiple Vitamin tablet Take 1 tablet by mouth daily.   rosuvastatin (CRESTOR) 10 MG tablet TAKE ONE-HALF TABLET BY MOUTH ONCE A DAY FOR CHOLESTEROL     Allergies:   Brilinta [ticagrelor] and Epinephrine   Social History   Socioeconomic History   Marital status: Married    Spouse name: Marylu Lund   Number of children: 6   Years of education: Not on file   Highest education level: Not on file  Occupational History   Occupation: retired  Tobacco Use   Smoking status: Never   Smokeless tobacco: Never  Vaping Use   Vaping status: Never Used  Substance and Sexual Activity   Alcohol use: No   Drug use: Never   Sexual activity: Yes    Partners: Female    Comment: MARRIED  Other Topics Concern   Not on file  Social History Narrative   Not on file   Social Drivers of Health   Financial Resource Strain: Low Risk  (07/06/2023)   Received from [facility]   Overall Financial Resource Strain (CARDIA)    Difficulty of Paying Living Expenses: Not hard at all  Food Insecurity: No Food Insecurity (07/06/2023)   [facility] from [facility]   Hunger Vital  Sign    Worried About Running Out of Food in the Last Year: Never true    Ran Out of Food in the Last Year: Never true  Transportation Needs: No Transportation Needs (07/06/2023)   Received from [facility] - Transportation    Lack of Transportation (Medical): No    Lack of Transportation (Non-Medical): No  Physical Activity: Sufficiently Active (06/07/2023)   Received from [facility]   Exercise Vital Sign    Days of Exercise per Week: 7 days    Minutes of Exercise per Session: 150+ min  Stress: No Stress Concern Present (06/07/2023)   Received from [facility] - Occupational Stress Questionnaire    Feeling of Stress : Not at all  Social Connections: Socially Integrated (06/07/2023)   Received from [facility]   Social Network    How would you rate your social network (family, work, friends)?: Good participation with social networks     Family History: The patient's family history includes Coronary artery disease in an other family member; Heart disease in an other family member; Hypertension in his mother and another family member; Other in his father; Stroke in an other family member.  ROS:   Please see the history of present illness.     All other systems reviewed and are negative.  EKGs/Labs/Other Studies Reviewed:    The following studies were reviewed today:  Myoview 07/02/16: The left ventricular ejection fraction is normal (55-65%). Nuclear stress EF: 60%. There was no ST segment deviation noted during stress. The study is normal. This is a low risk study.   Normal resting and stress perfusion. No ischemia or infarction EF 60%   EKG Interpretation Date/Time:  Wednesday September 14 2023 09:34:30 EDT Ventricular Rate:  52 PR Interval:  346 QRS Duration:  86 QT Interval:  446 QTC Calculation: 414 R Axis:   -10  Text Interpretation: Sinus bradycardia with 1st degree A-V block Nonspecific T wave abnormality  When compared with ECG of 16-Feb-2018 11:18, No significant change was found Confirmed by [place], Rozann Holts [phone]) on 09/14/2023 9:48:33 AM     Recent Labs: No results found for requested labs within last 365 days.  Recent Lipid Panel    Component Value Date/Time   CHOL 173 05/09/2014 0244   TRIG 222 (H) 05/09/2014 0244   HDL 36 (L) 05/09/2014 0244   CHOLHDL 4.8 05/09/2014 0244   VLDL 44 (H) 05/09/2014 0244   LDLCALC 93 05/09/2014 0244  dated 03/31/18: cholesterol 175, triglycerides 167, HDL 53, LDL 95. A1c 6.1%. Hgb and chemistries normal.  Dated 04/03/19: CBC normal. Dated 07/17/19: cholesterol 147, triglycerides 167, HDL 42, LDL 72. A1c 6%. Creatinine 1.28. otherwise CMET normal.  Dated 07/11/20: cholesterol 121, triglycerides 127, HDL 45, LDL 51. Normal TSH Dated 01/28/21: CMET normal. A1c 6.9%.  Dated 07/02/22: CBC normal. Cholesterol 136, triglycerides 165, HDL 45, LDL 63. Glucose 157, BUN 21, creatinine 1.39. otherwise CMET normal. A1c 7.2% Dated 05/31/23: A1c 6.5%. cholesterol 151, triglycerides 197, HDL 48, LDL 70. Glucose 164, BUN 24, creatinine 1.21. otherwise CBC and CMET normal.   Physical Exam:    VS:  BP (!) 141/75   Pulse (!) 52   Ht 5\' 6"  (1.676 m)   Wt 164 lb (74.4 kg)   SpO2 98%   BMI 26.47 kg/m     Wt Readings from Last 3 Encounters:  09/14/23 164 lb (74.4 kg)  03/08/23 161 lb (73 kg)  09/08/22 162 lb 3.2 oz (73.6 kg)    GENERAL:  Well appearing WM in NAD HEENT:  PERRL, EOMI, sclera are clear. Oropharynx is clear. NECK:  No jugular venous distention, carotid upstroke brisk and symmetric, no bruits, no thyromegaly or adenopathy LUNGS:  Clear to auscultation bilaterally CHEST:  Unremarkable HEART:  RRR,  PMI not displaced or sustained,S1 and S2 within normal limits, no S3, no S4: no clicks, no rubs, no murmurs ABD:  Soft, nontender. BS +, no masses or bruits. No hepatomegaly, no splenomegaly EXT:  2 + pulses throughout, no edema, no cyanosis no  clubbing SKIN:  Warm  and dry.  No rashes NEURO:  Alert and oriented x 3. Cranial nerves II through XII intact. PSYCH:  Cognitively intact    ASSESSMENT:    1. Essential hypertension   2. Coronary artery disease due to lipid rich plaque   3. Hypercholesterolemia       PLAN:    In order of problems listed above:  1. Coronary artery disease. S/p multiple stents in the past. Last in 2015.  Myoview in January 2018 was negative for reversible ischemia with normal LVEF.  No angina. Continue current therapy  2. Essential hypertension BP is adequate.   3. Obstructive sleep apnea Unable to tolerate CPAP or dental appliance.  s/p placement of Inspire   4. Type 2 diabetes mellitus with other specified complication, without long-term current use of insulin (HCC) A1c improved to 6.5%.  On metformin and Jardiance. Carb modified diet   5. Hypercholesterolemia. Intolerant of high dose statin in the past. On Zetia, Crestor. LDL 70   Follow up in one  Year.  Medication Adjustments/Labs and Tests Ordered: Current medicines are reviewed at length with the patient today.  Concerns regarding medicines are outlined above.  Orders Placed This Encounter  Procedures   EKG 12-Lead   No orders of the defined types were placed in this encounter.   Signed, Senna Lape [place], MD  09/14/2023 9:55 AM    [facility]

## 2023-09-14 ENCOUNTER — Encounter: Payer: Self-pay | Admitting: Cardiology

## 2023-09-14 ENCOUNTER — Ambulatory Visit: Payer: Medicare Other | Attending: Cardiology | Admitting: Cardiology

## 2023-09-14 VITALS — BP 141/75 | HR 52 | Ht 66.0 in | Wt 164.0 lb

## 2023-09-14 DIAGNOSIS — I10 Essential (primary) hypertension: Secondary | ICD-10-CM

## 2023-09-14 DIAGNOSIS — I2583 Coronary atherosclerosis due to lipid rich plaque: Secondary | ICD-10-CM

## 2023-09-14 DIAGNOSIS — I2510 Atherosclerotic heart disease of native coronary artery without angina pectoris: Secondary | ICD-10-CM | POA: Diagnosis not present

## 2023-09-14 DIAGNOSIS — E7800 Pure hypercholesterolemia, unspecified: Secondary | ICD-10-CM

## 2023-09-14 NOTE — Patient Instructions (Signed)
Medication Instructions:  Continue same medications *If you need a refill on your cardiac medications before your next appointment, please call your pharmacy*   Lab Work: None ordered   Testing/Procedures: None  ordered   Follow-Up: At [facility], you and your health needs are our priority.  As part of our continuing mission to provide you with exceptional heart care, we have created designated Provider Care Teams.  These Care Teams include your primary Cardiologist (physician) and Advanced Practice Providers (APPs -  Physician Assistants and Nurse Practitioners) who all work together to provide you with the care you need, when you need it.  We recommend signing up for the patient portal called "MyChart".  Sign up information is provided on this After Visit Summary.  MyChart is used to connect with patients for Virtual Visits (Telemedicine).  Patients are able to view lab/test results, encounter notes, upcoming appointments, etc.  Non-urgent messages can be sent to your provider as well.   To learn more about what you can do with MyChart, go to [email or web address].    Your next appointment:  1 year     Call in Dec to schedule March appointment    Provider:  Dr.Jordan

## 2023-12-01 ENCOUNTER — Emergency Department (HOSPITAL_COMMUNITY)

## 2023-12-01 ENCOUNTER — Other Ambulatory Visit: Payer: Self-pay

## 2023-12-01 ENCOUNTER — Other Ambulatory Visit: Payer: Self-pay | Admitting: Student

## 2023-12-01 ENCOUNTER — Encounter (HOSPITAL_COMMUNITY): Payer: Self-pay | Admitting: *Deleted

## 2023-12-01 ENCOUNTER — Other Ambulatory Visit: Payer: Self-pay | Admitting: Cardiology

## 2023-12-01 ENCOUNTER — Emergency Department (HOSPITAL_COMMUNITY)
Admission: EM | Admit: 2023-12-01 | Discharge: 2023-12-01 | Disposition: A | Discharge status: Home / Self Care | Attending: Emergency Medicine | Admitting: Emergency Medicine

## 2023-12-01 DIAGNOSIS — Z7982 Long term (current) use of aspirin: Secondary | ICD-10-CM | POA: Diagnosis not present

## 2023-12-01 DIAGNOSIS — Z955 Presence of coronary angioplasty implant and graft: Secondary | ICD-10-CM

## 2023-12-01 DIAGNOSIS — R079 Chest pain, unspecified: Secondary | ICD-10-CM | POA: Insufficient documentation

## 2023-12-01 DIAGNOSIS — I10 Essential (primary) hypertension: Secondary | ICD-10-CM

## 2023-12-01 DIAGNOSIS — E785 Hyperlipidemia, unspecified: Secondary | ICD-10-CM

## 2023-12-01 DIAGNOSIS — R072 Precordial pain: Secondary | ICD-10-CM | POA: Diagnosis not present

## 2023-12-01 DIAGNOSIS — I2510 Atherosclerotic heart disease of native coronary artery without angina pectoris: Secondary | ICD-10-CM | POA: Insufficient documentation

## 2023-12-01 LAB — CBC
HCT: 44 % (ref 39.0–52.0)
Hemoglobin: 15.2 g/dL (ref 13.0–17.0)
MCH: 33.3 pg (ref 26.0–34.0)
MCHC: 34.5 g/dL (ref 30.0–36.0)
MCV: 96.3 fL (ref 80.0–100.0)
Platelets: 183 10*3/uL (ref 150–400)
RBC: 4.57 MIL/uL (ref 4.22–5.81)
RDW: 13.3 % (ref 11.5–15.5)
WBC: 5.3 10*3/uL (ref 4.0–10.5)
nRBC: 0 % (ref 0.0–0.2)

## 2023-12-01 LAB — BASIC METABOLIC PANEL WITH GFR
Anion gap: 9 (ref 5–15)
BUN: 22 mg/dL (ref 8–23)
CO2: 26 mmol/L (ref 22–32)
Calcium: 9.2 mg/dL (ref 8.9–10.3)
Chloride: 102 mmol/L (ref 98–111)
Creatinine, Ser: 1.2 mg/dL (ref 0.61–1.24)
GFR, Estimated: >60 mL/min (ref >60)
Glucose, Bld: 173 mg/dL — ABNORMAL HIGH (ref 70–99)
Potassium: 4 mmol/L (ref 3.5–5.1)
Sodium: 137 mmol/L (ref 135–145)

## 2023-12-01 LAB — TROPONIN I (HIGH SENSITIVITY)
Troponin I (High Sensitivity): 6 ng/L (ref <18)
Troponin I (High Sensitivity): 7 ng/L (ref <18)

## 2023-12-01 MED ORDER — NITROGLYCERIN 0.4 MG SL SUBL: 0.4000 mg | SUBLINGUAL_TABLET | SUBLINGUAL | Status: DC | PRN

## 2023-12-01 MED ORDER — ASPIRIN 81 MG PO CHEW
324.0000 mg | CHEWABLE_TABLET | Freq: Once | ORAL | Status: AC
  Administered 2023-12-01: 324 mg via ORAL
  Filled 2023-12-01: qty 4

## 2023-12-01 NOTE — Consult Note (Signed)
 Cardiology Consultation   Patient ID: NAJAE RATHERT MRN: [id number]; DOB: 1941-02-18  Admit date: 12/01/2023 Date of Consult: 12/01/2023  PCP:  Dyana Glade, MD   [facility] Providers Cardiologist:  Peter Swaziland, MD      Patient Profile: Jeffery Schmidt is a 83 y.o. male with a hx of CAD s/p bare-metal stent to the LCX in 1998, DES to LAD and RCA on 04/2014, chronic grade 1 diastolic dysfunction, hypertension, hyperlipidemia, OSA s/p hypoglossal nerve stimulator (inspire), type 2 diabetes who is being seen 12/01/2023 for the evaluation of chest pain at the request of Jerilynn Montenegro, MD.  History of Present Illness: Jeffery Schmidt is an 83 year old male who received a bare-metal stent to the LCX in 1998, on 04/2014 he received DES x2 to LAD and RCA.  A transthoracic echo was performed in 2015 and showed an LVEF of 60 to 65%, mild concentric basal and septal hypertrophy, no regional wall motion abnormalities, mild MR, and mild left atrium dilation. in 2018 the patient had recurrent chest pain and received a Lexiscan  Myoview  stress test that showed no signs of ischemia and a normal EF. Patient was last seen by Dr. Swaziland on 08/2023.  At that time denied any anginal symptoms and blood pressure, cholesterol, diabetes appeared to be well-managed.   Patient presented to the emergency department on 12/01/2023 for chest pain.  On interview the patient reported that the chest pain was left-sided started this morning around 5 AM.  Pain was 4 out of 10. It would last about 10 seconds and recur about every 2 minutes.  The pain was not associated with exertion or inspiration and did not radiate.  The pain went away when he presented to the ER at about 8 AM and has not reoccurred since.  Stated that this chest pain was slightly different from the pain he had prior to receiving stents in 1998 and 2015.  He is concerned about this pain. Denies shortness of breath, orthopnea, PND, nausea, vomiting, and  diaphoresis. 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  The patient has not noticed any recent exertional limitations or changes.  Denies tobacco use, alcohol use, cannabis use, licit substance use. Blood pressures have generally been slightly elevated most recent BP 137/71.   Labs in the emergency department showed Metabolic panel was unremarkable except for hyperglycemia of 173.  Creatinine of 1.2. Normal hemoglobin of 15.2. Chest x-ray showed no active cardiopulmonary disease. EKG showed normal sinus rhythm with a rate of 60, First degree AV block of , an acute ST/T changes.  EKG is similar to prior EKG on 08/2023. high-sensitivity troponin 6>7  Past Medical History:  Diagnosis Date   CAD (coronary artery disease) cardiologist---dr Swaziland   a. 1998 s/p BMS to LCX;  b. 12/2012 MV: EF 68%, no ischemia;  c. 04/2014 Cath/PCI: LM nl, LAD 90p (3.0x18 Biotronik Orsiro DES - 3.5), LCX 24m, patent stent, RCA  90 (2.5x18 Biotronik Orsiro DES - 2.75);   nuclear stress test 07-02-2016 -- low risk with no evidence reversible ischemia, nuclear ef 60%   Complication of anesthesia    anesthesia delirium after heart cath (hx ptsd)   Diastolic CHF, chronic (HCC)    followed by dr Swaziland--- last echo 05-06-2014 in epic, ef 60-65%, G1DD, mild MR, mild LAE   First degree heart block    Frequency  of urination    History of kidney stones    HLD (hyperlipidemia)    HTN (hypertension)    Left ureteral calculus    Nocturia    OA (osteoarthritis)    Obstructive sleep apnea followed by dr dohmeier   study 11-18-2014 in epic, moderate osa on cpap;  pt intolerant to cpap, pt is s/p hypoglossal nerver stimulator implantation (Inspire) on 06-12-2018 (pt re-titated 02-27-2019)  PT HAS A CONTROL TO DEVICE   PTSD (post-traumatic stress disorder)    S/p bare metal coronary artery stent 1998   to LCx   S/P  drug eluting coronary stent placement 05/07/2014   to pLAD and mRCA   Type 2 diabetes mellitus (HCC)    followed by pcp--- (10-10-2019 checks blood sugar 3 times per week in am,  fasting sugar-- 89-95   Wears glasses    Wears hearing aid in both ears     Past Surgical History:  Procedure Laterality Date   BLEPHAROPLASTY Bilateral 2000's   upper   COLONOSCOPY  last one 04-21-2017 in epic   CORONARY ANGIOPLASTY WITH STENT PLACEMENT  1998   PTCA w/ BMS to LCx   CORONARY ANGIOPLASTY WITH STENT PLACEMENT  05/07/14   PCI w/ DES  to proximal LAD and mid RCA, patent LCX stent    CYSTOSCOPY WITH RETROGRADE PYELOGRAM, URETEROSCOPY AND STENT PLACEMENT Left 10/12/2019   Procedure: CYSTOSCOPY WITH RETROGRADE PYELOGRAM, URETEROSCOPY AND STENT PLACEMENT;  Surgeon: Osborn Blaze, MD;  Location: [facility];  Service: Urology;  Laterality: Left;   DRUG INDUCED ENDOSCOPY N/A 02/22/2018   Procedure: DRUG INDUCED ENDOSCOPY;  Surgeon: Virgina Grills, MD;  Location: [facility];  Service: ENT;  Laterality: N/A;   EXTRACORPOREAL SHOCK WAVE LITHOTRIPSY  1990s   HOLMIUM LASER APPLICATION Left 10/12/2019   Procedure: HOLMIUM LASER APPLICATION;  Surgeon: Osborn Blaze, MD;  Location: [facility];  Service: Urology;  Laterality: Left;   LEFT HEART CATHETERIZATION WITH CORONARY ANGIOGRAM N/A 05/07/2014   Procedure: LEFT HEART CATHETERIZATION WITH CORONARY ANGIOGRAM;  Surgeon: Peter M Swaziland, MD;  Location: [facility] CATH LAB;  Service: Cardiovascular;  Laterality: N/A;   PERCUTANEOUS CORONARY STENT INTERVENTION (PCI-S)  05/07/2014   Procedure: PERCUTANEOUS CORONARY STENT INTERVENTION (PCI-S);  Surgeon: Peter M Swaziland, MD;  Location: [facility] CATH LAB;  Service: Cardiovascular;;  Drug eluting stent Prox LAD (3.0/18 Orsiro) and drug eluting stent to Mid RCA (2.5/18 Orsiro)     Home Medications:  Prior to Admission medications   Medication Sig Start Date End Date Taking? Authorizing  Provider  amLODipine  (NORVASC ) 10 MG tablet Take 10 mg by mouth daily.     [provider]  aspirin  81 MG tablet Take 81 mg by mouth daily.    [provider]  empagliflozin (JARDIANCE) 10 MG TABS tablet Take 10 mg by mouth daily.    [provider]  ezetimibe  (ZETIA ) 10 MG tablet Take 10 mg by mouth daily.     [provider]  hydrochlorothiazide  (HYDRODIURIL ) 25 MG tablet TAKE ONE-HALF TABLET BY MOUTH DAILY FOR BLOOD PRESSURE 08/25/22   [provider]  losartan  (COZAAR ) 100 MG tablet Take 100 mg by mouth daily.     [provider]  Melatonin 3 MG CAPS Take 3 mg by mouth at bedtime.    [provider]  metFORMIN  (GLUCOPHAGE ) 500 MG tablet Take 500 mg by mouth daily with breakfast.     [provider]  metoprolol  succinate (TOPROL -XL) 100 MG 24 hr  tablet Take 100 mg by mouth daily.  03/17/17   [provider]  Multiple Vitamin tablet Take 1 tablet by mouth daily.    [provider]  rosuvastatin (CRESTOR) 10 MG tablet TAKE ONE-HALF TABLET BY MOUTH ONCE A DAY FOR CHOLESTEROL 07/23/21   [provider]    Scheduled Meds:  Continuous Infusions:  PRN Meds: nitroGLYCERIN   Allergies:    Allergies  Allergen Reactions   Brilinta  [Ticagrelor ] Shortness Of Breath   Epinephrine  Anaphylaxis and Other (See Comments)    Pt states he is anaphyactic    Social History:   Social History   Socioeconomic History   Marital status: Married    Spouse name: Leary Provencal   Number of children: 6   Years of education: Not on file   Highest education level: Not on file  Occupational History   Occupation: retired  Tobacco Use   Smoking status: Never   Smokeless tobacco: Never  Vaping Use   Vaping status: Never Used  Substance and Sexual Activity   Alcohol use: No   Drug use: Never   Sexual activity: Yes    Partners: Female    Comment: MARRIED  Other Topics Concern   Not on file  Social History Narrative    Not on file   Social Drivers of Health   Financial Resource Strain: Low Risk  (07/06/2023)   Received from [facility]   Overall Financial Resource Strain (CARDIA)    Difficulty of Paying Living Expenses: Not hard at all  Food Insecurity: No Food Insecurity (07/06/2023)   Received from [facility]   Hunger Vital Sign    Worried About Running Out of Food in the Last Year: Never true    Ran Out of Food in the Last Year: Never true  Transportation Needs: No Transportation Needs (07/06/2023)   Received from [facility] - Transportation    Lack of Transportation (Medical): No    Lack of Transportation (Non-Medical): No  Physical Activity: Sufficiently Active (06/07/2023)   [facility]   Exercise Vital Sign    Days of Exercise per Week: 7 days    Minutes of Exercise per Session: 150+ min  Stress: No Stress Concern Present (06/07/2023)   Received from [facility] - Occupational Stress Questionnaire    Feeling of Stress : Not at all  Social Connections: Socially Integrated (06/07/2023)   Received from [facility]   Social Network    How would you rate your social network (family, work, friends)?: Good participation with social networks  Intimate Partner Violence: Not At Risk (06/07/2023)   Received from [facility]   HITS    Over the last 12 months how often did your partner physically hurt you?: Never    Over the last 12 months how often did your partner insult you or talk down to you?: Never    Over the last 12 months how often did your partner threaten you with physical harm?: Never    Over the last 12 months how often did your partner scream or curse at you?: Never    Family History:    Family History  Problem Relation Age of Onset   Hypertension Mother    Coronary artery disease Other    Heart disease Other    Hypertension Other    Stroke Other    Other Father        unknown     ROS:  Please see the history of present illness.   All other ROS reviewed and negative.     Physical Exam/Data: Vitals:   12/01/23 0834 12/01/23 0836 12/01/23 0945 12/01/23 1100  BP:  (!) 160/67 138/80 137/71  Pulse:  66 (!) 57 (!) 59  Resp:  17 15 10   Temp:  98 F (36.7 C)    TempSrc:  Oral    SpO2:  99% 95% 96%  Weight: 74.2 kg     Height: 5\' 6"  (1.676 m)      No intake or output data in the 24 hours ending 12/01/23 1123    12/01/2023    8:34 AM 09/14/2023    9:29 AM 03/08/2023    2:37 PM  Last 3 Weights  Weight (lbs) 163 lb 8 oz 164 lb 161 lb  Weight (kg) 74.163 kg 74.39 kg 73.029 kg     Body mass index is 26.39 kg/m.  General:  Well nourished, well developed, appearing younger than stated age.  On room air during visit.  Alert and orientated. HEENT: normal Neck: no JVD Vascular: No carotid bruits; Distal pulses 2+ bilaterally Cardiac:  normal S1, S2; RRR; no murmur  Lungs:  clear to auscultation bilaterally, no wheezing, rhonchi or rales  Abd: soft, nontender, no hepatomegaly  Ext: no edema Musculoskeletal:  No deformities. Skin: warm and dry  Neuro:   no focal abnormalities noted Psych:  Normal affect   EKG:  The EKG was personally reviewed and demonstrates:  Normal sinus rhythm with a rate of 60, First degree AV block of , an acute ST/T changes.   Telemetry:  Telemetry was personally reviewed and demonstrates: Normal sinus rhythm with resting rates in the 50s, first degree AV block, few PVCs.  Relevant CV Studies:   Laboratory Data: High Sensitivity Troponin:   Recent Labs  Lab 12/01/23 0823  TROPONINIHS 6     Chemistry Recent Labs  Lab 12/01/23 0823  NA 137  K 4.0  CL 102  CO2 26  GLUCOSE 173*  BUN 22  CREATININE 1.20  CALCIUM  9.2  GFRNONAA >60  ANIONGAP 9    No results for input(s): "PROT", "ALBUMIN", "AST", "ALT", "ALKPHOS", "BILITOT" in the last 168 hours. Lipids No results for input(s): "CHOL", "TRIG", "HDL", "LABVLDL", "LDLCALC",  "CHOLHDL" in the last 168 hours.  Hematology Recent Labs  Lab 12/01/23 0823  WBC 5.3  RBC 4.57  HGB 15.2  HCT 44.0  MCV 96.3  MCH 33.3  MCHC 34.5  RDW 13.3  PLT 183   Thyroid No results for input(s): "TSH", "FREET4" in the last 168 hours.  BNPNo results for input(s): "BNP", "PROBNP" in the last 168 hours.  DDimer No results for input(s): "DDIMER" in the last 168 hours.  Radiology/Studies:  DG Chest 2 View Result Date: 12/01/2023 CLINICAL DATA:  Chest pain EXAM: CHEST - 2 VIEW COMPARISON:  June 12, 2018 FINDINGS: The heart size and mediastinal contours are within normal limits. Both lungs are clear. The visualized skeletal structures are unremarkable. Stimulator device projects of the right hemithorax. Unchanged since prior. IMPRESSION: No active cardiopulmonary disease. Electronically Signed   By: Fredrich Jefferson M.D.   On: 12/01/2023 08:41     Assessment and Plan: Jeffery Schmidt is a 83 y.o. male with a hx of CAD s/p bare-metal stent to the LCX in 1998, DES to LAD and RCA on 04/2014, chronic grade 1 diastolic dysfunction, hypertension, hyperlipidemia, OSA s/p hypoglossal nerve stimulator (inspire), type 2 diabetes who is being seen  12/01/2023 for the evaluation of chest pain at the request of Faythe Hopes, MD.  Chest pain CAD s/p BMS to LCX in 1998, DES to LAD and RCA on 04/2014. Hyperlipidemia Presented to the emergency department for 4 out of 10 left-sided chest pain that started at 5 AM.  Pain was intermittent lasting 10 seconds and reoccurring every 2 minutes.  Pain resolved upon arriving to the ER.  Denies any shortness of breath, nausea, vomiting, diaphoresis.  Pain is not exertional or pleuritic. Patient felt that this chest pain was slightly different from the pain he had in the past when he received stents. Denies tobacco use, alcohol use, cannabis use, licit substance use.  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  Able to do more than 4 metabolic equivalents of  exertion.  No recent changes to functional status. - With chest pain that is atypical for ACS and normal troponins no further inpatient ischemic evaluation is recommended at this time. - EKG showed no acute ST or T wave changes, High-sensitivity troponin negative 6 > 7. - Plan to schedule outpatient follow-up with cardiology - Plan for outpatient nuclear stress test. - Continue home aspirin , Toprol  XL, Crestor, and Zetia    Hypertension Patient reported taking all of his medications this morning.  Blood pressures in the emergency department have been slightly elevated.  Most recent BP 137/71. - Continue home amlodipine , Toprol  XL, hydrochlorothiazide , and losartan . - May consider outpatient evaluation for secondary hypertension   OSA - s/p hypoglossal nerve stimulator (inspire).  Is not a candidate for CPAP.   Type 2 diabetes - Continue home Jardiance and metformin .   Risk Assessment/Risk Scores:           [facility] will sign off.   The patient is ready for discharge today from a cardiac standpoint. Medication Recommendations:  none Other recommendations (labs, testing, etc):  Outpatient stress test Follow up as an outpatient:  Yes  For questions or updates, please contact [facility] Please consult [email or web address] for contact info under    Signed, Melita Springer, PA-C  12/01/2023 11:23 AM

## 2023-12-01 NOTE — ED Triage Notes (Signed)
 Pt is here for chest pain in left chest which began at 5am and feels like a "throb" and is intermittent.  No sob or nausea or diaphoresis.

## 2023-12-01 NOTE — ED Provider Notes (Signed)
 [place] EMERGENCY DEPARTMENT AT [facility] Provider Note   CSN: [id number] Arrival date & time: 12/01/23  4098     History  Chief Complaint  Patient presents with   Chest Pain    Jeffery Schmidt is a 83 y.o. male.  HPI 83 year old male with a history of CAD with stents presents with chest pain.  Woke him up around 5 AM.  The pain is similar to what he experienced 10 years ago when he got a stent.  The patient states that the pain comes and goes, is brief and dull in sensation in the left chest.  However it will frequently come and go every couple minutes.  Nothing specific makes it worse such as exertion.  Now that he has arrived at the ER he states that the pain seems to have gone away.  Did not have any diaphoresis, shortness of breath, radiation of the pain including no radiation to his arms, neck, abdomen, back.  No new leg swelling.  Home Medications Prior to Admission medications   Medication Sig Start Date End Date Taking? Authorizing Provider  amLODipine  (NORVASC ) 10 MG tablet Take 10 mg by mouth daily.    Yes [provider]  aspirin  81 MG chewable tablet Chew 81 mg by mouth daily.   Yes [provider]  augmented betamethasone dipropionate (DIPROLENE-AF) 0.05 % cream Apply 1 Application topically daily. 11/07/23  Yes [provider]  empagliflozin (JARDIANCE) 25 MG TABS tablet Take 12.5 mg by mouth daily. 09/01/23  Yes [provider]  ezetimibe  (ZETIA ) 10 MG tablet Take 10 mg by mouth daily.    Yes [provider]  hydrochlorothiazide  (MICROZIDE ) 12.5 MG capsule Take 12.5 mg by mouth daily.   Yes [provider]  losartan  (COZAAR ) 100 MG tablet Take 100 mg by mouth daily.    Yes [provider]  Melatonin 3 MG CAPS Take 3 mg by mouth at bedtime.   Yes [provider]  metFORMIN  (GLUCOPHAGE ) 500 MG tablet Take 500 mg by mouth daily with breakfast.    Yes [provider]  metoprolol   succinate (TOPROL -XL) 100 MG 24 hr tablet Take 100 mg by mouth daily.  03/17/17  Yes [provider]  rosuvastatin (CRESTOR) 10 MG tablet TAKE ONE-HALF TABLET BY MOUTH ONCE A DAY FOR CHOLESTEROL 07/23/21  Yes [provider]  halobetasol (ULTRAVATE) 0.05 % ointment Apply topically 2 (two) times daily. Patient not taking: Reported on 12/01/2023    [provider]  Multiple Vitamin tablet Take 1 tablet by mouth daily. Patient not taking: Reported on 12/01/2023    [provider]  tiZANidine (ZANAFLEX) 4 MG tablet Take 4 mg by mouth at bedtime. Patient not taking: Reported on 12/01/2023 11/03/23 01/02/24  [provider]      Allergies    Brilinta  [ticagrelor ] and Epinephrine     Review of Systems   Review of Systems  Constitutional:  Negative for diaphoresis.  Respiratory:  Negative for shortness of breath.   Cardiovascular:  Positive for chest pain.  Gastrointestinal:  Negative for abdominal pain.  Musculoskeletal:  Negative for back pain.    Physical Exam Updated Vital Signs BP 128/81   Pulse (!) 55   Temp 98 F (36.7 C) (Oral)   Resp 13   Ht 5\' 6"  (1.676 m)   Wt 74.2 kg   SpO2 100%   BMI 26.39 kg/m  Physical Exam Vitals and nursing note reviewed.  Constitutional:  General: He is not in acute distress.    Appearance: He is well-developed. He is not ill-appearing or diaphoretic.  HENT:     Head: Normocephalic and atraumatic.  Cardiovascular:     Rate and Rhythm: Normal rate and regular rhythm.     Pulses:          Radial pulses are 2+ on the right side and 2+ on the left side.     Heart sounds: Normal heart sounds.  Pulmonary:     Effort: Pulmonary effort is normal.     Breath sounds: Normal breath sounds.  Abdominal:     Palpations: Abdomen is soft.     Tenderness: There is no abdominal tenderness.  Skin:    General: Skin is warm and dry.  Neurological:     Mental Status: He is alert.     ED Results / Procedures /  Treatments   Labs (all labs ordered are listed, but only abnormal results are displayed) Labs Reviewed  BASIC METABOLIC PANEL WITH GFR - Abnormal; Notable for the following components:      Result Value   Glucose, Bld 173 (*)    All other components within normal limits  CBC  TROPONIN I (HIGH SENSITIVITY)  TROPONIN I (HIGH SENSITIVITY)    EKG EKG Interpretation Date/Time:  Thursday December 01 2023 08:14:33 EDT Ventricular Rate:  60 PR Interval:  306 QRS Duration:  84 QT Interval:  402 QTC Calculation: 402 R Axis:   17  Text Interpretation: Sinus rhythm with 1st degree A-V block no acute ST/T changes overall similar to Mar 2025 Confirmed by Jerilynn Montenegro [phone]) on 12/01/2023 8:33:29 AM  Radiology DG Chest 2 View Result Date: 12/01/2023 CLINICAL DATA:  Chest pain EXAM: CHEST - 2 VIEW COMPARISON:  June 12, 2018 FINDINGS: The heart size and mediastinal contours are within normal limits. Both lungs are clear. The visualized skeletal structures are unremarkable. Stimulator device projects of the right hemithorax. Unchanged since prior. IMPRESSION: No active cardiopulmonary disease. Electronically Signed   By: Fredrich Jefferson M.D.   On: 12/01/2023 08:41    Procedures Procedures    Medications Ordered in ED Medications  nitroGLYCERIN  (NITROSTAT ) SL tablet 0.4 mg (has no administration in time range)  aspirin  chewable tablet 324 mg (324 mg Oral Given 12/01/23 1914)    ED Course/ Medical Decision Making/ A&P                                 Medical Decision Making Amount and/or Complexity of Data Reviewed External Data Reviewed: notes.    Details: Most recent cardiology outpatient note Labs: ordered.    Details: Troponins negative x 2 Radiology: ordered and independent interpretation performed.    Details: No CHF ECG/medicine tests: independent interpretation performed.    Details: No ischemia  Risk OTC drugs. Prescription drug management.   Patient presents with  atypical chest pain but similar to what he experienced 10+ years ago.  Troponins are negative.  Patient has not had any pain since arriving here.  Cardiology was consulted and they have evaluated patient and feel he is stable for outpatient stress and follow-up in the office.  Doubt dissection, PE.  Will discharge home with return precautions.        Final Clinical Impression(s) / ED Diagnoses Final diagnoses:  Nonspecific chest pain    Rx / DC Orders ED Discharge Orders  Ordered    Ambulatory referral to Cardiology       Comments: If you have not heard from the Cardiology office within the next 72 hours please call [phone].   12/01/23 1439              Jerilynn Montenegro, MD 12/01/23 1521

## 2023-12-01 NOTE — ED Notes (Signed)
 Awaiting patient from lobby.

## 2023-12-01 NOTE — Discharge Instructions (Signed)
 The cardiology team will set up an outpatient appointment and stress test for you.  If you develop recurrent, continued, or worsening chest pain, shortness of breath, fever, vomiting, abdominal or back pain, or any other new/concerning symptoms then return to the ER for evaluation.

## 2023-12-02 ENCOUNTER — Other Ambulatory Visit: Payer: Self-pay | Admitting: Cardiology

## 2023-12-02 ENCOUNTER — Telehealth (HOSPITAL_COMMUNITY): Payer: Self-pay | Admitting: *Deleted

## 2023-12-02 DIAGNOSIS — R079 Chest pain, unspecified: Secondary | ICD-10-CM

## 2023-12-02 DIAGNOSIS — R072 Precordial pain: Secondary | ICD-10-CM

## 2023-12-02 NOTE — Telephone Encounter (Signed)
 Pt given instructions for MPI study.

## 2023-12-08 ENCOUNTER — Ambulatory Visit (HOSPITAL_COMMUNITY)
Admission: RE | Admit: 2023-12-08 | Discharge: 2023-12-08 | Disposition: A | Discharge status: Home / Self Care | Source: Ambulatory Visit | Attending: Cardiology | Admitting: Cardiology

## 2023-12-08 DIAGNOSIS — R072 Precordial pain: Secondary | ICD-10-CM | POA: Insufficient documentation

## 2023-12-08 DIAGNOSIS — R079 Chest pain, unspecified: Secondary | ICD-10-CM | POA: Insufficient documentation

## 2023-12-08 LAB — MYOCARDIAL PERFUSION IMAGING
LV dias vol: 72 mL (ref 62–150)
LV sys vol: 26 mL (ref 4.2–5.8)
Nuc Stress EF: 64 %
Peak HR: 73 {beats}/min
Rest HR: 55 {beats}/min
Rest Nuclear Isotope Dose: 9.8 mCi
SDS: 3
SRS: 0
SSS: 3
ST Depression (mm): 0 mm
Stress Nuclear Isotope Dose: 31 mCi
TID: 1.02

## 2023-12-08 MED ORDER — TECHNETIUM TC 99M TETROFOSMIN IV KIT
31.0000 | PACK | Freq: Once | INTRAVENOUS | Status: AC | PRN
  Administered 2023-12-08: 31 via INTRAVENOUS

## 2023-12-08 MED ORDER — REGADENOSON 0.4 MG/5ML IV SOLN
0.4000 mg | Freq: Once | INTRAVENOUS | Status: AC
  Administered 2023-12-08: 0.4 mg via INTRAVENOUS

## 2023-12-08 MED ORDER — REGADENOSON 0.4 MG/5ML IV SOLN
INTRAVENOUS | Status: AC
  Filled 2023-12-08: qty 5

## 2023-12-08 MED ORDER — TECHNETIUM TC 99M TETROFOSMIN IV KIT
9.8000 | PACK | Freq: Once | INTRAVENOUS | Status: AC | PRN
  Administered 2023-12-08: 9.8 via INTRAVENOUS

## 2023-12-09 ENCOUNTER — Ambulatory Visit: Payer: Self-pay | Admitting: Cardiology

## 2023-12-22 ENCOUNTER — Ambulatory Visit: Payer: Self-pay | Admitting: Cardiology

## 2024-01-02 ENCOUNTER — Ambulatory Visit: Admitting: Physician Assistant

## 2024-03-07 ENCOUNTER — Ambulatory Visit: Payer: Medicare Other | Admitting: Neurology

## 2024-03-07 ENCOUNTER — Ambulatory Visit: Admitting: Neurology

## 2024-04-10 ENCOUNTER — Encounter: Payer: Self-pay | Admitting: Neurology

## 2024-04-10 ENCOUNTER — Ambulatory Visit: Admitting: Neurology

## 2024-04-10 ENCOUNTER — Telehealth: Payer: Self-pay | Admitting: Neurology

## 2024-04-10 VITALS — BP 160/69 | HR 55 | Ht 66.0 in | Wt 165.6 lb

## 2024-04-10 DIAGNOSIS — G4734 Idiopathic sleep related nonobstructive alveolar hypoventilation: Secondary | ICD-10-CM

## 2024-04-10 DIAGNOSIS — I2510 Atherosclerotic heart disease of native coronary artery without angina pectoris: Secondary | ICD-10-CM | POA: Diagnosis not present

## 2024-04-10 DIAGNOSIS — Z9682 Presence of neurostimulator: Secondary | ICD-10-CM

## 2024-04-10 DIAGNOSIS — Z789 Other specified health status: Secondary | ICD-10-CM

## 2024-04-10 NOTE — Progress Notes (Addendum)
 Provider:  Dedra Gores, MD  Primary Care Physician:  Reita Locus, MD [address] [place] [address]     Referring Provider: Reita Locus, Md [address] [place],  [place] [address]          Chief Complaint according to patient   Patient presents with:                HISTORY OF PRESENT ILLNESS:  Jeffery Schmidt is a 83 y.o. male patient who is here for revisit on 04/10/2024 for  Inspire.  .    Chief concern according to patient :  osteoarthritic DDD, and cervical pain, ROM limitation- he is in treatment at [facility] spine surgeons and his surgeon was apprehensive about the Inspire device and treatment options; BMI unchanged for 5 years.       Jeffery Schmidt is a 83 y.o. male patient who is here for a new visit after a 3 year hiatus on  03/08/2023.  Chief concern according to patient :  patient here after a 3 year hiatus for inspire re investigation. Uses it nightly, he stated but download shows 2 nights out 21 skipped. 90% compliance is still excellent! Current setting  2.8 V ,  pulse width of 90 microseconds. Frequency 33 hz,  20 minutes start delay and 8 h therapy duration.   Wave form obtained.  Attached.     GDS 1/ 15 points. Epworth sleepiness score: 5/ 24. FSS at 9/ 63 points. No side effects or tolerance issues.    19-Apr-2020-   Interval history : Jeffery Schmidt is a 83 y.o. male patient  , whom I had met before upon referral from ENT specialist Dr Carlie, MD. patient was here overnight for the titration of Inspire- has not reached full potential. I have the pleasure of meeting [name] today who has by now seen us  for about 18 months he was titrated on 01/20/2020 in the office setting an overnight sleep lab stay to a setting up to 2.8 V.  The patient has experienced a reduced apnea index on the each setting the ultimate goal is now defined to be a setting of 2.8 or maybe 2.9 V.  He tolerated the higher settings without difficulty at  night and he was told that he can at home slowly change and advance the settings.  His AHI for this inspire titration was 7.6 overall but his AHI at the 2.8 multilevel was 5/h which is a fine resolution of apnea.  He does have Cheyne-Stokes breathing of form of central apnea but is not responsive to the inspire device but his obstructive apneas have all resolved.   [name] has been very happy with the inspire therapy, he no longer falls asleep at red lights he noted.  His Epworth sleepiness score was today endorsed at 2 out of 24 points very great reduction his fatigue severity scale used to be in the 50s and now is 9 points.  This is the lowest achievable.  So overall he has clear clinical benefit, his obstructive sleep apnea is resolved, he is planning to get the Covid booster shot and may be combining it with the flu shot .  Dedra Gores, MD  04-19-20.      PS : His daughter died of Covid 5 he learnt , he was infected, too. Tested COVID Ab Positive.  HPI:  Jeffery Schmidt is a 83 y.o. male patient  , whom  I had met before upon referral from ENT specialist Dr Carlie, MD.  [name] reported that he had been previously multiple times assessed for sleep apnea he started using CPAP 5 years ago but could not tolerate positive airway pressure therapy.  His sleep was too interrupted he did not feel restored or refreshed and therefore discontinued the use.  He also attempted to improve with a dental device which also has failed.  In December I saw him briefly in the sleep lab outside the regular sleep clinic appointments and he had just been implanted with an inspire device.  The device was turned on in January 2020.  The patient reports tolerating the device very well his only concern was that he had already wore out the batteries of the remote control.     Sleep and medical history; see above. Dr. Peter Swaziland is his cardiologist; Dr. Vaughan Carlie his ENT physician.   He has a longstanding  history of known coronary artery disease diagnosed first in 1998 according to my records.  In 2014 he had an ejection fraction of 68 no signs of ischemia in November 2015 he had a cardiac catheterization and percutaneous stent placement.  LAD and left circumflex were stented right coronary artery, with a Biotronik posterior device.  The patient carries a diagnosis of mild mitral valve regurgitation and a mildly dilated  left atrium.  After the heart catheterization he had a brief delirium.  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   I reviewed the patient's current medications in detail. OR note reviewed. Cardiac visit note reviewed.  Family sleep history: mother with HTN, CAD and CVA    Social history:  Very remote smoking history, never more than 5 a day. Caffeine occasionally, ETOH : none.   Sleep habits are as follows: Bedtime is between 10-11 Pm and going to sleep is not a problem. With the inspire device he has been snoring less and sleeping deeper, feels refreshed and restored, averaging 7 hours of sleep with fewer nocturia   Patient was seen today for an inspire follow up. No changes were made. Inspire rep Cheron was here for today visit.                    Review of Systems: Out of a complete 14 system review, the patient complains of only the following symptoms, and all other reviewed systems are negative.:   SLEEPINESS ?  How likely are you to doze in the following situations: 0 = not likely, 1 = slight chance, 2 = moderate chance, 3 = high chance  Sitting and Reading? Watching Television? Sitting inactive in a public place (theater or meeting)? Lying down in the afternoon when circumstances permit? Sitting and talking to someone? Sitting quietly after lunch without alcohol? In a car, while stopped for a few minutes in traffic? As a  passenger in a car for an hour without a break?  Total =  8/ 24        Social History   Socioeconomic History   Marital status: Married    Spouse name: Clarita   Number of children: 6   Years of education: Not on file   Highest education level: Not on file  Occupational History   Occupation: retired  Tobacco Use   Smoking status: Never   Smokeless tobacco: Never  Vaping Use   Vaping status: Never Used  Substance  and Sexual Activity   Alcohol use: No   Drug use: Never   Sexual activity: Yes    Partners: Female    Comment: MARRIED  Other Topics Concern   Not on file  Social History Narrative   Not on file   Social Drivers of Health   Financial Resource Strain: Low Risk  (07/06/2023)   Received from [facility]   Overall Financial Resource Strain (CARDIA)    Difficulty of Paying Living Expenses: Not hard at all  Food Insecurity: No Food Insecurity (07/06/2023)   Received from [facility]   Hunger Vital Sign    Within the past 12 months, you worried that your food would run out before you got the money to buy more.: Never true    Within the past 12 months, the food you bought just didn't last and you didn't have money to get more.: Never true  Transportation Needs: No Transportation Needs (07/06/2023)   Received from [facility] - Transportation    Lack of Transportation (Medical): No    Lack of Transportation (Non-Medical): No  Physical Activity: Sufficiently Active (06/07/2023)   Received from [facility]   Exercise Vital Sign    On average, how many days per week do you engage in moderate to strenuous exercise (like a brisk walk)?: 7 days    On average, how many minutes do you engage in exercise at this level?: 150+ min  Stress: No Stress Concern Present (06/07/2023)   Received from [facility] of Occupational [facility] - Occupational Stress Questionnaire    Feeling of Stress : Not at all  Social Connections: Socially Integrated  (06/07/2023)   Received from [facility]   Social Network    How would you rate your social network (family, work, friends)?: Good participation with social networks    Family History  Problem Relation Age of Onset   Hypertension Mother    Coronary artery disease Other    Heart disease Other    Hypertension Other    Stroke Other    Other Father        unknown    Past Medical History:  Diagnosis Date   CAD (coronary artery disease) cardiologist---dr swaziland   a. 1998 s/p BMS to LCX;  b. 12/2012 MV: EF 68%, no ischemia;  c. 04/2014 Cath/PCI: LM nl, LAD 90p (3.0x18 Biotronik Orsiro DES - 3.5), LCX 54m, patent stent, RCA  90 (2.5x18 Biotronik Orsiro DES - 2.75);   nuclear stress test 07-02-2016 -- low risk with no evidence reversible ischemia, nuclear ef 60%   Complication of anesthesia    anesthesia delirium after heart cath (hx ptsd)   Diastolic CHF, chronic (HCC)    followed by dr swaziland--- last echo 05-06-2014 in epic, ef 60-65%, G1DD, mild MR, mild LAE   First degree heart block    Frequency of urination    History of kidney stones    HLD (hyperlipidemia)    HTN (hypertension)    Left ureteral calculus    Nocturia    OA (osteoarthritis)    Obstructive sleep apnea followed by dr Kamiryn Bezanson   study 11-18-2014 in epic, moderate osa on cpap;  pt intolerant to cpap, pt is s/p hypoglossal nerver stimulator implantation (Inspire) on 06-12-2018 (pt re-titated 02-27-2019)  PT HAS A CONTROL TO DEVICE   PTSD (post-traumatic stress disorder)    S/p bare metal coronary artery stent 1998   to LCx   S/P drug eluting coronary  stent placement 05/07/2014   to pLAD and mRCA   Type 2 diabetes mellitus (HCC)    followed by pcp--- (10-10-2019 checks blood sugar 3 times per week in am,  fasting sugar-- 89-95   Wears glasses    Wears hearing aid in both ears     Past Surgical History:  Procedure Laterality Date   BLEPHAROPLASTY Bilateral 2000's   upper   COLONOSCOPY  last one 04-21-2017 in  epic   CORONARY ANGIOPLASTY WITH STENT PLACEMENT  1998   PTCA w/ BMS to LCx   CORONARY ANGIOPLASTY WITH STENT PLACEMENT  05/07/14   PCI w/ DES  to proximal LAD and mid RCA, patent LCX stent    CYSTOSCOPY WITH RETROGRADE PYELOGRAM, URETEROSCOPY AND STENT PLACEMENT Left 10/12/2019   Procedure: CYSTOSCOPY WITH RETROGRADE PYELOGRAM, URETEROSCOPY AND STENT PLACEMENT;  Surgeon: Alvaro Hummer, MD;  Location: [facility];  Service: Urology;  Laterality: Left;   DRUG INDUCED ENDOSCOPY N/A 02/22/2018   Procedure: DRUG INDUCED ENDOSCOPY;  Surgeon: Carlie Clark, MD;  Location: [facility];  Service: ENT;  Laterality: N/A;   EXTRACORPOREAL SHOCK WAVE LITHOTRIPSY  1990s   HOLMIUM LASER APPLICATION Left 10/12/2019   Procedure: HOLMIUM LASER APPLICATION;  Surgeon: Alvaro Hummer, MD;  Location: [facility];  Service: Urology;  Laterality: Left;   LEFT HEART CATHETERIZATION WITH CORONARY ANGIOGRAM N/A 05/07/2014   Procedure: LEFT HEART CATHETERIZATION WITH CORONARY ANGIOGRAM;  Surgeon: Peter M Swaziland, MD;  Location: [facility] CATH LAB;  Service: Cardiovascular;  Laterality: N/A;   PERCUTANEOUS CORONARY STENT INTERVENTION (PCI-S)  05/07/2014   Procedure: PERCUTANEOUS CORONARY STENT INTERVENTION (PCI-S);  Surgeon: Peter M Swaziland, MD;  Location: [facility] CATH LAB;  Service: Cardiovascular;;  Drug eluting stent Prox LAD (3.0/18 Orsiro) and drug eluting stent to Mid RCA (2.5/18 Orsiro)     Current Outpatient Medications on File Prior to Visit  Medication Sig Dispense Refill   amLODipine  (NORVASC ) 10 MG tablet Take 10 mg by mouth daily.      aspirin  81 MG chewable tablet Chew 81 mg by mouth daily.     augmented betamethasone dipropionate (DIPROLENE-AF) 0.05 % cream Apply 1 Application topically daily.     empagliflozin (JARDIANCE) 25 MG TABS tablet Take 12.5 mg by mouth daily.     ezetimibe  (ZETIA ) 10 MG tablet Take 10 mg by mouth daily.      halobetasol (ULTRAVATE) 0.05 % ointment  Apply topically 2 (two) times daily.     hydrochlorothiazide  (MICROZIDE ) 12.5 MG capsule Take 12.5 mg by mouth daily.     losartan  (COZAAR ) 100 MG tablet Take 100 mg by mouth daily.      Melatonin 3 MG CAPS Take 3 mg by mouth at bedtime.     metFORMIN  (GLUCOPHAGE ) 500 MG tablet Take 500 mg by mouth daily with breakfast.      metoprolol  succinate (TOPROL -XL) 100 MG 24 hr tablet Take 100 mg by mouth daily.      Multiple Vitamin tablet Take 1 tablet by mouth daily.     rosuvastatin (CRESTOR) 10 MG tablet TAKE ONE-HALF TABLET BY MOUTH ONCE A DAY FOR CHOLESTEROL     No current facility-administered medications on file prior to visit.    Allergies  Allergen Reactions   Brilinta  [Ticagrelor ] Shortness Of Breath   Epinephrine  Anaphylaxis and Other (See Comments)    Pt states he is anaphyactic     DIAGNOSTIC DATA (LABS, IMAGING, TESTING) - I reviewed patient records, labs, notes, testing and imaging myself where  available.  Lab Results  Component Value Date   WBC 5.3 12/01/2023   HGB 15.2 12/01/2023   HCT 44.0 12/01/2023   MCV 96.3 12/01/2023   PLT 183 12/01/2023      Component Value Date/Time   NA 137 12/01/2023 0823   K 4.0 12/01/2023 0823   CL 102 12/01/2023 0823   CO2 26 12/01/2023 0823   GLUCOSE 173 (H) 12/01/2023 0823   BUN 22 12/01/2023 0823   CREATININE 1.20 12/01/2023 0823   CALCIUM  9.2 12/01/2023 0823   PROT 7.3 05/12/2014 0020   ALBUMIN 3.8 05/12/2014 0020   AST 21 05/12/2014 0020   ALT 24 05/12/2014 0020   ALKPHOS 68 05/12/2014 0020   BILITOT 1.4 (H) 05/12/2014 0020   GFRNONAA >60 12/01/2023 0823   GFRAA NOT CALCULATED 06/06/2018 1020   Lab Results  Component Value Date   CHOL 173 05/09/2014   HDL 36 (L) 05/09/2014   LDLCALC 93 05/09/2014   TRIG 222 (H) 05/09/2014   CHOLHDL 4.8 05/09/2014   Lab Results  Component Value Date   HGBA1C 7.2 (H) 05/06/2014   No results found for: VITAMINB12 No results found for: TSH  PHYSICAL EXAM:  Vitals:    04/10/24 1247  BP: (!) 160/69  Pulse: (!) 55  SpO2: 97%   No data found. Body mass index is 26.73 kg/m.   Wt Readings from Last 3 Encounters:  04/10/24 165 lb 9.6 oz (75.1 kg)  12/01/23 163 lb 8 oz (74.2 kg)  09/14/23 164 lb (74.4 kg)     Ht Readings from Last 3 Encounters:  04/10/24 5' 6 (1.676 m)  12/01/23 5' 6 (1.676 m)  09/14/23 5' 6 (1.676 m)      General: The patient is awake, alert and appears not in acute distress and groomed. Head: Normocephalic, atraumatic.  Neck is supple. Mallampati 3,  neck circumference:16.75 inches .   Nasal airflow  patent.   Overbite Dwan is not seen.  Dental status: biological  Cardiovascular:  Regular rate and cardiac rhythm by pulse, without distended neck veins. Respiratory: no shortness of breath  Skin:  Without evidence of ankle edema, or rash. Trunk: BMI is 26.7    NEUROLOGIC EXAM: Cranial nerves: no loss of smell or taste-  Pupils apppear equal in size, EOM full, no facial asymmetry noted, vision perimetry reportedly intact. symmetric and tongue and uvula move midline. Shoulder shrug was symmetrical.  Tongue in midline.  Impaired hearing.                        ASSESSMENT AND PLAN :   83 y.o. year old male  here with:    1) OSA on Inspire.   2) has osteoarthritic DDD, and cervical pain, ROM limitation- he is in treatment at [facility] spine surgeons and his surgeon was apprehensive about the Inspire device and treatment options; Ant. fusion surgery versus ablation or nerve block .  He even discussed a spinal cord stimulator with the patient , but a second implanted device would not be favored.   I reccommended to have his surgeon discuss this with Dr Carlie or inspire device maker hotline directly.  [phone].   PLAn RV in 12 months.  No HST needed.   I would like to thank Dr Carlie  and Reita Locus, Md [address] [place],  [place] [address] for allowing me to meet with this pleasant  patient.   Sleep Clinic Patients are generally offered  input on sleep hygiene, life style changes and how to improve compliance with medical treatment where applicable. Review and reiteration of good sleep hygiene measures is offered to any sleep clinic patient, be it in the first consultation or with any follow up visits. Any patient with sleepiness should be cautioned not to drive, work at heights, or operate dangerous or heavy equipment when feeling tired or sleepy.    The patient will be seen in follow-up in the sleep clinic at [facility] for discussion of test results, sleep related symptoms and treatment compliance review, further management strategies, etc.   The referring provider will be notified of the test results.   The patient's condition requires frequent monitoring and adjustments in the treatment plan, reflecting the ongoing complexity of care.  This provider is the continuing focal point for all needed services for this condition.  After spending a total time of  30  minutes face to face and time for  history taking, physical and neurologic examination, review of laboratory studies,  personal review of imaging studies, reports and results of other testing and review of referral information / records as far as provided in visit,   Electronically signed by: Dedra Gores, MD 04/10/2024 1:29 PM  [facility] Board certified by The [employer] of Sleep Medicine and Diplomate of the [employer] of Sleep Medicine. Board certified In Neurology through the ABPN, Fellow of the [employer] of Neurology.

## 2024-04-10 NOTE — Telephone Encounter (Signed)
 Patient was seen today for an inspire follow up. No changes were made. Inspire rep Cheron was here for today visit.

## 2024-06-20 ENCOUNTER — Telehealth: Payer: Self-pay | Admitting: *Deleted

## 2024-06-20 NOTE — Telephone Encounter (Signed)
 Received clearance form for RFA, last seen 04-10-2024. In your inbox for review.

## 2024-07-02 NOTE — Telephone Encounter (Signed)
 Clearance signed and faxed back, confirmation received.

## 2024-09-05 ENCOUNTER — Ambulatory Visit: Admitting: Cardiology

## 2025-04-10 ENCOUNTER — Ambulatory Visit: Admitting: Neurology
